# Patient Record
Sex: Male | Born: 1940 | Race: White | Hispanic: No | Marital: Married | Smoking: Former smoker
Health system: Southern US, Community
[De-identification: ages and names within clinical notes are randomized; demographics above are authoritative.]

## PROBLEM LIST (undated history)

## (undated) DIAGNOSIS — M1990 Unspecified osteoarthritis, unspecified site: Secondary | ICD-10-CM

## (undated) DIAGNOSIS — E7800 Pure hypercholesterolemia, unspecified: Secondary | ICD-10-CM

## (undated) DIAGNOSIS — I4891 Unspecified atrial fibrillation: Secondary | ICD-10-CM

## (undated) HISTORY — PX: ROTATOR CUFF REPAIR: SHX139

## (undated) HISTORY — DX: Unspecified osteoarthritis, unspecified site: M19.90

---

## 2000-03-28 ENCOUNTER — Encounter: Payer: Self-pay | Admitting: Family Medicine

## 2000-03-28 ENCOUNTER — Ambulatory Visit (HOSPITAL_COMMUNITY): Admission: RE | Admit: 2000-03-28 | Discharge: 2000-03-28 | Payer: Self-pay | Admitting: Family Medicine

## 2003-04-11 ENCOUNTER — Ambulatory Visit (HOSPITAL_COMMUNITY): Admission: RE | Admit: 2003-04-11 | Discharge: 2003-04-11 | Payer: Self-pay | Admitting: Family Medicine

## 2003-05-06 ENCOUNTER — Emergency Department (HOSPITAL_COMMUNITY): Admission: EM | Admit: 2003-05-06 | Discharge: 2003-05-06 | Payer: Self-pay | Admitting: Emergency Medicine

## 2003-05-16 ENCOUNTER — Ambulatory Visit (HOSPITAL_COMMUNITY): Admission: RE | Admit: 2003-05-16 | Discharge: 2003-05-16 | Payer: Self-pay | Admitting: Cardiology

## 2003-05-23 ENCOUNTER — Ambulatory Visit (HOSPITAL_COMMUNITY): Admission: RE | Admit: 2003-05-23 | Discharge: 2003-05-23 | Payer: Self-pay | Admitting: Cardiology

## 2011-10-23 ENCOUNTER — Encounter (HOSPITAL_COMMUNITY): Payer: Self-pay | Admitting: *Deleted

## 2011-10-23 ENCOUNTER — Emergency Department (HOSPITAL_COMMUNITY): Payer: Medicare Other

## 2011-10-23 ENCOUNTER — Inpatient Hospital Stay (HOSPITAL_COMMUNITY)
Admission: EM | Admit: 2011-10-23 | Discharge: 2011-10-30 | DRG: 329 | Disposition: A | Discharge status: Home Health | Payer: Medicare Other | Attending: Family Medicine | Admitting: Family Medicine

## 2011-10-23 DIAGNOSIS — K658 Other peritonitis: Secondary | ICD-10-CM | POA: Diagnosis present

## 2011-10-23 DIAGNOSIS — R7309 Other abnormal glucose: Secondary | ICD-10-CM | POA: Diagnosis present

## 2011-10-23 DIAGNOSIS — E876 Hypokalemia: Secondary | ICD-10-CM | POA: Diagnosis not present

## 2011-10-23 DIAGNOSIS — Y836 Removal of other organ (partial) (total) as the cause of abnormal reaction of the patient, or of later complication, without mention of misadventure at the time of the procedure: Secondary | ICD-10-CM | POA: Diagnosis not present

## 2011-10-23 DIAGNOSIS — K929 Disease of digestive system, unspecified: Secondary | ICD-10-CM | POA: Diagnosis not present

## 2011-10-23 DIAGNOSIS — K560 Paralytic ileus: Secondary | ICD-10-CM | POA: Diagnosis not present

## 2011-10-23 DIAGNOSIS — N281 Cyst of kidney, acquired: Secondary | ICD-10-CM | POA: Diagnosis present

## 2011-10-23 DIAGNOSIS — R198 Other specified symptoms and signs involving the digestive system and abdomen: Secondary | ICD-10-CM

## 2011-10-23 DIAGNOSIS — M129 Arthropathy, unspecified: Secondary | ICD-10-CM | POA: Diagnosis present

## 2011-10-23 DIAGNOSIS — N200 Calculus of kidney: Secondary | ICD-10-CM | POA: Diagnosis present

## 2011-10-23 DIAGNOSIS — Z7982 Long term (current) use of aspirin: Secondary | ICD-10-CM

## 2011-10-23 DIAGNOSIS — K838 Other specified diseases of biliary tract: Secondary | ICD-10-CM | POA: Diagnosis present

## 2011-10-23 DIAGNOSIS — K5710 Diverticulosis of small intestine without perforation or abscess without bleeding: Principal | ICD-10-CM | POA: Diagnosis present

## 2011-10-23 DIAGNOSIS — E785 Hyperlipidemia, unspecified: Secondary | ICD-10-CM | POA: Diagnosis present

## 2011-10-23 DIAGNOSIS — Z79899 Other long term (current) drug therapy: Secondary | ICD-10-CM

## 2011-10-23 DIAGNOSIS — Z885 Allergy status to narcotic agent status: Secondary | ICD-10-CM

## 2011-10-23 DIAGNOSIS — R69 Illness, unspecified: Secondary | ICD-10-CM | POA: Diagnosis present

## 2011-10-23 DIAGNOSIS — I4891 Unspecified atrial fibrillation: Secondary | ICD-10-CM | POA: Diagnosis present

## 2011-10-23 DIAGNOSIS — A498 Other bacterial infections of unspecified site: Secondary | ICD-10-CM | POA: Diagnosis present

## 2011-10-23 DIAGNOSIS — K529 Noninfective gastroenteritis and colitis, unspecified: Secondary | ICD-10-CM

## 2011-10-23 DIAGNOSIS — R739 Hyperglycemia, unspecified: Secondary | ICD-10-CM | POA: Diagnosis present

## 2011-10-23 HISTORY — DX: Other specified symptoms and signs involving the digestive system and abdomen: R19.8

## 2011-10-23 HISTORY — DX: Pure hypercholesterolemia, unspecified: E78.00

## 2011-10-23 HISTORY — DX: Unspecified atrial fibrillation: I48.91

## 2011-10-23 LAB — BASIC METABOLIC PANEL
BUN: 22 mg/dL (ref 6–23)
CO2: 24 mEq/L (ref 19–32)
Chloride: 101 mEq/L (ref 96–112)
Creatinine, Ser: 0.95 mg/dL (ref 0.50–1.35)
Glucose, Bld: 136 mg/dL — ABNORMAL HIGH (ref 70–99)
Potassium: 3.8 mEq/L (ref 3.5–5.1)

## 2011-10-23 LAB — HEPATIC FUNCTION PANEL
Albumin: 3.5 g/dL (ref 3.5–5.2)
Alkaline Phosphatase: 58 U/L (ref 39–117)
Indirect Bilirubin: 0.9 mg/dL (ref 0.3–0.9)
Total Protein: 6.6 g/dL (ref 6.0–8.3)

## 2011-10-23 LAB — APTT: aPTT: 26 seconds (ref 24–37)

## 2011-10-23 LAB — URINALYSIS, ROUTINE W REFLEX MICROSCOPIC
Bilirubin Urine: NEGATIVE
Glucose, UA: NEGATIVE mg/dL
Hgb urine dipstick: NEGATIVE
Ketones, ur: 40 mg/dL — AB
Nitrite: NEGATIVE
Specific Gravity, Urine: 1.028 (ref 1.005–1.030)
pH: 5 (ref 5.0–8.0)

## 2011-10-23 LAB — LACTIC ACID, PLASMA: Lactic Acid, Venous: 1.4 mmol/L (ref 0.5–2.2)

## 2011-10-23 LAB — CBC WITH DIFFERENTIAL/PLATELET
Basophils Relative: 0 % (ref 0–1)
HCT: 40.7 % (ref 39.0–52.0)
Hemoglobin: 13.8 g/dL (ref 13.0–17.0)
Lymphocytes Relative: 8 % — ABNORMAL LOW (ref 12–46)
Lymphs Abs: 0.7 10*3/uL (ref 0.7–4.0)
MCHC: 33.9 g/dL (ref 30.0–36.0)
Monocytes Absolute: 0.6 10*3/uL (ref 0.1–1.0)
Monocytes Relative: 7 % (ref 3–12)
Neutro Abs: 7.4 10*3/uL (ref 1.7–7.7)
Neutrophils Relative %: 85 % — ABNORMAL HIGH (ref 43–77)
RBC: 4.65 MIL/uL (ref 4.22–5.81)

## 2011-10-23 LAB — PROTIME-INR
INR: 1.05 (ref 0.00–1.49)
Prothrombin Time: 13.9 seconds (ref 11.6–15.2)

## 2011-10-23 MED ORDER — ACETAMINOPHEN 325 MG PO TABS: 650.0000 mg | ORAL_TABLET | Freq: Four times a day (QID) | ORAL | Status: DC | PRN

## 2011-10-23 MED ORDER — ACETAMINOPHEN 650 MG RE SUPP: 650.0000 mg | Freq: Four times a day (QID) | RECTAL | Status: DC | PRN

## 2011-10-23 MED ORDER — METRONIDAZOLE IN NACL 5-0.79 MG/ML-% IV SOLN
500.0000 mg | Freq: Once | INTRAVENOUS | Status: AC
  Administered 2011-10-23: 500 mg via INTRAVENOUS
  Filled 2011-10-23: qty 100

## 2011-10-23 MED ORDER — SODIUM CHLORIDE 0.9 % IJ SOLN
3.0000 mL | Freq: Two times a day (BID) | INTRAMUSCULAR | Status: DC
  Administered 2011-10-27 – 2011-10-29 (×5): 3 mL via INTRAVENOUS

## 2011-10-23 MED ORDER — HYDROMORPHONE HCL PF 1 MG/ML IJ SOLN
1.0000 mg | Freq: Once | INTRAMUSCULAR | Status: AC
  Administered 2011-10-23: 1 mg via INTRAVENOUS
  Filled 2011-10-23: qty 1

## 2011-10-23 MED ORDER — METOPROLOL TARTRATE 1 MG/ML IV SOLN: 5.0000 mg | INTRAVENOUS | Status: DC | PRN

## 2011-10-23 MED ORDER — ONDANSETRON HCL 4 MG/2ML IJ SOLN: 4.0000 mg | Freq: Three times a day (TID) | INTRAMUSCULAR | Status: DC | PRN

## 2011-10-23 MED ORDER — PIPERACILLIN-TAZOBACTAM 3.375 G IVPB
3.3750 g | Freq: Three times a day (TID) | INTRAVENOUS | Status: DC
  Administered 2011-10-24 – 2011-10-27 (×11): 3.375 g via INTRAVENOUS
  Filled 2011-10-23 (×13): qty 50

## 2011-10-23 MED ORDER — HYDROMORPHONE HCL PF 1 MG/ML IJ SOLN: 1.0000 mg | INTRAMUSCULAR | Status: DC | PRN

## 2011-10-23 MED ORDER — PANTOPRAZOLE SODIUM 40 MG IV SOLR
40.0000 mg | INTRAVENOUS | Status: DC
  Administered 2011-10-24 – 2011-10-30 (×6): 40 mg via INTRAVENOUS
  Filled 2011-10-23 (×8): qty 40

## 2011-10-23 MED ORDER — SODIUM CHLORIDE 0.9 % IV SOLN: Freq: Once | INTRAVENOUS | Status: DC

## 2011-10-23 MED ORDER — SODIUM CHLORIDE 0.9 % IV SOLN
INTRAVENOUS | Status: DC
  Administered 2011-10-24: 01:00:00 via INTRAVENOUS

## 2011-10-23 MED ORDER — CIPROFLOXACIN IN D5W 400 MG/200ML IV SOLN
400.0000 mg | Freq: Once | INTRAVENOUS | Status: DC
  Filled 2011-10-23: qty 200

## 2011-10-23 MED ORDER — ONDANSETRON HCL 4 MG/2ML IJ SOLN: 4.0000 mg | Freq: Four times a day (QID) | INTRAMUSCULAR | Status: DC | PRN

## 2011-10-23 MED ORDER — ONDANSETRON HCL 4 MG PO TABS: 4.0000 mg | ORAL_TABLET | Freq: Four times a day (QID) | ORAL | Status: DC | PRN

## 2011-10-23 MED ORDER — LABETALOL HCL 5 MG/ML IV SOLN
10.0000 mg | INTRAVENOUS | Status: DC | PRN
  Filled 2011-10-23: qty 4

## 2011-10-23 MED ORDER — SODIUM CHLORIDE 0.9 % IV SOLN
Freq: Once | INTRAVENOUS | Status: AC
  Administered 2011-10-23: 150 mL/h via INTRAVENOUS

## 2011-10-23 MED ORDER — SODIUM CHLORIDE 0.9 % IV SOLN: INTRAVENOUS | Status: DC

## 2011-10-23 NOTE — ED Provider Notes (Addendum)
History     CSN: [id number]  Arrival date & time 10/23/11  1438   First MD Initiated Contact with Patient 10/23/11 1703      Chief Complaint  Patient presents with  . Abdominal Pain    generalized, radiating to groin, along with back pain    (Consider location/radiation/quality/duration/timing/severity/associated sxs/prior treatment) HPI Comments: 71 y/o with hx of afib, not on anticoagulants comes in with cc of abd pain. He has no hx of abd surgery. Pt reports having abd pain, sudden onset this morning that has worsened over time. The pain is diffuse, but primarily in the lower quadrants and radiating to the groin. There is associated nausea, no emesis and no diarrhea with the last BM was this am. Pt has no bloody stools. No chest pain, sob.  Patient is a 71 y.o. male presenting with abdominal pain. The history is provided by the patient.  Abdominal Pain The primary symptoms of the illness include abdominal pain, nausea and vomiting. The primary symptoms of the illness do not include fever, shortness of breath, diarrhea or dysuria.  Symptoms associated with the illness do not include chills or constipation.    Past Medical History  Diagnosis Date  . Afib   . Hypercholesteremia     No past surgical history on file.  No family history on file.  History  Substance Use Topics  . Smoking status: Not on file  . Smokeless tobacco: Not on file  . Alcohol Use:       Review of Systems  Constitutional: Negative for fever, chills, activity change and appetite change.  HENT: Negative for neck pain.   Eyes: Negative for visual disturbance.  Respiratory: Negative for cough, chest tightness and shortness of breath.   Cardiovascular: Negative for chest pain.  Gastrointestinal: Positive for nausea, vomiting and abdominal pain. Negative for diarrhea, constipation, blood in stool, abdominal distention and rectal pain.  Genitourinary: Negative for dysuria, enuresis and difficulty  urinating.  Musculoskeletal: Negative for arthralgias.  Neurological: Negative for dizziness, light-headedness and headaches.  Psychiatric/Behavioral: Negative for confusion.    Allergies  Codeine  Home Medications   Current Outpatient Rx  Name Route Sig Dispense Refill  . ASPIRIN EC 81 MG PO TBEC Oral Take 81 mg by mouth daily.    . CYCLOBENZAPRINE HCL 10 MG PO TABS Oral Take 10 mg by mouth as needed.    Marland Kitchen METOPROLOL TARTRATE 50 MG PO TABS Oral Take 25 mg by mouth 2 (two) times daily. Takes 1/2 tablet    . NAPROXEN SODIUM 550 MG PO TABS Oral Take 550-1,100 mg by mouth daily.    Marland Kitchen SIMVASTATIN 40 MG PO TABS Oral Take 40 mg by mouth every evening.    Marland Kitchen TAMSULOSIN HCL 0.4 MG PO CAPS Oral Take 0.4 mg by mouth every evening.    Marland Kitchen TRAMADOL HCL 50 MG PO TABS Oral Take 50 mg by mouth every 6 (six) hours as needed. pain      BP 117/70  Pulse 77  Temp 97.4 F (36.3 C) (Oral)  Resp 25  SpO2 95%  Physical Exam  Nursing note and vitals reviewed. Constitutional: He is oriented to person, place, and time. He appears well-developed.  HENT:  Head: Normocephalic and atraumatic.  Eyes: Conjunctivae normal and EOM are normal. Pupils are equal, round, and reactive to light.  Neck: Normal range of motion. Neck supple.  Cardiovascular: Normal rate, regular rhythm and normal heart sounds.   Pulmonary/Chest: Effort normal and breath sounds normal. No  respiratory distress. He has no wheezes.  Abdominal: Bowel sounds are normal. He exhibits distension. He exhibits no mass. There is tenderness. There is guarding. There is no rebound.       Diffuse tenderness, with + mcburney's and positive guarding, no rebound. Firm abdomen  Genitourinary: Penis normal.       No scrotal swelling, rash, no significant tenderness to palpation of the testicle and no testicular masses, no hernia.  Neurological: He is alert and oriented to person, place, and time.  Skin: Skin is warm.    ED Course  Procedures  (including critical care time)  Labs Reviewed  CBC WITH DIFFERENTIAL - Abnormal; Notable for the following:    Neutrophils Relative 85 (*)     Lymphocytes Relative 8 (*)     All other components within normal limits  BASIC METABOLIC PANEL - Abnormal; Notable for the following:    Glucose, Bld 136 (*)     GFR calc non Af Amer 82 (*)     All other components within normal limits  TROPONIN I  PROTIME-INR  APTT  LIPASE, BLOOD  HEPATIC FUNCTION PANEL  URINALYSIS, ROUTINE W REFLEX MICROSCOPIC   Dg Abd Acute W/chest  10/23/2011  *RADIOLOGY REPORT*  Clinical Data: Fever.  Abdominal pain radiating towards the groin.  ACUTE ABDOMEN SERIES (ABDOMEN 2 VIEW & CHEST 1 VIEW)  Comparison: 04/11/2003  Findings: On the chest radiograph, right retro cardiac density favors hiatal hernia.  Tortuous thoracic aorta noted.  There is suspected subsegmental atelectasis at the left lung base.  Faint linear edge noted along the right hemidiaphragm.  Although the appearance is not classic for free intraperitoneal gas, intraperitoneal gas becomes difficult to completely exclude given this appearance.  Accordingly, I recommend either a left side down lateral decubitus view the abdomen or abdomen CT for further characterization.  Gas and stool noted in the colon several air-fluid levels are present in nondilated central abdominal small bowel, nonspecific.  IMPRESSION:  1.  Several nonspecific air-fluid levels and central abdominal loops of nondilated small bowel, query ileus. 2. Faint linear structure extending along the right hemidiaphragm is probably due to clothing and seems too thin to represent the right hemidiaphragm in the setting of free intraperitoneal gas. However, I do recommend a left side down lateral decubitus view of the abdomen for further confirmation that there is no free intraperitoneal gas.  Alternatively, abdomen CT could be utilized. 3.  Retrocardiac density favors hiatal hernia. 4.  Linear subsegmental  atelectasis in the left lower lobe.   Original Report Authenticated By: Dellia Cloud, M.D.      No diagnosis found.    MDM  DDx includes: Pancreatitis Gastritis/PUD SBO ACS syndrome Aortic Dissection Colitis AAA Tumors Colitis Intra abdominal abscess Thrombosis Mesenteric ischemia Diverticulitis Peritonitis Appendicitis Hernia Nephrolithiasis Pyelonephritis UTI/Cystitis  Pt comes in with cc of abd pain. Pt has significant abd tenderness, mostly diffuse, but worst over the RLQ and around the groin area. There is no CVA tenderness.  Initial impression is that patient has perforated viscus, appendicitis, renal stones.  9:32 PM AAS was equivocal, I reassessed the patient, and he was feeling slightly better, and with the groin pain i had a CT with and w/o contrast done as he has hx of renal stones few yrs ago and the groin pain reminded of the renal stones. The Radiologist called [place] stating that there is some free air, and possibly ischhemic bowel. Mesenteric ischemia, especially since he has afib is very high on  the ddx now. i have added lactate to the labs, antibiotics and surgery has been consulted.    Derwood Kaplan, MD 10/23/11 2135  9:59 PM Surgeon (Dr. Daphine Deutscher) on call just called back. Advocating IV AB and hydration with med surg admission, and Surgery on consult. Patient's family notified.      CRITICAL CARE Performed by: Derwood Kaplan   Total critical care time: 62 min  Critical care time was exclusive of separately billable procedures and treating other patients.  Critical care was necessary to treat or prevent imminent or life-threatening deterioration.  Critical care was time spent personally by me on the following activities: development of treatment plan with patient and/or surrogate as well as nursing, discussions with consultants, evaluation of patient's response to treatment, examination of patient, obtaining history from patient  or surrogate, ordering and performing treatments and interventions, ordering and review of laboratory studies, ordering and review of radiographic studies, pulse oximetry and re-evaluation of patient's condition.  Derwood Kaplan, MD 10/23/11 1610  Derwood Kaplan, MD 10/23/11 2222

## 2011-10-23 NOTE — Progress Notes (Signed)
ANTIBIOTIC CONSULT NOTE - INITIAL  Pharmacy Consult for Zosyn Indication: Perforated viscus  Allergies  Allergen Reactions  . Codeine Nausea Only    Patient Measurements: Height: 5\' 9"  (175.3 cm) Weight: 195 lb 8.8 oz (88.7 kg) IBW/kg (Calculated) : 70.7    Vital Signs: Temp: 99.5 F (37.5 C) (09/15 2333) Temp src: Oral (09/15 2333) BP: 132/76 mmHg (09/15 2333) Pulse Rate: 96  (09/15 2333) Intake/Output from previous day:   Intake/Output from this shift:    Labs:  [facility] 10/23/11 1755  WBC 8.7  HGB 13.8  PLT 166  LABCREA --  CREATININE 0.95   Estimated Creatinine Clearance: 79.7 ml/min (by C-G formula based on Cr of 0.95). No results found for this basename: VANCOTROUGH:2,VANCOPEAK:2,VANCORANDOM:2,GENTTROUGH:2,GENTPEAK:2,GENTRANDOM:2,TOBRATROUGH:2,TOBRAPEAK:2,TOBRARND:2,AMIKACINPEAK:2,AMIKACINTROU:2,AMIKACIN:2, in the last 72 hours   Microbiology: No results found for this or any previous visit (from the past 720 hour(s)).  Medical History: Past Medical History  Diagnosis Date  . Afib   . Hypercholesteremia     Medications:  Scheduled:    . sodium chloride   Intravenous Once  . sodium chloride   Intravenous STAT  . ciprofloxacin  400 mg Intravenous Once  .  HYDROmorphone (DILAUDID) injection  1 mg Intravenous Once  .  HYDROmorphone (DILAUDID) injection  1 mg Intravenous Once  . metronidazole  500 mg Intravenous Once  . pantoprazole (PROTONIX) IV  40 mg Intravenous Q24H  . sodium chloride  3 mL Intravenous Q12H  . DISCONTD: sodium chloride   Intravenous Once   Infusions:    . sodium chloride     Assessment:  71 year old male with complaint of abdominal pain  Abdomen pelvis CT shows suspected perforated viscus  Patient has received Flagyl 500mg  IV x 1 in ED @ 22:16  Zosyn ordered empirically for perforated viscus  Surgery has been consulted  Goal of Therapy:  Eradication of suspected infection  Plan:  Zosyn 3.375gm IV q8h (each dose  infused over 4 hrs) Follow renal function, clinical course,  Makenah Karas, Joselyn Glassman, PharmD 10/23/2011,11:51 PM

## 2011-10-23 NOTE — ED Notes (Signed)
WUJ:[id number]<JY> Expected date:10/23/11<BR> Expected time: 2:20 PM<BR> Means of arrival:Ambulance<BR> Comments:<BR> EMS

## 2011-10-23 NOTE — H&P (Signed)
Michael Mejia is an 71 y.o. male. Patient was seen and examined on October 23, 2011 at 11:20 PM.  PCP - Dr. Elias Else.  Cardiologist  - Dr. Viann Fish.  Chief Complaint: Abdominal pain.  HPI: 71 year-old male with history of atrial fibrillation, hyperlipidemia has been experiencing abdominal pain since today morning 10:00. Patient's pain started as a scrotal pain which became more generalized with nausea but no vomiting or diarrhea. 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 Over the last 6 months patient has been having increasing bowel movements at least twice a day after food. Denies any chest pain or shortness of breath. In the ER patient had a CT abdomen pelvis which shows possibility of a perforated viscus and patient has been admitted for further management.  Past Medical History  Diagnosis Date  . Afib   . Hypercholesteremia     Past Surgical History  Procedure Date  . Rotator cuff repair     Family History  Problem Relation Age of Onset  . Pancreatic cancer Mother   . Pancreatic cancer Brother    Social History:  reports that he has never smoked. He does not have any smokeless tobacco history on file. He reports that he does not drink alcohol or use illicit drugs.  Allergies:  Allergies  Allergen Reactions  . Codeine Nausea Only    Medications Prior to Admission  Medication Sig Dispense Refill  . aspirin EC 81 MG tablet Take 81 mg by mouth daily.      . cyclobenzaprine (FLEXERIL) 10 MG tablet Take 10 mg by mouth as needed.      . metoprolol (LOPRESSOR) 50 MG tablet Take 25 mg by mouth 2 (two) times daily. Takes 1/2 tablet      . naproxen sodium (ANAPROX) 550 MG tablet Take 550-1,100 mg by mouth daily.      . simvastatin (ZOCOR) 40 MG tablet Take 40 mg by mouth every evening.      . Tamsulosin HCl (FLOMAX) 0.4 MG CAPS Take 0.4 mg by mouth every evening.      . traMADol (ULTRAM) 50 MG tablet Take 50 mg by mouth every 6  (six) hours as needed. pain        Results for orders placed during the hospital encounter of 10/23/11 (from the past 48 hour(s))  CBC WITH DIFFERENTIAL     Status: Abnormal   Collection Time   10/23/11  5:55 PM      Component Value Range Comment   WBC 8.7  4.0 - 10.5 K/uL    RBC 4.65  4.22 - 5.81 MIL/uL    Hemoglobin 13.8  13.0 - 17.0 g/dL    HCT 40.9  81.1 - 91.4 %    MCV 87.5  78.0 - 100.0 fL    MCH 29.7  26.0 - 34.0 pg    MCHC 33.9  30.0 - 36.0 g/dL    RDW 78.2  95.6 - 21.3 %    Platelets 166  150 - 400 K/uL    Neutrophils Relative 85 (*) 43 - 77 %    Neutro Abs 7.4  1.7 - 7.7 K/uL    Lymphocytes Relative 8 (*) 12 - 46 %    Lymphs Abs 0.7  0.7 - 4.0 K/uL    Monocytes Relative 7  3 - 12 %    Monocytes Absolute 0.6  0.1 - 1.0 K/uL    Eosinophils Relative 0  0 - 5 %  Eosinophils Absolute 0.0  0.0 - 0.7 K/uL    Basophils Relative 0  0 - 1 %    Basophils Absolute 0.0  0.0 - 0.1 K/uL   BASIC METABOLIC PANEL     Status: Abnormal   Collection Time   10/23/11  5:55 PM      Component Value Range Comment   Sodium 136  135 - 145 mEq/L    Potassium 3.8  3.5 - 5.1 mEq/L    Chloride 101  96 - 112 mEq/L    CO2 24  19 - 32 mEq/L    Glucose, Bld 136 (*) 70 - 99 mg/dL    BUN 22  6 - 23 mg/dL    Creatinine, Ser 1.61  0.50 - 1.35 mg/dL    Calcium 8.9  8.4 - 09.6 mg/dL    GFR calc non Af Amer 82 (*) >90 mL/min    GFR calc Af Amer >90  >90 mL/min   TROPONIN I     Status: Normal   Collection Time   10/23/11  5:55 PM      Component Value Range Comment   Troponin I <0.30  <0.30 ng/mL   PROTIME-INR     Status: Normal   Collection Time   10/23/11  5:55 PM      Component Value Range Comment   Prothrombin Time 13.9  11.6 - 15.2 seconds    INR 1.05  0.00 - 1.49   APTT     Status: Normal   Collection Time   10/23/11  5:55 PM      Component Value Range Comment   aPTT 26  24 - 37 seconds   LIPASE, BLOOD     Status: Normal   Collection Time   10/23/11  5:55 PM      Component Value Range  Comment   Lipase 41  11 - 59 U/L   HEPATIC FUNCTION PANEL     Status: Normal   Collection Time   10/23/11  5:55 PM      Component Value Range Comment   Total Protein 6.6  6.0 - 8.3 g/dL    Albumin 3.5  3.5 - 5.2 g/dL    AST 18  0 - 37 U/L    ALT 10  0 - 53 U/L    Alkaline Phosphatase 58  39 - 117 U/L    Total Bilirubin 1.1  0.3 - 1.2 mg/dL    Bilirubin, Direct 0.2  0.0 - 0.3 mg/dL    Indirect Bilirubin 0.9  0.3 - 0.9 mg/dL   URINALYSIS, ROUTINE W REFLEX MICROSCOPIC     Status: Abnormal   Collection Time   10/23/11  7:40 PM      Component Value Range Comment   Color, Urine YELLOW  YELLOW    APPearance CLOUDY (*) CLEAR    Specific Gravity, Urine 1.028  1.005 - 1.030    pH 5.0  5.0 - 8.0    Glucose, UA NEGATIVE  NEGATIVE mg/dL    Hgb urine dipstick NEGATIVE  NEGATIVE    Bilirubin Urine NEGATIVE  NEGATIVE    Ketones, ur 40 (*) NEGATIVE mg/dL    Protein, ur NEGATIVE  NEGATIVE mg/dL    Urobilinogen, UA 0.2  0.0 - 1.0 mg/dL    Nitrite NEGATIVE  NEGATIVE    Leukocytes, UA NEGATIVE  NEGATIVE MICROSCOPIC NOT DONE ON URINES WITH NEGATIVE PROTEIN, BLOOD, LEUKOCYTES, NITRITE, OR GLUCOSE <1000 mg/dL.  LACTIC ACID, PLASMA     Status: Normal  Collection Time   10/23/11 10:00 PM      Component Value Range Comment   Lactic Acid, Venous 1.4  0.5 - 2.2 mmol/L    Ct Abdomen Pelvis Wo Contrast  10/23/2011  *RADIOLOGY REPORT*  Clinical Data: Right lower quadrant pain.  CT ABDOMEN AND PELVIS WITHOUT CONTRAST  Technique:  Multidetector CT imaging of the abdomen and pelvis was performed following the standard protocol without intravenous contrast.  Comparison: Chest and two views abdomen 10/23/2011 at 1804 hours.  Findings: Lung bases demonstrate some dependent atelectatic change. No pleural or pericardial effusion.  There is cardiomegaly.  Oral contrast material in the distal esophagus could be due to poor motility and/or reflux.  There is wall thickening and inflammatory change about a loop of jejunum in  the midline immediately anterior to the right common iliac artery.  There are a tiny locules of free air adjacent to this abnormal loop of small bowel.  There is some free fluid present.  The no pneumatosis or portal venous gas is identified. No focal fluid collection is identified.  There appear be some tiny gravel type stones layering dependently within the gallbladder.  The gallbladder is otherwise unremarkable. The liver, spleen, adrenal glands and pancreas appear normal.  A punctate nonobstructing stone is identified in the lower pole of the right kidney.  There is an exophytic lesion off the midpole of the left kidney measuring 1.9 cm with Hounsfield unit measurements of 22.2 which cannot be definitively characterized.  An additional smaller intraparenchymal low attenuating lesion is seen in the left kidney which also cannot be characterized.  The colon and appendix appear normal.  IMPRESSION:  1.  Free air within the abdomen with fluid present compatible with bowel perforation.  Free air is centered about a loop of jejunum which has thickened walls.  Differential considerations include inflammatory change and ischemia. 2.  Indeterminate lesion lower pole left kidney.  Non emergent renal ultrasound recommended for further characterization. 3.  Punctate nonobstructing stone lower pole right kidney. 4.  Likely gravel type gallstones without evidence of cholecystitis.  Critical Value/emergent results were called by telephone at the time of interpretation on 10/23/2011 at 9:20 p.m. to Dr. Rhunette Croft, who verbally acknowledged these results.   Original Report Authenticated By: Bernadene Bell. D'ALESSIO, M.D.    Dg Abd Acute W/chest  10/23/2011  *RADIOLOGY REPORT*  Clinical Data: Fever.  Abdominal pain radiating towards the groin.  ACUTE ABDOMEN SERIES (ABDOMEN 2 VIEW & CHEST 1 VIEW)  Comparison: 04/11/2003  Findings: On the chest radiograph, right retro cardiac density favors hiatal hernia.  Tortuous thoracic aorta  noted.  There is suspected subsegmental atelectasis at the left lung base.  Faint linear edge noted along the right hemidiaphragm.  Although the appearance is not classic for free intraperitoneal gas, intraperitoneal gas becomes difficult to completely exclude given this appearance.  Accordingly, I recommend either a left side down lateral decubitus view the abdomen or abdomen CT for further characterization.  Gas and stool noted in the colon several air-fluid levels are present in nondilated central abdominal small bowel, nonspecific.  IMPRESSION:  1.  Several nonspecific air-fluid levels and central abdominal loops of nondilated small bowel, query ileus. 2. Faint linear structure extending along the right hemidiaphragm is probably due to clothing and seems too thin to represent the right hemidiaphragm in the setting of free intraperitoneal gas. However, I do recommend a left side down lateral decubitus view of the abdomen for further confirmation that there is no free  intraperitoneal gas.  Alternatively, abdomen CT could be utilized. 3.  Retrocardiac density favors hiatal hernia. 4.  Linear subsegmental atelectasis in the left lower lobe.   Original Report Authenticated By: Dellia Cloud, M.D.     Review of Systems  Constitutional: Negative.   HENT: Negative.   Eyes: Negative.   Respiratory: Negative.   Cardiovascular: Negative.   Gastrointestinal: Positive for nausea and abdominal pain.  Genitourinary: Negative.   Musculoskeletal: Negative.   Skin: Negative.   Neurological: Negative.   Endo/Heme/Allergies: Negative.   Psychiatric/Behavioral: Negative.     Blood pressure 132/76, pulse 96, temperature 99.5 F (37.5 C), temperature source Oral, resp. rate 22, height 5\' 9"  (1.753 m), weight 88.7 kg (195 lb 8.8 oz), SpO2 93.00%. Physical Exam  Constitutional: He is oriented to person, place, and time. He appears well-developed and well-nourished. No distress.  HENT:  Head: Normocephalic  and atraumatic.  Right Ear: External ear normal.  Left Ear: External ear normal.  Mouth/Throat: No oropharyngeal exudate.  Eyes: Conjunctivae normal are normal. Pupils are equal, round, and reactive to light. Right eye exhibits no discharge. Left eye exhibits no discharge. No scleral icterus.  Neck: Normal range of motion. Neck supple.  Cardiovascular: Normal rate and regular rhythm.   Respiratory: Effort normal and breath sounds normal. No respiratory distress. He has no wheezes. He has no rales.  GI: He exhibits no distension. There is tenderness. There is guarding.       Rigid.  Musculoskeletal: Normal range of motion. He exhibits no edema and no tenderness.  Neurological: He is alert and oriented to person, place, and time.       Moves all extremities.  Skin: He is not diaphoretic.     Assessment/Plan #1. Abdominal pain CAT scan showing possibly a perforated viscus - patient will be kept n.p.o. Surgery on call Dr. Daphine Deutscher has been consulted. Patient will be kept on empiric antibiotics. IV fluids and pain in his medications. Further recommendations per surgery. #2. History of atrial fibrillation presently rate controlled - patient will be placed on when necessary metoprolol for heart rate more than 110. #3. History of hyperlipidemia - hold statins for now. #4. Renal stones and indeterminant left kidney lesion with gallbladder sludge - check abdominal sonogram. UA is unremarkable.  CODE STATUS - full code.  Eduard Clos. 10/23/2011, 11:44 PM

## 2011-10-23 NOTE — ED Notes (Signed)
Per [facility], generalized abdominal pain 8/10, radiating to groin, along with back pain. Per report, went to Urgent Care and staff on site called EMS. Pain worsening with palpation and with movement. Pt was nauseous and diaphoretic on ambulance ride.

## 2011-10-23 NOTE — ED Notes (Signed)
Attempt to call report x 1 to [facility] [address]

## 2011-10-24 ENCOUNTER — Inpatient Hospital Stay (HOSPITAL_COMMUNITY): Payer: Medicare Other

## 2011-10-24 ENCOUNTER — Inpatient Hospital Stay (HOSPITAL_COMMUNITY): Payer: Medicare Other | Admitting: Anesthesiology

## 2011-10-24 ENCOUNTER — Encounter (HOSPITAL_COMMUNITY)
Admission: EM | Disposition: A | Discharge status: Home Health | Payer: Self-pay | Source: Home / Self Care | Attending: Family Medicine

## 2011-10-24 ENCOUNTER — Encounter (HOSPITAL_COMMUNITY): Payer: Self-pay | Admitting: Anesthesiology

## 2011-10-24 DIAGNOSIS — K5289 Other specified noninfective gastroenteritis and colitis: Secondary | ICD-10-CM

## 2011-10-24 DIAGNOSIS — I4891 Unspecified atrial fibrillation: Secondary | ICD-10-CM

## 2011-10-24 DIAGNOSIS — K631 Perforation of intestine (nontraumatic): Secondary | ICD-10-CM

## 2011-10-24 DIAGNOSIS — R109 Unspecified abdominal pain: Secondary | ICD-10-CM

## 2011-10-24 DIAGNOSIS — N200 Calculus of kidney: Secondary | ICD-10-CM

## 2011-10-24 HISTORY — PX: LAPAROTOMY: SHX154

## 2011-10-24 HISTORY — PX: BOWEL RESECTION: SHX1257

## 2011-10-24 LAB — COMPREHENSIVE METABOLIC PANEL
ALT: 8 U/L (ref 0–53)
Alkaline Phosphatase: 44 U/L (ref 39–117)
BUN: 20 mg/dL (ref 6–23)
CO2: 24 mEq/L (ref 19–32)
Chloride: 102 mEq/L (ref 96–112)
GFR calc Af Amer: 85 mL/min — ABNORMAL LOW (ref >90)
GFR calc non Af Amer: 73 mL/min — ABNORMAL LOW (ref >90)
Glucose, Bld: 146 mg/dL — ABNORMAL HIGH (ref 70–99)
Potassium: 3.9 mEq/L (ref 3.5–5.1)
Sodium: 136 mEq/L (ref 135–145)
Total Bilirubin: 1.6 mg/dL — ABNORMAL HIGH (ref 0.3–1.2)

## 2011-10-24 LAB — CBC WITH DIFFERENTIAL/PLATELET
Basophils Absolute: 0 10*3/uL (ref 0.0–0.1)
Eosinophils Absolute: 0 10*3/uL (ref 0.0–0.7)
HCT: 38.5 % — ABNORMAL LOW (ref 39.0–52.0)
Lymphocytes Relative: 10 % — ABNORMAL LOW (ref 12–46)
MCHC: 33.5 g/dL (ref 30.0–36.0)
Monocytes Relative: 7 % (ref 3–12)
Neutro Abs: 5.4 10*3/uL (ref 1.7–7.7)
Platelets: 141 10*3/uL — ABNORMAL LOW (ref 150–400)
RDW: 14 % (ref 11.5–15.5)
WBC: 6.6 10*3/uL (ref 4.0–10.5)

## 2011-10-24 LAB — GLUCOSE, CAPILLARY
Glucose-Capillary: 181 mg/dL — ABNORMAL HIGH (ref 70–99)
Glucose-Capillary: 150 mg/dL — ABNORMAL HIGH (ref 70–99)

## 2011-10-24 SURGERY — LAPAROTOMY, EXPLORATORY
Anesthesia: General | Site: Abdomen | Wound class: Dirty or Infected

## 2011-10-24 MED ORDER — FENTANYL CITRATE 0.05 MG/ML IJ SOLN
INTRAMUSCULAR | Status: DC | PRN
  Administered 2011-10-24: 100 ug via INTRAVENOUS
  Administered 2011-10-24: 25 ug via INTRAVENOUS
  Administered 2011-10-24 (×2): 50 ug via INTRAVENOUS
  Administered 2011-10-24: 25 ug via INTRAVENOUS

## 2011-10-24 MED ORDER — HEPARIN SODIUM (PORCINE) 5000 UNIT/ML IJ SOLN
5000.0000 [IU] | Freq: Three times a day (TID) | INTRAMUSCULAR | Status: DC
  Administered 2011-10-24 – 2011-10-30 (×18): 5000 [IU] via SUBCUTANEOUS
  Filled 2011-10-24 (×21): qty 1

## 2011-10-24 MED ORDER — OXYCODONE HCL 5 MG/5ML PO SOLN: 5.0000 mg | Freq: Once | ORAL | Status: DC | PRN

## 2011-10-24 MED ORDER — 0.9 % SODIUM CHLORIDE (POUR BTL) OPTIME
TOPICAL | Status: DC | PRN
  Administered 2011-10-24: 4000 mL

## 2011-10-24 MED ORDER — GLYCOPYRROLATE 0.2 MG/ML IJ SOLN
INTRAMUSCULAR | Status: DC | PRN
  Administered 2011-10-24: .5 mg via INTRAVENOUS

## 2011-10-24 MED ORDER — DIPHENHYDRAMINE HCL 50 MG/ML IJ SOLN: 12.5000 mg | Freq: Four times a day (QID) | INTRAMUSCULAR | Status: DC | PRN

## 2011-10-24 MED ORDER — ROCURONIUM BROMIDE 100 MG/10ML IV SOLN
INTRAVENOUS | Status: DC | PRN
  Administered 2011-10-24: 20 mg via INTRAVENOUS

## 2011-10-24 MED ORDER — NALOXONE HCL 0.4 MG/ML IJ SOLN: 0.4000 mg | INTRAMUSCULAR | Status: DC | PRN

## 2011-10-24 MED ORDER — OXYCODONE HCL 5 MG PO TABS: 5.0000 mg | ORAL_TABLET | Freq: Once | ORAL | Status: AC | PRN

## 2011-10-24 MED ORDER — SUCCINYLCHOLINE CHLORIDE 20 MG/ML IJ SOLN
INTRAMUSCULAR | Status: DC | PRN
  Administered 2011-10-24: 100 mg via INTRAVENOUS

## 2011-10-24 MED ORDER — SODIUM CHLORIDE 0.9 % IJ SOLN: 9.0000 mL | INTRAMUSCULAR | Status: DC | PRN

## 2011-10-24 MED ORDER — MORPHINE SULFATE (PF) 1 MG/ML IV SOLN
INTRAVENOUS | Status: DC
  Administered 2011-10-24 (×2): via INTRAVENOUS
  Filled 2011-10-24: qty 25

## 2011-10-24 MED ORDER — KCL IN DEXTROSE-NACL 20-5-0.45 MEQ/L-%-% IV SOLN
INTRAVENOUS | Status: AC
  Administered 2011-10-24 – 2011-10-25 (×2): via INTRAVENOUS
  Filled 2011-10-24 (×3): qty 1000

## 2011-10-24 MED ORDER — METOPROLOL TARTRATE 1 MG/ML IV SOLN: 5.0000 mg | INTRAVENOUS | Status: DC | PRN

## 2011-10-24 MED ORDER — ONDANSETRON HCL 4 MG/2ML IJ SOLN
INTRAMUSCULAR | Status: DC | PRN
  Administered 2011-10-24: 4 mg via INTRAVENOUS

## 2011-10-24 MED ORDER — NEOSTIGMINE METHYLSULFATE 1 MG/ML IJ SOLN
INTRAMUSCULAR | Status: DC | PRN
  Administered 2011-10-24: 4 mg via INTRAVENOUS

## 2011-10-24 MED ORDER — HYDROMORPHONE HCL PF 1 MG/ML IJ SOLN: 0.2500 mg | INTRAMUSCULAR | Status: DC | PRN

## 2011-10-24 MED ORDER — MEPERIDINE HCL 25 MG/ML IJ SOLN: 6.2500 mg | INTRAMUSCULAR | Status: DC | PRN

## 2011-10-24 MED ORDER — PROMETHAZINE HCL 25 MG/ML IJ SOLN: 6.2500 mg | INTRAMUSCULAR | Status: DC | PRN

## 2011-10-24 MED ORDER — KCL IN DEXTROSE-NACL 20-5-0.45 MEQ/L-%-% IV SOLN
INTRAVENOUS | Status: DC
  Administered 2011-10-24: 05:00:00 via INTRAVENOUS
  Filled 2011-10-24 (×2): qty 1000

## 2011-10-24 MED ORDER — PHENYLEPHRINE HCL 10 MG/ML IJ SOLN
INTRAMUSCULAR | Status: DC | PRN
  Administered 2011-10-24: 100 ug via INTRAVENOUS

## 2011-10-24 MED ORDER — LACTATED RINGERS IV SOLN
INTRAVENOUS | Status: DC | PRN
  Administered 2011-10-24: 02:00:00 via INTRAVENOUS

## 2011-10-24 MED ORDER — MORPHINE SULFATE (PF) 1 MG/ML IV SOLN
INTRAVENOUS | Status: AC
  Administered 2011-10-24: 20 mg via INTRAVENOUS
  Filled 2011-10-24: qty 25

## 2011-10-24 MED ORDER — MORPHINE SULFATE (PF) 1 MG/ML IV SOLN
INTRAVENOUS | Status: DC
  Administered 2011-10-24: 22.81 mg via INTRAVENOUS
  Administered 2011-10-24: 13:00:00 via INTRAVENOUS
  Administered 2011-10-25: 5 mg via INTRAVENOUS
  Filled 2011-10-24: qty 25

## 2011-10-24 MED ORDER — DIPHENHYDRAMINE HCL 12.5 MG/5ML PO ELIX: 12.5000 mg | ORAL_SOLUTION | Freq: Four times a day (QID) | ORAL | Status: DC | PRN

## 2011-10-24 MED ORDER — PROPOFOL 10 MG/ML IV BOLUS
INTRAVENOUS | Status: DC | PRN
  Administered 2011-10-24: 100 mg via INTRAVENOUS

## 2011-10-24 MED ORDER — ACETAMINOPHEN 10 MG/ML IV SOLN
1000.0000 mg | Freq: Once | INTRAVENOUS | Status: AC | PRN
  Filled 2011-10-24: qty 100

## 2011-10-24 MED ORDER — ONDANSETRON HCL 4 MG/2ML IJ SOLN: 4.0000 mg | Freq: Four times a day (QID) | INTRAMUSCULAR | Status: DC | PRN

## 2011-10-24 SURGICAL SUPPLY — 44 items
APPLICATOR COTTON TIP 6IN STRL (MISCELLANEOUS) IMPLANT
BLADE EXTENDED COATED 6.5IN (ELECTRODE) IMPLANT
BLADE HEX COATED 2.75 (ELECTRODE) ×3 IMPLANT
CANISTER SUCTION 2500CC (MISCELLANEOUS) ×3 IMPLANT
CLOTH BEACON ORANGE TIMEOUT ST (SAFETY) ×3 IMPLANT
COVER MAYO STAND STRL (DRAPES) ×3 IMPLANT
DRAPE LAPAROSCOPIC ABDOMINAL (DRAPES) ×3 IMPLANT
DRAPE WARM FLUID 44X44 (DRAPE) ×3 IMPLANT
ELECT REM PT RETURN 9FT ADLT (ELECTROSURGICAL) ×3
ELECTRODE REM PT RTRN 9FT ADLT (ELECTROSURGICAL) ×2 IMPLANT
GLOVE BIOGEL M 8.0 STRL (GLOVE) ×3 IMPLANT
GLOVE BIOGEL PI IND STRL 7.0 (GLOVE) ×2 IMPLANT
GLOVE BIOGEL PI INDICATOR 7.0 (GLOVE) ×1
GOWN STRL NON-REIN LRG LVL3 (GOWN DISPOSABLE) ×3 IMPLANT
GOWN STRL REIN XL XLG (GOWN DISPOSABLE) ×6 IMPLANT
KIT BASIN OR (CUSTOM PROCEDURE TRAY) ×3 IMPLANT
LIGASURE IMPACT 36 18CM CVD LR (INSTRUMENTS) ×3 IMPLANT
NS IRRIG 1000ML POUR BTL (IV SOLUTION) ×6 IMPLANT
PACK GENERAL/GYN (CUSTOM PROCEDURE TRAY) ×3 IMPLANT
RELOAD PROXIMATE 75MM BLUE (ENDOMECHANICALS) ×6 IMPLANT
SPONGE GAUZE 4X4 12PLY (GAUZE/BANDAGES/DRESSINGS) IMPLANT
SPONGE LAP 18X18 X RAY DECT (DISPOSABLE) IMPLANT
STAPLER PROXIMATE 75MM BLUE (STAPLE) ×3 IMPLANT
STAPLER VISISTAT 35W (STAPLE) ×3 IMPLANT
SUCTION POOLE TIP (SUCTIONS) ×3 IMPLANT
SUT PDS AB 0 CTX 60 (SUTURE) ×6 IMPLANT
SUT PDS AB 1 CTX 36 (SUTURE) IMPLANT
SUT PDS AB 1 TP1 96 (SUTURE) ×6 IMPLANT
SUT PDS AB 4-0 SH 27 (SUTURE) ×9 IMPLANT
SUT SILK 2 0 (SUTURE) ×1
SUT SILK 2 0 SH CR/8 (SUTURE) ×3 IMPLANT
SUT SILK 2-0 18XBRD TIE 12 (SUTURE) ×2 IMPLANT
SUT SILK 3 0 (SUTURE)
SUT SILK 3 0 SH CR/8 (SUTURE) ×6 IMPLANT
SUT SILK 3-0 18XBRD TIE 12 (SUTURE) IMPLANT
SUT VIC AB 4-0 SH 18 (SUTURE) ×3 IMPLANT
SUT VICRYL 2 0 18  UND BR (SUTURE)
SUT VICRYL 2 0 18 UND BR (SUTURE) IMPLANT
SWAB COLLECTION DEVICE MRSA (MISCELLANEOUS) ×3 IMPLANT
TAPE CLOTH SURG 4X10 WHT LF (GAUZE/BANDAGES/DRESSINGS) ×3 IMPLANT
TOWEL OR 17X26 10 PK STRL BLUE (TOWEL DISPOSABLE) ×3 IMPLANT
TRAY FOLEY CATH 14FRSI W/METER (CATHETERS) ×3 IMPLANT
TUBE ANAEROBIC SPECIMEN COL (MISCELLANEOUS) ×3 IMPLANT
YANKAUER SUCT BULB TIP NO VENT (SUCTIONS) ×3 IMPLANT

## 2011-10-24 NOTE — Anesthesia Preprocedure Evaluation (Addendum)
Anesthesia Evaluation  Patient identified by MRN, date of birth, ID band Patient awake    Reviewed: Allergy & Precautions, H&P , NPO status , Patient's Chart, lab work & pertinent test results, reviewed documented beta blocker date and time   Airway Mallampati: III TM Distance: >3 FB Neck ROM: Full    Dental  (+) Teeth Intact and Dental Advisory Given   Pulmonary neg pulmonary ROS,  breath sounds clear to auscultation  Pulmonary exam normal       Cardiovascular hypertension, Pt. on home beta blockers and Pt. on medications + dysrhythmias Atrial Fibrillation Rhythm:Irregular Rate:Tachycardia     Neuro/Psych negative neurological ROS     GI/Hepatic negative GI ROS, Neg liver ROS,   Endo/Other  negative endocrine ROS  Renal/GU negative Renal ROS     Musculoskeletal negative musculoskeletal ROS (+)   Abdominal   Peds  Hematology negative hematology ROS (+)   Anesthesia Other Findings   Reproductive/Obstetrics                         Anesthesia Physical Anesthesia Plan  ASA: III and Emergent  Anesthesia Plan: General   Post-op Pain Management:    Induction: Intravenous  Airway Management Planned: Oral ETT  Additional Equipment:   Intra-op Plan:   Post-operative Plan: Extubation in OR  Informed Consent: I have reviewed the patients History and Physical, chart, labs and discussed the procedure including the risks, benefits and alternatives for the proposed anesthesia with the patient or authorized representative who has indicated his/her understanding and acceptance.   Dental advisory given  Plan Discussed with: CRNA and Surgeon  Anesthesia Plan Comments:         Anesthesia Quick Evaluation

## 2011-10-24 NOTE — Plan of Care (Signed)
Problem: Phase I Progression Outcomes Goal: Voiding-avoid urinary catheter unless indicated Outcome: Not Met (add Reason) Pt has f/c to be dcd pod2

## 2011-10-24 NOTE — Progress Notes (Signed)
[facility]                                                                                Patient Demographics  Taurus Majkut, is a 71 y.o. male  VHQ:[id number]  [id number]  DOB - 03/14/1940  Admit date - 10/23/2011  Admitting Physician Eduard Clos, MD  Outpatient Primary MD for the patient is No primary provider on file.  LOS - 1   Chief Complaint  Patient presents with  . Abdominal Pain    generalized, radiating to groin, along with back pain        Assessment & Plan    1. 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 Daphine Deutscher general surgery - plan is to continue empiric IV Zosyn, continued n.p.o., NG tube for decompression, general surgery following closely.   2. Incidental finding of non-obstructing renal stone and questionable lesion on the kidney on CT scan- get a renal ultrasound better define the renal lesion noted, thereafter outpatient urology follow up.    3. History of atrial fibrillation - patient on Lopressor and aspirin at home, currently unable to take due to n.p.o. status for #1 above, when necessary IV Lopressor ordered, if it becomes an issue we'll try IV digoxin or IV drip beta blocker if needed.    4. History of dyslipidemia - resume home dose Zocor once taking by mouth.    Code Status: Full  Family Communication: With the patient bedside on 10/24/2011  Disposition Plan: TBD    Procedures   1. On 10/23/2011 by Dr. Daphine Deutscher general surgery patient had Exploratory laparotomy with resection of perforated small bowel diverticula with primary anastomosis.  2. CT abdomen and pelvis  3. Renal ultrasound   Prognosis remains guarded.   Consults  Gen. surgery   Time Spent in minutes   35   Antibiotics    Anti-infectives     Start     Dose/Rate Route  Frequency Ordered Stop   10/24/11 0000  piperacillin-tazobactam (ZOSYN) IVPB 3.375 g       3.375 g 12.5 mL/hr over 240 Minutes Intravenous Every 8 hours 10/23/11 2356     10/23/11 2130   ciprofloxacin (CIPRO) IVPB 400 mg  Status:  Discontinued        400 mg 200 mL/hr over 60 Minutes Intravenous  Once 10/23/11 2121 10/24/11 0254   10/23/11 2130   metroNIDAZOLE (FLAGYL) IVPB 500 mg        500 mg 100 mL/hr over 60 Minutes Intravenous  Once 10/23/11 2121 10/23/11 2316          Scheduled Meds:   . sodium chloride   Intravenous Once  . heparin  5,000 Units Subcutaneous Q8H  .  HYDROmorphone (DILAUDID) injection  1 mg Intravenous Once  .  HYDROmorphone (DILAUDID) injection  1 mg Intravenous Once  . metronidazole  500 mg Intravenous Once  . pantoprazole (PROTONIX) IV  40 mg Intravenous Q24H  . piperacillin-tazobactam (ZOSYN)  IV  3.375 g Intravenous Q8H  . sodium chloride  3 mL Intravenous Q12H  . DISCONTD: sodium  chloride   Intravenous Once  . DISCONTD: sodium chloride   Intravenous STAT  . DISCONTD: ciprofloxacin  400 mg Intravenous Once  . DISCONTD: morphine   Intravenous Q4H   Continuous Infusions:   . dextrose 5 % and 0.45 % NaCl with KCl 20 mEq/L    . DISCONTD: sodium chloride Stopped (10/24/11 0147)  . DISCONTD: dextrose 5 % and 0.45 % NaCl with KCl 20 mEq/L 125 mL/hr at 10/24/11 0510   PRN Meds:.acetaminophen, acetaminophen, HYDROmorphone (DILAUDID) injection, metoprolol, oxyCODONE, DISCONTD: 0.9 % irrigation (POUR BTL), DISCONTD: acetaminophen, DISCONTD: diphenhydrAMINE, DISCONTD: diphenhydrAMINE, DISCONTD:  HYDROmorphone (DILAUDID) injection, DISCONTD:  HYDROmorphone (DILAUDID) injection, DISCONTD: labetalol, DISCONTD: meperidine (DEMEROL) injection, DISCONTD: metoprolol, DISCONTD: naloxone, DISCONTD: ondansetron (ZOFRAN) IV DISCONTD: ondansetron (ZOFRAN) IV, DISCONTD: ondansetron (ZOFRAN) IV, DISCONTD: ondansetron, DISCONTD: oxyCODONE, DISCONTD: promethazine, DISCONTD:  sodium chloride   DVT Prophylaxis   Heparin     Susa Raring K M.D on 10/24/2011 at 10:29 AM  Between 7am to 7pm - Pager - [phone]  After 7pm go to [email or web address] - password [email or web address]  And look for the night coverage person covering for me after hours  [facility] Office  [phone]    Subjective:   Zyden Suman today has, No headache, No chest pain, mild generalized abdominal pain - No Nausea, No new weakness tingling or numbness, No Cough - SOB.   Objective:   Filed Vitals:   10/24/11 0510 10/24/11 0610 10/24/11 0700 10/24/11 0805  BP: 110/62 108/73 108/66   Pulse: 94 101 96   Temp: 99 F (37.2 C) 98.6 F (37 C) 98.6 F (37 C)   TempSrc:   Oral   Resp: 13 17 16 17   Height:      Weight:      SpO2: 95% 94% 93% 96%    Wt Readings from Last 3 Encounters:  10/23/11 88.7 kg (195 lb 8.8 oz)  10/23/11 88.7 kg (195 lb 8.8 oz)     Intake/Output Summary (Last 24 hours) at 10/24/11 1029 Last data filed at 10/24/11 0600  Gross per 24 hour  Intake 2034.17 ml  Output    700 ml  Net 1334.17 ml    Exam Awake Alert, Oriented X 3, No new F.N deficits, Normal affect [place].AT,PERRAL Supple Neck,No JVD, No cervical lymphadenopathy appriciated.  Symmetrical Chest wall movement, Good air movement bilaterally, CTAB RRR,No Gallops,Rubs or new Murmurs, No Parasternal Heave No  B.Sounds, Abd Soft, some generalized tenderness, No organomegaly appriciated, No rebound or rigidity. No Cyanosis, Clubbing or edema, No new Rash or bruise     Data Review   Micro Results No results found for this or any previous visit (from the past 240 hour(s)).  Radiology Reports Ct Abdomen Pelvis Wo Contrast  10/23/2011  *RADIOLOGY REPORT*  Clinical Data: Right lower quadrant pain.  CT ABDOMEN AND PELVIS WITHOUT CONTRAST  Technique:  Multidetector CT imaging of the abdomen and pelvis was performed following the standard protocol without intravenous contrast.  Comparison: Chest and two  views abdomen 10/23/2011 at 1804 hours.  Findings: Lung bases demonstrate some dependent atelectatic change. No pleural or pericardial effusion.  There is cardiomegaly.  Oral contrast material in the distal esophagus could be due to poor motility and/or reflux.  There is wall thickening and inflammatory change about a loop of jejunum in the midline immediately anterior to the right common iliac artery.  There are a tiny locules of free air adjacent to this abnormal loop of small bowel.  There is some free fluid present.  The no pneumatosis or portal venous gas is identified. No focal fluid collection is identified.  There appear be some tiny gravel type stones layering dependently within the gallbladder.  The gallbladder is otherwise unremarkable. The liver, spleen, adrenal glands and pancreas appear normal.  A punctate nonobstructing stone is identified in the lower pole of the right kidney.  There is an exophytic lesion off the midpole of the left kidney measuring 1.9 cm with Hounsfield unit measurements of 22.2 which cannot be definitively characterized.  An additional smaller intraparenchymal low attenuating lesion is seen in the left kidney which also cannot be characterized.  The colon and appendix appear normal.  IMPRESSION:  1.  Free air within the abdomen with fluid present compatible with bowel perforation.  Free air is centered about a loop of jejunum which has thickened walls.  Differential considerations include inflammatory change and ischemia. 2.  Indeterminate lesion lower pole left kidney.  Non emergent renal ultrasound recommended for further characterization. 3.  Punctate nonobstructing stone lower pole right kidney. 4.  Likely gravel type gallstones without evidence of cholecystitis.  Critical Value/emergent results were called by telephone at the time of interpretation on 10/23/2011 at 9:20 p.m. to Dr. Rhunette Croft, who verbally acknowledged these results.   Original Report Authenticated By: Bernadene Bell. D'ALESSIO, M.D.    Dg Abd Acute W/chest  10/23/2011  *RADIOLOGY REPORT*  Clinical Data: Fever.  Abdominal pain radiating towards the groin.  ACUTE ABDOMEN SERIES (ABDOMEN 2 VIEW & CHEST 1 VIEW)  Comparison: 04/11/2003  Findings: On the chest radiograph, right retro cardiac density favors hiatal hernia.  Tortuous thoracic aorta noted.  There is suspected subsegmental atelectasis at the left lung base.  Faint linear edge noted along the right hemidiaphragm.  Although the appearance is not classic for free intraperitoneal gas, intraperitoneal gas becomes difficult to completely exclude given this appearance.  Accordingly, I recommend either a left side down lateral decubitus view the abdomen or abdomen CT for further characterization.  Gas and stool noted in the colon several air-fluid levels are present in nondilated central abdominal small bowel, nonspecific.  IMPRESSION:  1.  Several nonspecific air-fluid levels and central abdominal loops of nondilated small bowel, query ileus. 2. Faint linear structure extending along the right hemidiaphragm is probably due to clothing and seems too thin to represent the right hemidiaphragm in the setting of free intraperitoneal gas. However, I do recommend a left side down lateral decubitus view of the abdomen for further confirmation that there is no free intraperitoneal gas.  Alternatively, abdomen CT could be utilized. 3.  Retrocardiac density favors hiatal hernia. 4.  Linear subsegmental atelectasis in the left lower lobe.   Original Report Authenticated By: Dellia Cloud, M.D.     CBC  Lab 10/24/11 0451 10/23/11 1755  WBC 6.6 8.7  HGB 12.9* 13.8  HCT 38.5* 40.7  PLT 141* 166  MCV 87.3 87.5  MCH 29.3 29.7  MCHC 33.5 33.9  RDW 14.0 13.6  LYMPHSABS 0.7 0.7  MONOABS 0.5 0.6  EOSABS 0.0 0.0  BASOSABS 0.0 0.0  BANDABS -- --    Chemistries   Lab 10/24/11 0451 10/23/11 1755  NA 136 136  K 3.9 3.8  CL 102 101  CO2 24 24  GLUCOSE 146* 136*    BUN 20 22  CREATININE 1.01 0.95  CALCIUM 7.8* 8.9  MG -- --  AST 14 18  ALT 8 10  ALKPHOS 44 58  BILITOT 1.6* 1.1   ------------------------------------------------------------------------------------------------------------------ estimated creatinine  clearance is 75 ml/min (by C-G formula based on Cr of 1.01). ------------------------------------------------------------------------------------------------------------------ No results found for this basename: HGBA1C:2 in the last 72 hours ------------------------------------------------------------------------------------------------------------------ No results found for this basename: CHOL:2,HDL:2,LDLCALC:2,TRIG:2,CHOLHDL:2,LDLDIRECT:2 in the last 72 hours ------------------------------------------------------------------------------------------------------------------ No results found for this basename: TSH,T4TOTAL,FREET3,T3FREE,THYROIDAB in the last 72 hours ------------------------------------------------------------------------------------------------------------------ No results found for this basename: VITAMINB12:2,FOLATE:2,FERRITIN:2,TIBC:2,IRON:2,RETICCTPCT:2 in the last 72 hours  Coagulation profile  Lab 10/23/11 1755  INR 1.05  PROTIME --    No results found for this basename: DDIMER:2 in the last 72 hours  Cardiac Enzymes  Lab 10/23/11 1755  CKMB --  TROPONINI <0.30  MYOGLOBIN --   ------------------------------------------------------------------------------------------------------------------ No components found with this basename: POCBNP:3

## 2011-10-24 NOTE — Preoperative (Addendum)
Beta Blockers   Reason not to administer Beta Blockers:Hold beta blocker due to other, bowel obstruction, variable VSS

## 2011-10-24 NOTE — Progress Notes (Signed)
I have seen and examined the patient and agree with the assessment and plans.  Ransome Helwig A. Yesica Kemler  MD, FACS  

## 2011-10-24 NOTE — Transfer of Care (Signed)
Immediate Anesthesia Transfer of Care Note  Patient: Michael Mejia  Procedure(s) Performed: Procedure(s) (LRB) with comments: EXPLORATORY LAPAROTOMY (N/A) SMALL BOWEL RESECTION () - for small bowel perforation   Patient Location: PACU  Anesthesia Type: General  Level of Consciousness: awake, alert  and patient cooperative  Airway & Oxygen Therapy: Patient Spontanous Breathing and Patient connected to face mask oxygen  Post-op Assessment: Report given to PACU RN and Post -op Vital signs reviewed and stable  Post vital signs: Reviewed and stable  Complications: No apparent anesthesia complications

## 2011-10-24 NOTE — Progress Notes (Signed)
Pt taken to PACU/ PACU staff notified tubed cipro. [name]

## 2011-10-24 NOTE — Op Note (Signed)
Surgeon: Wenda Low, MD, FACS  Asst:  none  Anes:  Gen.  Procedure: Exploratory laparotomy with resection of perforated small bowel diverticula with primary anastomosis  Diagnosis: Small bowel perforation with peritonitis  Complications: none  EBL:   minimal cc  Description of Procedure:  The patient was taken to [facility] or 1 on Monday morning at 1:30 AM.  He was given general anesthesia and prepped with PCMX and draped sterilely. A made a midline incision going slowly to the left of the umbilicus and more below and above. Upon entering the abdomen there was non-foul-smelling purulence and this was cultured for aerobes and anaerobes. I mobilize the small bowel and found a segment of bowel approximately 6 inches in length to contain the perforation. 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 There were 2 adjacent diverticula that I included with the necks resected specimen. I resected this using the GIA and then put these 2 segments together with a single application of the GIA closing the common defects in 2 layers with 4-0 PDS and 3-0 silk. The mesenteric defect which had been created using the LigaSure was approximated with figure-of-eight sutures of 3-0 silk. The abdomen was copiously irrigated with saline. I run as the bowel in that region and saw some other scattered small bowel 6 but these were not perforated and I elected to not disturb these. A my incision a limited purposefully. No other abnormalities could be palpated but I did palpate in all 4 quadrants broke up adhesions irrigated copiously and withdrew the irrigant was clear.  Midline fascial incisions were were then closed with #1 double-stranded PDS tied in the middle. The wound was packed with saline. Patient was taken recovery room in satisfactory condition.  Final diagnoses perforated small bowel diverticulum with  exudative peritonitis.  Matt B. Daphine Deutscher, MD, [facility],  [place] [phone]

## 2011-10-24 NOTE — Consult Note (Signed)
Chief Complaint:  Pain radiating up from the right groin into the mid abdomen  History of Present Illness:  Michael Mejia is an 71 y.o. male who had onset of scrotal pain this morning and gradually radiated up into the upper abdomen.  Initially seen in [facility], he was taken to [facility] for evaluation.  CT scan revealed some bubbles of extraluminal air associated with jejunum and colon.  Seen by me on the floor and is more tender than I would like to see.  He denies diarrhea or this could be a case of Crohn's.  He ate fish on Friday but didn't recall it having any bones.  He is in AF and could have thrown a small embolus to his bowel but his lactic acid is not elevated.  He is receiving Flagyl IV and has not received his Cipro.  I discussed management options and recommended exploratory laparotomy.  He understands that this could involve a temporary colostomy.    Past Medical History  Diagnosis Date  . Afib   . Hypercholesteremia     Past Surgical History  Procedure Date  . Rotator cuff repair     Current Facility-Administered Medications  Medication Dose Route Frequency Provider Last Rate Last Dose  . 0.9 %  sodium chloride infusion   Intravenous Once Derwood Kaplan, MD 150 mL/hr at 10/23/11 2025 150 mL/hr at 10/23/11 2025  . 0.9 %  sodium chloride infusion   Intravenous STAT Ankit Nanavati, MD      . 0.9 %  sodium chloride infusion   Intravenous Continuous Eduard Clos, MD      . acetaminophen (TYLENOL) tablet 650 mg  650 mg Oral Q6H PRN Eduard Clos, MD       Or  . acetaminophen (TYLENOL) suppository 650 mg  650 mg Rectal Q6H PRN Eduard Clos, MD      . ciprofloxacin (CIPRO) IVPB 400 mg  400 mg Intravenous Once Derwood Kaplan, MD      . HYDROmorphone (DILAUDID) injection 1 mg  1 mg Intravenous Once Derwood Kaplan, MD   1 mg at 10/23/11 1752  . HYDROmorphone (DILAUDID) injection 1 mg  1 mg Intravenous Once Derwood Kaplan, MD   1 mg at 10/23/11 2310  .  HYDROmorphone (DILAUDID) injection 1 mg  1 mg Intravenous Q3H PRN Eduard Clos, MD      . metoprolol (LOPRESSOR) injection 5 mg  5 mg Intravenous Q4H PRN Eduard Clos, MD      . metroNIDAZOLE (FLAGYL) IVPB 500 mg  500 mg Intravenous Once Derwood Kaplan, MD   500 mg at 10/23/11 2216  . ondansetron (ZOFRAN) tablet 4 mg  4 mg Oral Q6H PRN Eduard Clos, MD       Or  . ondansetron [facility]) injection 4 mg  4 mg Intravenous Q6H PRN Eduard Clos, MD      . pantoprazole (PROTONIX) injection 40 mg  40 mg Intravenous Q24H Eduard Clos, MD      . piperacillin-tazobactam (ZOSYN) IVPB 3.375 g  3.375 g Intravenous Q8H Leann Trefz Poindexter, PHARMD      . sodium chloride 0.9 % injection 3 mL  3 mL Intravenous Q12H Eduard Clos, MD      . DISCONTD: 0.9 %  sodium chloride infusion   Intravenous Once Derwood Kaplan, MD      . DISCONTD: HYDROmorphone (DILAUDID) injection 1 mg  1 mg Intravenous Q4H PRN Derwood Kaplan, MD      .  DISCONTD: labetalol (NORMODYNE,TRANDATE) injection 10 mg  10 mg Intravenous Q4H PRN Eduard Clos, MD      . DISCONTD: ondansetron (ZOFRAN) injection 4 mg  4 mg Intravenous Q8H PRN Derwood Kaplan, MD       Codeine Family History  Problem Relation Age of Onset  . Pancreatic cancer Mother   . Pancreatic cancer Brother    Social History:   reports that he has never smoked. He does not have any smokeless tobacco history on file. He reports that he does not drink alcohol or use illicit drugs.   REVIEW OF SYSTEMS - PERTINENT POSITIVES ONLY: Allergy to codeine  Physical Exam:   Blood pressure 132/76, pulse 96, temperature 99.5 F (37.5 C), temperature source Oral, resp. rate 22, height 5\' 9"  (1.753 m), weight 195 lb 8.8 oz (88.7 kg), SpO2 93.00%. Body mass index is 28.88 kg/(m^2).  Gen:  WDWN WM NAD  Neurological: Alert and oriented to person, place, and time. Motor and sensory function is grossly intact  Head: Normocephalic and  atraumatic.  Eyes: Conjunctivae are normal. Pupils are equal, round, and reactive to light. No scleral icterus.  Neck: Normal range of motion. Neck supple. No tracheal deviation or thyromegaly present.  Cardiovascular:  SR without murmurs or gallops.  No carotid bruits Respiratory: Effort normal.  No respiratory distress. No chest wall tenderness. Breath sounds normal.  No wheezes, rales or rhonchi.  Abdomen:  Tender in midabdomen  With rebound and guarding.  GU: Musculoskeletal: Normal range of motion. Extremities are nontender. No cyanosis, edema or clubbing noted Lymphadenopathy: No cervical, preauricular, postauricular or axillary adenopathy is present Skin: Skin is warm and dry. No rash noted. No diaphoresis. No erythema. No pallor. Pscyh: Normal mood and affect. Behavior is normal. Judgment and thought content normal.   LABORATORY RESULTS: Results for orders placed during the hospital encounter of 10/23/11 (from the past 48 hour(s))  CBC WITH DIFFERENTIAL     Status: Abnormal   Collection Time   10/23/11  5:55 PM      Component Value Range Comment   WBC 8.7  4.0 - 10.5 K/uL    RBC 4.65  4.22 - 5.81 MIL/uL    Hemoglobin 13.8  13.0 - 17.0 g/dL    HCT 16.1  09.6 - 04.5 %    MCV 87.5  78.0 - 100.0 fL    MCH 29.7  26.0 - 34.0 pg    MCHC 33.9  30.0 - 36.0 g/dL    RDW 40.9  81.1 - 91.4 %    Platelets 166  150 - 400 K/uL    Neutrophils Relative 85 (*) 43 - 77 %    Neutro Abs 7.4  1.7 - 7.7 K/uL    Lymphocytes Relative 8 (*) 12 - 46 %    Lymphs Abs 0.7  0.7 - 4.0 K/uL    Monocytes Relative 7  3 - 12 %    Monocytes Absolute 0.6  0.1 - 1.0 K/uL    Eosinophils Relative 0  0 - 5 %    Eosinophils Absolute 0.0  0.0 - 0.7 K/uL    Basophils Relative 0  0 - 1 %    Basophils Absolute 0.0  0.0 - 0.1 K/uL   BASIC METABOLIC PANEL     Status: Abnormal   Collection Time   10/23/11  5:55 PM      Component Value Range Comment   Sodium 136  135 - 145 mEq/L    Potassium 3.8  3.5 -  5.1 mEq/L     Chloride 101  96 - 112 mEq/L    CO2 24  19 - 32 mEq/L    Glucose, Bld 136 (*) 70 - 99 mg/dL    BUN 22  6 - 23 mg/dL    Creatinine, Ser 1.61  0.50 - 1.35 mg/dL    Calcium 8.9  8.4 - 09.6 mg/dL    GFR calc non Af Amer 82 (*) >90 mL/min    GFR calc Af Amer >90  >90 mL/min   TROPONIN I     Status: Normal   Collection Time   10/23/11  5:55 PM      Component Value Range Comment   Troponin I <0.30  <0.30 ng/mL   PROTIME-INR     Status: Normal   Collection Time   10/23/11  5:55 PM      Component Value Range Comment   Prothrombin Time 13.9  11.6 - 15.2 seconds    INR 1.05  0.00 - 1.49   APTT     Status: Normal   Collection Time   10/23/11  5:55 PM      Component Value Range Comment   aPTT 26  24 - 37 seconds   LIPASE, BLOOD     Status: Normal   Collection Time   10/23/11  5:55 PM      Component Value Range Comment   Lipase 41  11 - 59 U/L   HEPATIC FUNCTION PANEL     Status: Normal   Collection Time   10/23/11  5:55 PM      Component Value Range Comment   Total Protein 6.6  6.0 - 8.3 g/dL    Albumin 3.5  3.5 - 5.2 g/dL    AST 18  0 - 37 U/L    ALT 10  0 - 53 U/L    Alkaline Phosphatase 58  39 - 117 U/L    Total Bilirubin 1.1  0.3 - 1.2 mg/dL    Bilirubin, Direct 0.2  0.0 - 0.3 mg/dL    Indirect Bilirubin 0.9  0.3 - 0.9 mg/dL   URINALYSIS, ROUTINE W REFLEX MICROSCOPIC     Status: Abnormal   Collection Time   10/23/11  7:40 PM      Component Value Range Comment   Color, Urine YELLOW  YELLOW    APPearance CLOUDY (*) CLEAR    Specific Gravity, Urine 1.028  1.005 - 1.030    pH 5.0  5.0 - 8.0    Glucose, UA NEGATIVE  NEGATIVE mg/dL    Hgb urine dipstick NEGATIVE  NEGATIVE    Bilirubin Urine NEGATIVE  NEGATIVE    Ketones, ur 40 (*) NEGATIVE mg/dL    Protein, ur NEGATIVE  NEGATIVE mg/dL    Urobilinogen, UA 0.2  0.0 - 1.0 mg/dL    Nitrite NEGATIVE  NEGATIVE    Leukocytes, UA NEGATIVE  NEGATIVE MICROSCOPIC NOT DONE ON URINES WITH NEGATIVE PROTEIN, BLOOD, LEUKOCYTES, NITRITE, OR GLUCOSE  <1000 mg/dL.  LACTIC ACID, PLASMA     Status: Normal   Collection Time   10/23/11 10:00 PM      Component Value Range Comment   Lactic Acid, Venous 1.4  0.5 - 2.2 mmol/L     RADIOLOGY RESULTS: Ct Abdomen Pelvis Wo Contrast  10/23/2011  *RADIOLOGY REPORT*  Clinical Data: Right lower quadrant pain.  CT ABDOMEN AND PELVIS WITHOUT CONTRAST  Technique:  Multidetector CT imaging of the abdomen and pelvis was performed following the standard protocol without  intravenous contrast.  Comparison: Chest and two views abdomen 10/23/2011 at 1804 hours.  Findings: Lung bases demonstrate some dependent atelectatic change. No pleural or pericardial effusion.  There is cardiomegaly.  Oral contrast material in the distal esophagus could be due to poor motility and/or reflux.  There is wall thickening and inflammatory change about a loop of jejunum in the midline immediately anterior to the right common iliac artery.  There are a tiny locules of free air adjacent to this abnormal loop of small bowel.  There is some free fluid present.  The no pneumatosis or portal venous gas is identified. No focal fluid collection is identified.  There appear be some tiny gravel type stones layering dependently within the gallbladder.  The gallbladder is otherwise unremarkable. The liver, spleen, adrenal glands and pancreas appear normal.  A punctate nonobstructing stone is identified in the lower pole of the right kidney.  There is an exophytic lesion off the midpole of the left kidney measuring 1.9 cm with Hounsfield unit measurements of 22.2 which cannot be definitively characterized.  An additional smaller intraparenchymal low attenuating lesion is seen in the left kidney which also cannot be characterized.  The colon and appendix appear normal.  IMPRESSION:  1.  Free air within the abdomen with fluid present compatible with bowel perforation.  Free air is centered about a loop of jejunum which has thickened walls.  Differential  considerations include inflammatory change and ischemia. 2.  Indeterminate lesion lower pole left kidney.  Non emergent renal ultrasound recommended for further characterization. 3.  Punctate nonobstructing stone lower pole right kidney. 4.  Likely gravel type gallstones without evidence of cholecystitis.  Critical Value/emergent results were called by telephone at the time of interpretation on 10/23/2011 at 9:20 p.m. to Dr. Rhunette Croft, who verbally acknowledged these results.   Original Report Authenticated By: Bernadene Bell. D'ALESSIO, M.D.    Dg Abd Acute W/chest  10/23/2011  *RADIOLOGY REPORT*  Clinical Data: Fever.  Abdominal pain radiating towards the groin.  ACUTE ABDOMEN SERIES (ABDOMEN 2 VIEW & CHEST 1 VIEW)  Comparison: 04/11/2003  Findings: On the chest radiograph, right retro cardiac density favors hiatal hernia.  Tortuous thoracic aorta noted.  There is suspected subsegmental atelectasis at the left lung base.  Faint linear edge noted along the right hemidiaphragm.  Although the appearance is not classic for free intraperitoneal gas, intraperitoneal gas becomes difficult to completely exclude given this appearance.  Accordingly, I recommend either a left side down lateral decubitus view the abdomen or abdomen CT for further characterization.  Gas and stool noted in the colon several air-fluid levels are present in nondilated central abdominal small bowel, nonspecific.  IMPRESSION:  1.  Several nonspecific air-fluid levels and central abdominal loops of nondilated small bowel, query ileus. 2. Faint linear structure extending along the right hemidiaphragm is probably due to clothing and seems too thin to represent the right hemidiaphragm in the setting of free intraperitoneal gas. However, I do recommend a left side down lateral decubitus view of the abdomen for further confirmation that there is no free intraperitoneal gas.  Alternatively, abdomen CT could be utilized. 3.  Retrocardiac density favors hiatal  hernia. 4.  Linear subsegmental atelectasis in the left lower lobe.   Original Report Authenticated By: Dellia Cloud, M.D.     Problem List: Patient Active Problem List  Diagnosis  . Perforated viscus  . Renal stones  . Atrial fibrillation    Assessment & Plan: Acute abdomen with possible perforation of  the small bowel or colon.  Will plan exploratory laparotomy with possible colostomy    Matt B. Daphine Deutscher, MD, [facility] [phone] beeper [phone]  10/24/2011 12:12 AM

## 2011-10-24 NOTE — Progress Notes (Signed)
ANTIBIOTIC CONSULT NOTE - Follow up  Pharmacy Consult for Zosyn Indication: Perforated viscus  Allergies  Allergen Reactions  . Codeine Nausea Only    Patient Measurements: Height: 5\' 9"  (175.3 cm) Weight: 195 lb 8.8 oz (88.7 kg) IBW/kg (Calculated) : 70.7    Vital Signs: Temp: 98.6 F (37 C) (09/16 0700) Temp src: Oral (09/16 0700) BP: 108/66 mmHg (09/16 0700) Pulse Rate: 96  (09/16 0700) Intake/Output from previous day: 09/15 0701 - 09/16 0700 In: 2034.2 [I.V.:2004.2; NG/GT:30] Out: 700 [Urine:250; Blood:50] Intake/Output from this shift: Total I/O In: 30 [NG/GT:30] Out: -   Labs:  Basename 10/24/11 0451 10/23/11 1755  WBC 6.6 8.7  HGB 12.9* 13.8  PLT 141* 166  LABCREA -- --  CREATININE 1.01 0.95   Estimated Creatinine Clearance: 75 ml/min (by C-G formula based on Cr of 1.01). No results found for this basename: VANCOTROUGH:2,VANCOPEAK:2,VANCORANDOM:2,GENTTROUGH:2,GENTPEAK:2,GENTRANDOM:2,TOBRATROUGH:2,TOBRAPEAK:2,TOBRARND:2,AMIKACINPEAK:2,AMIKACINTROU:2,AMIKACIN:2, in the last 72 hours   Microbiology: No results found for this or any previous visit (from the past 720 hour(s)).  Medical History: Past Medical History  Diagnosis Date  . Afib   . Hypercholesteremia     Medications:  Scheduled:     . sodium chloride   Intravenous Once  . heparin  5,000 Units Subcutaneous Q8H  .  HYDROmorphone (DILAUDID) injection  1 mg Intravenous Once  .  HYDROmorphone (DILAUDID) injection  1 mg Intravenous Once  . metronidazole  500 mg Intravenous Once  . morphine   Intravenous Q4H  . pantoprazole (PROTONIX) IV  40 mg Intravenous Q24H  . piperacillin-tazobactam (ZOSYN)  IV  3.375 g Intravenous Q8H  . sodium chloride  3 mL Intravenous Q12H  . DISCONTD: sodium chloride   Intravenous Once  . DISCONTD: sodium chloride   Intravenous STAT  . DISCONTD: ciprofloxacin  400 mg Intravenous Once  . DISCONTD: morphine   Intravenous Q4H   Infusions:     . dextrose 5 % and  0.45 % NaCl with KCl 20 mEq/L 100 mL/hr at 10/24/11 1154  . DISCONTD: sodium chloride Stopped (10/24/11 0147)  . DISCONTD: dextrose 5 % and 0.45 % NaCl with KCl 20 mEq/L 125 mL/hr at 10/24/11 0510   Assessment:  71 year old male with complaint of abdominal pain  Abdomen pelvis CT shows suspected perforated viscus  Patient has received Flagyl 500mg  IV x 1 in ED @ 22:16  Zosyn ordered empirically for perforated viscus  Surgery has been consulted  Goal of Therapy:  Eradication of suspected infection  Plan:   Cont Zosyn 3.375gm IV q8h (each dose infused over 4 hrs).  With CrCl >77ml/min, dose adjustments are not likely to be needed.   Pharmacy will sign-off.  Charolotte Eke, PharmD, pager [phone]. 10/24/2011,2:16 PM.

## 2011-10-24 NOTE — Progress Notes (Signed)
Patient ID: Michael Mejia, male   DOB: August 18, 1940, 71 y.o.   MRN: [id number] Day of Surgery  Subjective: Pt is still groggy.  Pain is controlled.  Objective: Vital signs in last 24 hours: Temp:  [97.4 F (36.3 C)-99.5 F (37.5 C)] 98.6 F (37 C) (09/16 0700) Pulse Rate:  [72-108] 96  (09/16 0700) Resp:  [12-25] 17  (09/16 0805) BP: (108-140)/(52-79) 108/66 mmHg (09/16 0700) SpO2:  [93 %-99 %] 96 % (09/16 0805) Weight:  [195 lb 8.8 oz (88.7 kg)] 195 lb 8.8 oz (88.7 kg) (09/15 2333) Last BM Date: 10/23/11  Intake/Output from previous day: 09/15 0701 - 09/16 0700 In: 2034.2 [I.V.:2004.2; NG/GT:30] Out: 700 [Urine:250; Blood:50] Intake/Output this shift:    PE: Abd: soft, still distended some, -BS, NG in place with minimal bilious output.    Lab Results:   Basename 10/24/11 0451 10/23/11 1755  WBC 6.6 8.7  HGB 12.9* 13.8  HCT 38.5* 40.7  PLT 141* 166   BMET  Basename 10/24/11 0451 10/23/11 1755  NA 136 136  K 3.9 3.8  CL 102 101  CO2 24 24  GLUCOSE 146* 136*  BUN 20 22  CREATININE 1.01 0.95  CALCIUM 7.8* 8.9   PT/INR  Basename 10/23/11 1755  LABPROT 13.9  INR 1.05   CMP     Component Value Date/Time   NA 136 10/24/2011 0451   K 3.9 10/24/2011 0451   CL 102 10/24/2011 0451   CO2 24 10/24/2011 0451   GLUCOSE 146* 10/24/2011 0451   BUN 20 10/24/2011 0451   CREATININE 1.01 10/24/2011 0451   CALCIUM 7.8* 10/24/2011 0451   PROT 5.5* 10/24/2011 0451   ALBUMIN 2.8* 10/24/2011 0451   AST 14 10/24/2011 0451   ALT 8 10/24/2011 0451   ALKPHOS 44 10/24/2011 0451   BILITOT 1.6* 10/24/2011 0451   GFRNONAA 73* 10/24/2011 0451   GFRAA 85* 10/24/2011 0451   Lipase     Component Value Date/Time   LIPASE 41 10/23/2011 1755       Studies/Results: Ct Abdomen Pelvis Wo Contrast  10/23/2011  *RADIOLOGY REPORT*  Clinical Data: Right lower quadrant pain.  CT ABDOMEN AND PELVIS WITHOUT CONTRAST  Technique:  Multidetector CT imaging of the abdomen and pelvis was performed following  the standard protocol without intravenous contrast.  Comparison: Chest and two views abdomen 10/23/2011 at 1804 hours.  Findings: Lung bases demonstrate some dependent atelectatic change. No pleural or pericardial effusion.  There is cardiomegaly.  Oral contrast material in the distal esophagus could be due to poor motility and/or reflux.  There is wall thickening and inflammatory change about a loop of jejunum in the midline immediately anterior to the right common iliac artery.  There are a tiny locules of free air adjacent to this abnormal loop of small bowel.  There is some free fluid present.  The no pneumatosis or portal venous gas is identified. No focal fluid collection is identified.  There appear be some tiny gravel type stones layering dependently within the gallbladder.  The gallbladder is otherwise unremarkable. The liver, spleen, adrenal glands and pancreas appear normal.  A punctate nonobstructing stone is identified in the lower pole of the right kidney.  There is an exophytic lesion off the midpole of the left kidney measuring 1.9 cm with Hounsfield unit measurements of 22.2 which cannot be definitively characterized.  An additional smaller intraparenchymal low attenuating lesion is seen in the left kidney which also cannot be characterized.  The colon and appendix  appear normal.  IMPRESSION:  1.  Free air within the abdomen with fluid present compatible with bowel perforation.  Free air is centered about a loop of jejunum which has thickened walls.  Differential considerations include inflammatory change and ischemia. 2.  Indeterminate lesion lower pole left kidney.  Non emergent renal ultrasound recommended for further characterization. 3.  Punctate nonobstructing stone lower pole right kidney. 4.  Likely gravel type gallstones without evidence of cholecystitis.  Critical Value/emergent results were called by telephone at the time of interpretation on 10/23/2011 at 9:20 p.m. to Dr. Rhunette Croft, who  verbally acknowledged these results.   Original Report Authenticated By: Bernadene Bell. D'ALESSIO, M.D.    Dg Abd Acute W/chest  10/23/2011  *RADIOLOGY REPORT*  Clinical Data: Fever.  Abdominal pain radiating towards the groin.  ACUTE ABDOMEN SERIES (ABDOMEN 2 VIEW & CHEST 1 VIEW)  Comparison: 04/11/2003  Findings: On the chest radiograph, right retro cardiac density favors hiatal hernia.  Tortuous thoracic aorta noted.  There is suspected subsegmental atelectasis at the left lung base.  Faint linear edge noted along the right hemidiaphragm.  Although the appearance is not classic for free intraperitoneal gas, intraperitoneal gas becomes difficult to completely exclude given this appearance.  Accordingly, I recommend either a left side down lateral decubitus view the abdomen or abdomen CT for further characterization.  Gas and stool noted in the colon several air-fluid levels are present in nondilated central abdominal small bowel, nonspecific.  IMPRESSION:  1.  Several nonspecific air-fluid levels and central abdominal loops of nondilated small bowel, query ileus. 2. Faint linear structure extending along the right hemidiaphragm is probably due to clothing and seems too thin to represent the right hemidiaphragm in the setting of free intraperitoneal gas. However, I do recommend a left side down lateral decubitus view of the abdomen for further confirmation that there is no free intraperitoneal gas.  Alternatively, abdomen CT could be utilized. 3.  Retrocardiac density favors hiatal hernia. 4.  Linear subsegmental atelectasis in the left lower lobe.   Original Report Authenticated By: Dellia Cloud, M.D.     Anti-infectives: Anti-infectives     Start     Dose/Rate Route Frequency Ordered Stop   10/24/11 0000  piperacillin-tazobactam (ZOSYN) IVPB 3.375 g       3.375 g 12.5 mL/hr over 240 Minutes Intravenous Every 8 hours 10/23/11 2356     10/23/11 2130   ciprofloxacin (CIPRO) IVPB 400 mg  Status:   Discontinued        400 mg 200 mL/hr over 60 Minutes Intravenous  Once 10/23/11 2121 10/24/11 0254   10/23/11 2130   metroNIDAZOLE (FLAGYL) IVPB 500 mg        500 mg 100 mL/hr over 60 Minutes Intravenous  Once 10/23/11 2121 10/23/11 2316           Assessment/Plan  1. 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 Post op ileus  Plan: 1. Cont current care for today.  Will look at mobilization and dc foley tomorrow 2. Cont NGT and bowel rest.   LOS: 1 day    Davonna Ertl E 10/24/2011, 9:02 AM Pager: [phone]

## 2011-10-24 NOTE — Anesthesia Postprocedure Evaluation (Signed)
Anesthesia Post Note  Patient: Michael Mejia  Procedure(s) Performed: Procedure(s) (LRB): EXPLORATORY LAPAROTOMY (N/A) SMALL BOWEL RESECTION ()  Anesthesia type: General  Patient location: PACU  Post pain: Pain level controlled  Post assessment: Post-op Vital signs reviewed  Last Vitals: BP 119/64  Pulse 91  Temp 37.2 C (Oral)  Resp 14  Ht 5\' 9"  (1.753 m)  Wt 195 lb 8.8 oz (88.7 kg)  BMI 28.88 kg/m2  SpO2 96%  Post vital signs: Reviewed  Level of consciousness: sedated  Complications: No apparent anesthesia complications

## 2011-10-24 NOTE — Progress Notes (Signed)
   CARE MANAGEMENT NOTE 10/24/2011  Patient:  Michael Mejia, Michael Mejia   Account Number:  [id number]  Date Initiated:  10/24/2011  Documentation initiated by:  Jiles Crocker  Subjective/Objective Assessment:   ADMITTED WITH ABDOMINAL PAIN / Small bowel perforation with peritonitis     Action/Plan:   PCP - Dr. Elias Else.  Cardiologist  - Dr. Viann Fish.  LIVES AT HOME WITH SPOUSE; HHC VS SHORT TERM SNF AT DISCHARGE; AWAITING ON PT/OT EVALS   Anticipated DC Date:  10/31/2011   Anticipated DC Plan:  SKILLED NURSING FACILITY      DC Planning Services  CM consult           Status of service:  In process, will continue to follow Medicare Important Message given?  NA - LOS <3 / Initial given by admissions (If response is "NO", the following Medicare IM given date fields will be blank)  Per UR Regulation:  Reviewed for med. necessity/level of care/duration of stay  Comments:  10/24/2011- B Xzander Gilham RN, BSN, MHA

## 2011-10-25 ENCOUNTER — Encounter (HOSPITAL_COMMUNITY): Payer: Self-pay | Admitting: Surgery

## 2011-10-25 LAB — GLUCOSE, CAPILLARY
Glucose-Capillary: 110 mg/dL — ABNORMAL HIGH (ref 70–99)
Glucose-Capillary: 118 mg/dL — ABNORMAL HIGH (ref 70–99)
Glucose-Capillary: 122 mg/dL — ABNORMAL HIGH (ref 70–99)
Glucose-Capillary: 136 mg/dL — ABNORMAL HIGH (ref 70–99)

## 2011-10-25 LAB — CBC
Hemoglobin: 12.8 g/dL — ABNORMAL LOW (ref 13.0–17.0)
MCHC: 33.2 g/dL (ref 30.0–36.0)
RBC: 4.3 MIL/uL (ref 4.22–5.81)
WBC: 7.8 10*3/uL (ref 4.0–10.5)

## 2011-10-25 LAB — COMPREHENSIVE METABOLIC PANEL
ALT: 10 U/L (ref 0–53)
Albumin: 2.7 g/dL — ABNORMAL LOW (ref 3.5–5.2)
Alkaline Phosphatase: 63 U/L (ref 39–117)
GFR calc Af Amer: 70 mL/min — ABNORMAL LOW (ref >90)
Glucose, Bld: 114 mg/dL — ABNORMAL HIGH (ref 70–99)
Potassium: 4 mEq/L (ref 3.5–5.1)
Sodium: 134 mEq/L — ABNORMAL LOW (ref 135–145)
Total Protein: 5.8 g/dL — ABNORMAL LOW (ref 6.0–8.3)

## 2011-10-25 MED ORDER — MORPHINE SULFATE 2 MG/ML IJ SOLN
2.0000 mg | INTRAMUSCULAR | Status: DC | PRN
  Administered 2011-10-25 – 2011-10-30 (×17): 2 mg via INTRAVENOUS
  Filled 2011-10-25 (×19): qty 1

## 2011-10-25 MED ORDER — KCL IN DEXTROSE-NACL 10-5-0.45 MEQ/L-%-% IV SOLN
INTRAVENOUS | Status: AC
  Administered 2011-10-25 – 2011-10-26 (×3): via INTRAVENOUS
  Filled 2011-10-25 (×5): qty 1000

## 2011-10-25 NOTE — Evaluation (Signed)
Physical Therapy Evaluation Patient Details Name: Michael Mejia MRN: [id number] DOB: 11/13/1940 Today's Date: 10/25/2011 Time: 7846-9629 PT Time Calculation (min): 24 min  PT Assessment / Plan / Recommendation Clinical Impression  Pt. was admitted w/ c/o abdominal pain. S/P expl. lap for small bowel Perforation/resection. Pt. lives alone. Pt. will benefit fromPT to return to independent level    PT Assessment  Patient needs continued PT services    Follow Up Recommendations  No PT follow up    Barriers to Discharge Decreased caregiver support pt. uncertain of caregiver after DC.    Equipment Recommendations  Rolling walker with 5" wheels    Recommendations for Other Services     Frequency Min 3X/week    Precautions / Restrictions Precautions Precaution Comments: NG , abdominal dressing(to get  VAC   Pertinent Vitals/Pain Stated pain was 5/10--did not use PCA       Mobility  Bed Mobility Bed Mobility: Rolling Right;Right Sidelying to Sit;Sit to Sidelying Right Rolling Right: 5: Set up;With rail Right Sidelying to Sit: 4: Min assist;HOB elevated (raised to 50) Sit to Sidelying Right: 5: Set up;With rail;HOB elevated Details for Bed Mobility Assistance: Pt. required assistance to get trunk to upright from sit/sidelying  to fully upright to prorect abdomen Transfers Transfers: Sit to Stand;Stand to Sit Sit to Stand: 4: Min guard;From elevated surface;From bed Stand to Sit: 4: Min guard;To bed Details for Transfer Assistance: Pt. required no assistance to stand. Ambulation/Gait Ambulation/Gait Assistance: 1: +2 Total assist (+1 for IV pole) Ambulation/Gait: Patient Percentage: 70% Ambulation Distance (Feet): 75 Feet Assistive device: Rolling walker Ambulation/Gait Assistance Details: Pt. was able to stand fairly erect,  Gait Pattern: Within Functional Limits Gait velocity: decreased,    Exercises     PT Diagnosis: Acute pain;Difficulty walking  PT Problem List:  Decreased activity tolerance;Decreased mobility;Decreased knowledge of use of DME;Pain PT Treatment Interventions: DME instruction;Gait training;Functional mobility training;Therapeutic activities;Patient/family education   PT Goals Acute Rehab PT Goals PT Goal Formulation: With patient/family Time For Goal Achievement: 10/25/11 Potential to Achieve Goals: Good Pt will go Supine/Side to Sit: Independently;with HOB 0 degrees PT Goal: Supine/Side to Sit - Progress: Goal set today Pt will go Sit to Supine/Side: Independently;with HOB 0 degrees PT Goal: Sit to Supine/Side - Progress: Goal set today Pt will go Sit to Stand: Independently PT Goal: Sit to Stand - Progress: Goal set today Pt will go Stand to Sit: Independently PT Goal: Stand to Sit - Progress: Goal set today Pt will Ambulate: >150 feet;Independently PT Goal: Ambulate - Progress: Goal set today  Visit Information  Last PT Received On: 10/25/11 Assistance Needed: +2 (equipment)    Subjective Data  Subjective: I have been busy Patient Stated Goal: I want to walk   Prior Functioning  Home Living Lives With: Alone Available Help at Discharge: Other (Comment) (Pt is unsure of caregiver after DC) Type of Home: House Home Access: Stairs to enter [employer] of Steps: 2 Entrance Stairs-Rails: Right Home Layout: One level Bathroom Shower/Tub: Engineer, manufacturing systems: Standard Home Adaptive Equipment: None Prior Function Level of Independence: Independent Able to Take Stairs?: Yes Driving: Yes Communication Communication: No difficulties    Cognition  Overall Cognitive Status: Appears within functional limits for tasks assessed/performed Arousal/Alertness: Awake/alert Orientation Level: Appears intact for tasks assessed Behavior During Session: [facility] for tasks performed    Extremity/Trunk Assessment Right Upper Extremity Assessment RUE ROM/Strength/Tone: [facility] for tasks assessed Left Upper Extremity  Assessment LUE ROM/Strength/Tone: [facility] for tasks assessed  Right Lower Extremity Assessment RLE ROM/Strength/Tone: [facility] for tasks assessed Left Lower Extremity Assessment LLE ROM/Strength/Tone: [facility] for tasks assessed   Balance    End of Session PT - End of Session Activity Tolerance: Patient tolerated treatment well;Patient limited by fatigue Patient left: in bed;with call bell/phone within reach;with family/visitor present Nurse Communication: Mobility status  GP     Rada Hay 10/25/2011, 12:34 PM  [phone]

## 2011-10-25 NOTE — Progress Notes (Signed)
[facility]                                                                                Patient Demographics  Michael Mejia, is a 71 y.o. male  WUJ:[id number]  NFA:[id number]  DOB - Feb 01, 1941  Admit date - 10/23/2011  Admitting Physician Eduard Clos, MD  Outpatient Primary MD for the patient is No primary provider on file.  LOS - 2   Chief Complaint  Patient presents with  . 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 His other medical issues are atrial fibrillation and dyslipidemia he should appear stable.     1. 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 Daphine Deutscher general surgery - plan is to continue empiric IV Zosyn, continued n.p.o., NG tube for decompression, general surgery following closely. Patient still not passing gas and still has NG tube, continue IV fluids, if not taking by mouth for another couple of days or so we will start TPN.     2. Incidental finding of non-obstructing renal stone and questionable lesion on the kidney on CT scan- ultrasound suggestive of complex cyst, will need outpatient urology follow up in 3-4 weeks.     3. History of atrial fibrillation - patient on Lopressor and aspirin at home, currently unable to take due to n.p.o. status for #1 above, when necessary IV Lopressor ordered, if it becomes an issue we'll try IV digoxin or IV drip beta blocker if  needed.     4. History of dyslipidemia - resume home dose Zocor once taking by mouth.     Code Status: Full  Family Communication: With the patient bedside on 10/24/2011  Disposition Plan: TBD    Procedures   1. On 10/23/2011 by Dr. Daphine Deutscher general surgery patient had Exploratory laparotomy with resection of perforated small bowel diverticula with primary anastomosis.  2. CT abdomen and pelvis  3. Renal ultrasound   Prognosis remains guarded.   Consults  Gen. surgery   Time Spent in minutes   35   Antibiotics    Anti-infectives     Start     Dose/Rate Route Frequency Ordered Stop   10/24/11 0000  piperacillin-tazobactam (ZOSYN) IVPB 3.375 g       3.375 g 12.5 mL/hr over 240 Minutes Intravenous Every 8 hours 10/23/11 2356     10/23/11 2130   ciprofloxacin (CIPRO) IVPB 400 mg  Status:  Discontinued        400 mg 200 mL/hr over 60 Minutes Intravenous  Once 10/23/11 2121 10/24/11 0254   10/23/11 2130   metroNIDAZOLE (FLAGYL) IVPB 500 mg        500  mg 100 mL/hr over 60 Minutes Intravenous  Once 10/23/11 2121 10/23/11 2316          Scheduled Meds:    . heparin  5,000 Units Subcutaneous Q8H  . morphine   Intravenous Q4H  . pantoprazole (PROTONIX) IV  40 mg Intravenous Q24H  . piperacillin-tazobactam (ZOSYN)  IV  3.375 g Intravenous Q8H  . sodium chloride  3 mL Intravenous Q12H   Continuous Infusions:    . dextrose 5 % and 0.45 % NaCl with KCl 10 mEq/L    . dextrose 5 % and 0.45 % NaCl with KCl 20 mEq/L 100 mL/hr at 10/25/11 1033   PRN Meds:.acetaminophen, acetaminophen, metoprolol, oxyCODONE   DVT Prophylaxis   Heparin     Leroy Sea M.D on 10/25/2011 at 11:09 AM  Between 7am to 7pm - Pager - [phone]  After 7pm go to [email or web address] - password [email or web address]  And look for the night coverage person covering for me after hours  [facility] Office  [phone]    Subjective:   Michael Mejia today has, No headache, No chest  pain, mild generalized abdominal pain - No Nausea, No new weakness tingling or numbness, No Cough - SOB.   Objective:   Filed Vitals:   10/25/11 0000 10/25/11 0400 10/25/11 0452 10/25/11 0852  BP:   114/75   Pulse:   94   Temp:   99.1 F (37.3 C)   TempSrc:   Oral   Resp: 14 14 18 19   Height:      Weight:   90.7 kg (199 lb 15.3 oz)   SpO2: 96% 97% 95% 95%    Wt Readings from Last 3 Encounters:  10/25/11 90.7 kg (199 lb 15.3 oz)  10/25/11 90.7 kg (199 lb 15.3 oz)     Intake/Output Summary (Last 24 hours) at 10/25/11 1109 Last data filed at 10/25/11 1034  Gross per 24 hour  Intake   1550 ml  Output   1475 ml  Net     75 ml    Exam Awake Alert, Oriented X 3, No new F.N deficits, Normal affect George.AT,PERRAL Supple Neck,No JVD, No cervical lymphadenopathy appriciated.  Symmetrical Chest wall movement, Good air movement bilaterally, CTAB RRR,No Gallops,Rubs or new Murmurs, No Parasternal Heave No  B.Sounds, Abd Soft, some generalized tenderness, No organomegaly appriciated, No rebound or rigidity. No Cyanosis, Clubbing or edema, No new Rash or bruise     Data Review   Micro Results Recent Results (from the past 240 hour(s))  CULTURE, BLOOD (ROUTINE X 2)     Status: Normal (Preliminary result)   Collection Time   10/23/11 10:00 PM      Component Value Range Status Comment   Specimen Description BLOOD RIGHT ANTECUBITAL   Final    Special Requests BOTTLES DRAWN AEROBIC AND ANAEROBIC 5 CC EA   Final    Culture  Setup Time 10/24/2011 08:23   Final    Culture     Final    Value:        BLOOD CULTURE RECEIVED NO GROWTH TO DATE CULTURE WILL BE HELD FOR 5 DAYS BEFORE ISSUING A FINAL NEGATIVE REPORT   Report Status PENDING   Incomplete   CULTURE, BLOOD (ROUTINE X 2)     Status: Normal (Preliminary result)   Collection Time   10/23/11 10:10 PM      Component Value Range Status Comment   Specimen Description BLOOD RIGHT ARM   Final    Special Requests BOTTLES  DRAWN AEROBIC ONLY  3 CC   Final    Culture  Setup Time 10/24/2011 08:23   Final    Culture     Final    Value:        BLOOD CULTURE RECEIVED NO GROWTH TO DATE CULTURE WILL BE HELD FOR 5 DAYS BEFORE ISSUING A FINAL NEGATIVE REPORT   Report Status PENDING   Incomplete   WOUND CULTURE     Status: Normal (Preliminary result)   Collection Time   10/24/11  3:39 AM      Component Value Range Status Comment   Specimen Description ABDOMEN   Final    Special Requests NONE   Final    Gram Stain PENDING   Incomplete    Culture Culture reincubated for better growth   Final    Report Status PENDING   Incomplete     Radiology Reports Ct Abdomen Pelvis Wo Contrast  10/23/2011  *RADIOLOGY REPORT*  Clinical Data: Right lower quadrant pain.  CT ABDOMEN AND PELVIS WITHOUT CONTRAST  Technique:  Multidetector CT imaging of the abdomen and pelvis was performed following the standard protocol without intravenous contrast.  Comparison: Chest and two views abdomen 10/23/2011 at 1804 hours.  Findings: Lung bases demonstrate some dependent atelectatic change. No pleural or pericardial effusion.  There is cardiomegaly.  Oral contrast material in the distal esophagus could be due to poor motility and/or reflux.  There is wall thickening and inflammatory change about a loop of jejunum in the midline immediately anterior to the right common iliac artery.  There are a tiny locules of free air adjacent to this abnormal loop of small bowel.  There is some free fluid present.  The no pneumatosis or portal venous gas is identified. No focal fluid collection is identified.  There appear be some tiny gravel type stones layering dependently within the gallbladder.  The gallbladder is otherwise unremarkable. The liver, spleen, adrenal glands and pancreas appear normal.  A punctate nonobstructing stone is identified in the lower pole of the right kidney.  There is an exophytic lesion off the midpole of the left kidney measuring 1.9 cm with Hounsfield unit  measurements of 22.2 which cannot be definitively characterized.  An additional smaller intraparenchymal low attenuating lesion is seen in the left kidney which also cannot be characterized.  The colon and appendix appear normal.  IMPRESSION:  1.  Free air within the abdomen with fluid present compatible with bowel perforation.  Free air is centered about a loop of jejunum which has thickened walls.  Differential considerations include inflammatory change and ischemia. 2.  Indeterminate lesion lower pole left kidney.  Non emergent renal ultrasound recommended for further characterization. 3.  Punctate nonobstructing stone lower pole right kidney. 4.  Likely gravel type gallstones without evidence of cholecystitis.  Critical Value/emergent results were called by telephone at the time of interpretation on 10/23/2011 at 9:20 p.m. to Dr. Rhunette Croft, who verbally acknowledged these results.   Original Report Authenticated By: Bernadene Bell. D'ALESSIO, M.D.    Dg Abd Acute W/chest  10/23/2011  *RADIOLOGY REPORT*  Clinical Data: Fever.  Abdominal pain radiating towards the groin.  ACUTE ABDOMEN SERIES (ABDOMEN 2 VIEW & CHEST 1 VIEW)  Comparison: 04/11/2003  Findings: On the chest radiograph, right retro cardiac density favors hiatal hernia.  Tortuous thoracic aorta noted.  There is suspected subsegmental atelectasis at the left lung base.  Faint linear edge noted along the right hemidiaphragm.  Although the appearance is not classic for free  intraperitoneal gas, intraperitoneal gas becomes difficult to completely exclude given this appearance.  Accordingly, I recommend either a left side down lateral decubitus view the abdomen or abdomen CT for further characterization.  Gas and stool noted in the colon several air-fluid levels are present in nondilated central abdominal small bowel, nonspecific.  IMPRESSION:  1.  Several nonspecific air-fluid levels and central abdominal loops of nondilated small bowel, query ileus. 2. Faint  linear structure extending along the right hemidiaphragm is probably due to clothing and seems too thin to represent the right hemidiaphragm in the setting of free intraperitoneal gas. However, I do recommend a left side down lateral decubitus view of the abdomen for further confirmation that there is no free intraperitoneal gas.  Alternatively, abdomen CT could be utilized. 3.  Retrocardiac density favors hiatal hernia. 4.  Linear subsegmental atelectasis in the left lower lobe.   Original Report Authenticated By: Dellia Cloud, M.D.     CBC  Lab 10/25/11 0425 10/24/11 0451 10/23/11 1755  WBC 7.8 6.6 8.7  HGB 12.8* 12.9* 13.8  HCT 38.5* 38.5* 40.7  PLT 155 141* 166  MCV 89.5 87.3 87.5  MCH 29.8 29.3 29.7  MCHC 33.2 33.5 33.9  RDW 14.1 14.0 13.6  LYMPHSABS -- 0.7 0.7  MONOABS -- 0.5 0.6  EOSABS -- 0.0 0.0  BASOSABS -- 0.0 0.0  BANDABS -- -- --    Chemistries   Lab 10/25/11 0425 10/24/11 0451 10/23/11 1755  NA 134* 136 136  K 4.0 3.9 3.8  CL 100 102 101  CO2 26 24 24   GLUCOSE 114* 146* 136*  BUN 23 20 22   CREATININE 1.19 1.01 0.95  CALCIUM 8.4 7.8* 8.9  MG 1.8 -- --  AST 17 14 18   ALT 10 8 10   ALKPHOS 63 44 58  BILITOT 1.4* 1.6* 1.1   ------------------------------------------------------------------------------------------------------------------ estimated creatinine clearance is 64.3 ml/min (by C-G formula based on Cr of 1.19). ------------------------------------------------------------------------------------------------------------------ No results found for this basename: HGBA1C:2 in the last 72 hours ------------------------------------------------------------------------------------------------------------------ No results found for this basename: CHOL:2,HDL:2,LDLCALC:2,TRIG:2,CHOLHDL:2,LDLDIRECT:2 in the last 72 hours ------------------------------------------------------------------------------------------------------------------ No results found for this  basename: TSH,T4TOTAL,FREET3,T3FREE,THYROIDAB in the last 72 hours ------------------------------------------------------------------------------------------------------------------ No results found for this basename: VITAMINB12:2,FOLATE:2,FERRITIN:2,TIBC:2,IRON:2,RETICCTPCT:2 in the last 72 hours  Coagulation profile  Lab 10/23/11 1755  INR 1.05  PROTIME --    No results found for this basename: DDIMER:2 in the last 72 hours  Cardiac Enzymes  Lab 10/23/11 1755  CKMB --  TROPONINI <0.30  MYOGLOBIN --   ------------------------------------------------------------------------------------------------------------------ No components found with this basename: POCBNP:3

## 2011-10-25 NOTE — Progress Notes (Signed)
1 Day Post-Op  Subjective: Doing well this am sitting up at bedside, pain is well controlled with PCA.   Objective: Vital signs in last 24 hours: Temp:  [98 F (36.7 C)-99.1 F (37.3 C)] 99.1 F (37.3 C) (09/17 0452) Pulse Rate:  [85-94] 94  (09/17 0452) Resp:  [12-18] 18  (09/17 0452) BP: (106-115)/(67-75) 114/75 mmHg (09/17 0452) SpO2:  [94 %-97 %] 95 % (09/17 0452) Weight:  [199 lb 15.3 oz (90.7 kg)] 199 lb 15.3 oz (90.7 kg) (09/17 0452) Last BM Date: 10/23/11  Intake/Output from previous day: 09/16 0701 - 09/17 0700 In: 1550 [I.V.:1200; NG/GT:150; IV Piggyback:200] Out: 1175 [Urine:400; Emesis/NG output:775] Intake/Output this shift: Total I/O In: 0  Out: 100 [Urine:100]  General appearance: alert, cooperative, appears stated age and no distress Chest: CTA bilaterally Cardiac: RRR Abdomen: No BS or flatus, NG output (732ml/24 hr dark bilious) dressing C/D/I. VSS, afebrile, WBC wnl, H&H stable, BUN and creatinine up slightly.  Lab Results:   Basename 10/25/11 0425 10/24/11 0451  WBC 7.8 6.6  HGB 12.8* 12.9*  HCT 38.5* 38.5*  PLT 155 141*   BMET  Basename 10/25/11 0425 10/24/11 0451  NA 134* 136  K 4.0 3.9  CL 100 102  CO2 26 24  GLUCOSE 114* 146*  BUN 23 20  CREATININE 1.19 1.01  CALCIUM 8.4 7.8*   PT/INR  Basename 10/23/11 1755  LABPROT 13.9  INR 1.05   ABG No results found for this basename: PHART:2,PCO2:2,PO2:2,HCO3:2 in the last 72 hours  Studies/Results: Ct Abdomen Pelvis Wo Contrast  10/23/2011  *RADIOLOGY REPORT*  Clinical Data: Right lower quadrant pain.  CT ABDOMEN AND PELVIS WITHOUT CONTRAST  Technique:  Multidetector CT imaging of the abdomen and pelvis was performed following the standard protocol without intravenous contrast.  Comparison: Chest and two views abdomen 10/23/2011 at 1804 hours.  Findings: Lung bases demonstrate some dependent atelectatic change. No pleural or pericardial effusion.  There is cardiomegaly.  Oral contrast  material in the distal esophagus could be due to poor motility and/or reflux.  There is wall thickening and inflammatory change about a loop of jejunum in the midline immediately anterior to the right common iliac artery.  There are a tiny locules of free air adjacent to this abnormal loop of small bowel.  There is some free fluid present.  The no pneumatosis or portal venous gas is identified. No focal fluid collection is identified.  There appear be some tiny gravel type stones layering dependently within the gallbladder.  The gallbladder is otherwise unremarkable. The liver, spleen, adrenal glands and pancreas appear normal.  A punctate nonobstructing stone is identified in the lower pole of the right kidney.  There is an exophytic lesion off the midpole of the left kidney measuring 1.9 cm with Hounsfield unit measurements of 22.2 which cannot be definitively characterized.  An additional smaller intraparenchymal low attenuating lesion is seen in the left kidney which also cannot be characterized.  The colon and appendix appear normal.  IMPRESSION:  1.  Free air within the abdomen with fluid present compatible with bowel perforation.  Free air is centered about a loop of jejunum which has thickened walls.  Differential considerations include inflammatory change and ischemia. 2.  Indeterminate lesion lower pole left kidney.  Non emergent renal ultrasound recommended for further characterization. 3.  Punctate nonobstructing stone lower pole right kidney. 4.  Likely gravel type gallstones without evidence of cholecystitis.  Critical Value/emergent results were called by telephone at the time of  interpretation on 10/23/2011 at 9:20 p.m. to Dr. Rhunette Croft, who verbally acknowledged these results.   Original Report Authenticated By: Bernadene Bell. Maricela Curet, M.D.    US Renal Port  10/24/2011  *RADIOLOGY REPORT*  Clinical Data: Follow-up renal lesions seen on CT.  RENAL/URINARY TRACT ULTRASOUND COMPLETE  Comparison:   10/23/2011 CT  Findings:  Right Kidney:  Measures 10.4 cm.  No hydronephrosis or focal abnormality.  Left Kidney:  Measures 11.9 cm.  Exophytic from the upper/interpolar region, there is a nearly anechoic lesion with increased through transmission that measures 1.7 cm. The margin near its origin from the renal parenchyma is ill defined for example on image 20. This is favored to be technical.  Subsequent images of the lesion more lateral to the renal parenchyma demonstrate well circumscribed margins. Within the interpolar kidney slightly inferior to the dominant lesion, there is a 1.2 cm nearly anechoic focus of increased through transmission, most in keeping with a cyst.  Bladder:  Decompressed by Foley catheter.  IMPRESSION: No hydronephrosis.  1.7 and 1.2 cm left renal lesions are most in keeping with mildly complex cysts. A 6-12 month follow-up to confirm stability is reasonable.   Original Report Authenticated By: Waneta Martins, M.D.    Dg Abd Acute W/chest  10/23/2011  *RADIOLOGY REPORT*  Clinical Data: Fever.  Abdominal pain radiating towards the groin.  ACUTE ABDOMEN SERIES (ABDOMEN 2 VIEW & CHEST 1 VIEW)  Comparison: 04/11/2003  Findings: On the chest radiograph, right retro cardiac density favors hiatal hernia.  Tortuous thoracic aorta noted.  There is suspected subsegmental atelectasis at the left lung base.  Faint linear edge noted along the right hemidiaphragm.  Although the appearance is not classic for free intraperitoneal gas, intraperitoneal gas becomes difficult to completely exclude given this appearance.  Accordingly, I recommend either a left side down lateral decubitus view the abdomen or abdomen CT for further characterization.  Gas and stool noted in the colon several air-fluid levels are present in nondilated central abdominal small bowel, nonspecific.  IMPRESSION:  1.  Several nonspecific air-fluid levels and central abdominal loops of nondilated small bowel, query ileus. 2. Faint  linear structure extending along the right hemidiaphragm is probably due to clothing and seems too thin to represent the right hemidiaphragm in the setting of free intraperitoneal gas. However, I do recommend a left side down lateral decubitus view of the abdomen for further confirmation that there is no free intraperitoneal gas.  Alternatively, abdomen CT could be utilized. 3.  Retrocardiac density favors hiatal hernia. 4.  Linear subsegmental atelectasis in the left lower lobe.   Original Report Authenticated By: Dellia Cloud, M.D.     Anti-infectives: Anti-infectives     Start     Dose/Rate Route Frequency Ordered Stop   10/24/11 0000  piperacillin-tazobactam (ZOSYN) IVPB 3.375 g       3.375 g 12.5 mL/hr over 240 Minutes Intravenous Every 8 hours 10/23/11 2356     10/23/11 2130   ciprofloxacin (CIPRO) IVPB 400 mg  Status:  Discontinued        400 mg 200 mL/hr over 60 Minutes Intravenous  Once 10/23/11 2121 10/24/11 0254   10/23/11 2130   metroNIDAZOLE (FLAGYL) IVPB 500 mg        500 mg 100 mL/hr over 60 Minutes Intravenous  Once 10/23/11 2121 10/23/11 2316          Assessment/Plan:   Patient Active Problem List  Diagnosis  . Perforated viscus  . Renal stones  .  Atrial fibrillation   s/p Procedure(s) (LRB) with comments: EXPLORATORY LAPAROTOMY (N/A) SMALL BOWEL RESECTION () - for small bowel perforation 1. S/p ex lap with SBR for perf diverticula  2. Post op ileus  Plan:  1. Continue NPO status 2. Cont NGT and bowel rest. IV abx. 3. Mobilize today 4. Foley has been dc'd  5. Follow labs   LOS: 2 days    Athony Coppa 10/25/2011

## 2011-10-25 NOTE — Progress Notes (Signed)
I have seen and examined the patient and agree with the assessment and plans. Will keep NG.  Will place wound VAC  Asia Dusenbury A. Magnus Ivan  MD, FACS

## 2011-10-26 DIAGNOSIS — R739 Hyperglycemia, unspecified: Secondary | ICD-10-CM | POA: Diagnosis present

## 2011-10-26 DIAGNOSIS — R69 Illness, unspecified: Secondary | ICD-10-CM

## 2011-10-26 LAB — GLUCOSE, CAPILLARY
Glucose-Capillary: 111 mg/dL — ABNORMAL HIGH (ref 70–99)
Glucose-Capillary: 112 mg/dL — ABNORMAL HIGH (ref 70–99)
Glucose-Capillary: 118 mg/dL — ABNORMAL HIGH (ref 70–99)
Glucose-Capillary: 126 mg/dL — ABNORMAL HIGH (ref 70–99)
Glucose-Capillary: 137 mg/dL — ABNORMAL HIGH (ref 70–99)
Glucose-Capillary: 145 mg/dL — ABNORMAL HIGH (ref 70–99)

## 2011-10-26 LAB — COMPREHENSIVE METABOLIC PANEL
ALT: 8 U/L (ref 0–53)
AST: 13 U/L (ref 0–37)
Albumin: 2.4 g/dL — ABNORMAL LOW (ref 3.5–5.2)
Alkaline Phosphatase: 69 U/L (ref 39–117)
BUN: 17 mg/dL (ref 6–23)
CO2: 29 mEq/L (ref 19–32)
Calcium: 8.6 mg/dL (ref 8.4–10.5)
Chloride: 99 mEq/L (ref 96–112)
Creatinine, Ser: 1.12 mg/dL (ref 0.50–1.35)
GFR calc Af Amer: 75 mL/min — ABNORMAL LOW (ref >90)
GFR calc non Af Amer: 65 mL/min — ABNORMAL LOW (ref >90)
Glucose, Bld: 134 mg/dL — ABNORMAL HIGH (ref 70–99)
Potassium: 3.6 mEq/L (ref 3.5–5.1)
Sodium: 135 mEq/L (ref 135–145)
Total Bilirubin: 1.3 mg/dL — ABNORMAL HIGH (ref 0.3–1.2)
Total Protein: 5.9 g/dL — ABNORMAL LOW (ref 6.0–8.3)

## 2011-10-26 LAB — CBC
HCT: 38.8 % — ABNORMAL LOW (ref 39.0–52.0)
Hemoglobin: 12.6 g/dL — ABNORMAL LOW (ref 13.0–17.0)
MCH: 29 pg (ref 26.0–34.0)
MCHC: 32.5 g/dL (ref 30.0–36.0)
MCV: 89.4 fL (ref 78.0–100.0)
Platelets: 162 10*3/uL (ref 150–400)
RBC: 4.34 MIL/uL (ref 4.22–5.81)
RDW: 13.7 % (ref 11.5–15.5)
WBC: 9 10*3/uL (ref 4.0–10.5)

## 2011-10-26 LAB — MAGNESIUM: Magnesium: 1.9 mg/dL (ref 1.5–2.5)

## 2011-10-26 NOTE — Progress Notes (Signed)
[facility]                                                                                Patient Demographics  Michael Mejia, is a 71 y.o. male  ZOX:[id number]  [id number]  DOB - 1940/04/20  Admit date - 10/23/2011  Admitting Physician Eduard Clos, MD  Outpatient Primary MD for the patient is No primary provider on file.  LOS - 3   Chief Complaint  Patient presents with  . 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 His other medical issues are atrial fibrillation and dyslipidemia he should appear stable.     1. 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 Daphine Deutscher general surgery - plan is to continue empiric IV Zosyn, continued n.p.o., NG tube for decompression, general surgery following closely. Patient still not passing gas and still has NG tube, continue IV fluids-we will consider TPN 10/27/2011 if no further passage of gas SURGEON-please narrow antibiotics as you deem appropriate-cultures are still pending. Per OP note he had non-foul-smelling peritonitis -does patient need about 14 days of Cipro Flagyl and can Zosyn be discontinued?     2. Incidental finding of non-obstructing renal stone and questionable lesion on the kidney on CT scan- ultrasound suggestive of complex cyst, will need outpatient urology follow up in 3-4 weeks.     3. History of atrial fibrillation - patient on Lopressor and  aspirin at home, currently unable to take due to n.p.o. status for #1 above, when necessary IV Lopressor ordered, if it becomes an issue we'll try IV digoxin or IV drip beta blocker if needed-currently stable and rate controlled A. fib on monitors     4. History of dyslipidemia - resume home dose Zocor once taking by mouth.     Code Status: Full  Family Communication: With the patient bedside on 10/24/2011  Disposition Plan: TBD    Procedures   1. On 10/23/2011 by Dr. Daphine Deutscher general surgery patient had Exploratory laparotomy with resection of perforated small bowel diverticula with primary anastomosis.  2. CT abdomen and pelvis  3. Renal ultrasound   Prognosis remains guarded.   Consults  Gen. surgery   Time Spent in minutes   25   Antibiotics    Anti-infectives     Start     Dose/Rate Route Frequency Ordered Stop   10/24/11 0000   piperacillin-tazobactam (ZOSYN) IVPB 3.375 g        3.375 g 12.5 mL/hr over 240 Minutes Intravenous Every 8 hours 10/23/11 2356     10/23/11 2130   ciprofloxacin (CIPRO) IVPB 400 mg  Status:  Discontinued  400 mg 200 mL/hr over 60 Minutes Intravenous  Once 10/23/11 2121 10/24/11 0254   10/23/11 2130   metroNIDAZOLE (FLAGYL) IVPB 500 mg        500 mg 100 mL/hr over 60 Minutes Intravenous  Once 10/23/11 2121 10/23/11 2316          Scheduled Meds:    . heparin  5,000 Units Subcutaneous Q8H  . pantoprazole (PROTONIX) IV  40 mg Intravenous Q24H  . piperacillin-tazobactam (ZOSYN)  IV  3.375 g Intravenous Q8H  . sodium chloride  3 mL Intravenous Q12H   Continuous Infusions:    . dextrose 5 % and 0.45 % NaCl with KCl 10 mEq/L 75 mL/hr at 10/26/11 0505   PRN Meds:.acetaminophen, metoprolol, morphine injection   DVT Prophylaxis   Heparin     Rhetta Mura M.D on 10/26/2011 at 6:28 PM  Between 7am to 7pm - Pager - [phone]  After 7pm go to [email or web address] - password [email or web address]  And look for the night coverage  person covering for me after hours  [facility] Office  [phone]    Subjective:   Michael Mejia today has, No headache, No chest pain, mild generalized abdominal pain - No Nausea, No new weakness tingling or numbness, No Cough - SOB.   Objective:   Filed Vitals:   10/25/11 1358 10/25/11 2123 10/26/11 0509 10/26/11 1521  BP: 118/65 131/68 119/77 127/78  Pulse: 98 84 94 90  Temp: 98.8 F (37.1 C) 99.5 F (37.5 C) 98.7 F (37.1 C) 98 F (36.7 C)  TempSrc: Oral Oral Oral Oral  Resp: 18 18 18 18   Height:      Weight:   87 kg (191 lb 12.8 oz)   SpO2: 94% 90% 91% 90%    Wt Readings from Last 3 Encounters:  10/26/11 87 kg (191 lb 12.8 oz)  10/26/11 87 kg (191 lb 12.8 oz)     Intake/Output Summary (Last 24 hours) at 10/26/11 1828 Last data filed at 10/26/11 1500  Gross per 24 hour  Intake   1710 ml  Output   1725 ml  Net    -15 ml    Exam Awake Alert, Oriented X 3, No new F.N deficits, Normal affect [place].AT,PERRAL Supple Neck,No JVD, No cervical lymphadenopathy appriciated.  Symmetrical Chest wall movement, Good air movement bilaterally, CTAB RRR,No Gallops,Rubs or new Murmurs, No Parasternal Heave No  B.Sounds, Abd Soft, some generalized tenderness, No organomegaly appriciated, No rebound or rigidity. No Cyanosis, Clubbing or edema, No new Rash or bruise     Data Review   Micro Results Recent Results (from the past 240 hour(s))  CULTURE, BLOOD (ROUTINE X 2)     Status: Normal (Preliminary result)   Collection Time   10/23/11 10:00 PM      Component Value Range Status Comment   Specimen Description BLOOD RIGHT ANTECUBITAL   Final    Special Requests BOTTLES DRAWN AEROBIC AND ANAEROBIC 5 CC EA   Final    Culture  Setup Time 10/24/2011 08:23   Final    Culture     Final    Value:        BLOOD CULTURE RECEIVED NO GROWTH TO DATE CULTURE WILL BE HELD FOR 5 DAYS BEFORE ISSUING A FINAL NEGATIVE REPORT   Report Status PENDING   Incomplete   CULTURE, BLOOD  (ROUTINE X 2)     Status: Normal (Preliminary result)   Collection Time   10/23/11 10:10 PM      Component  Value Range Status Comment   Specimen Description BLOOD RIGHT ARM   Final    Special Requests BOTTLES DRAWN AEROBIC ONLY 3 CC   Final    Culture  Setup Time 10/24/2011 08:23   Final    Culture     Final    Value:        BLOOD CULTURE RECEIVED NO GROWTH TO DATE CULTURE WILL BE HELD FOR 5 DAYS BEFORE ISSUING A FINAL NEGATIVE REPORT   Report Status PENDING   Incomplete   WOUND CULTURE     Status: Normal (Preliminary result)   Collection Time   10/24/11  3:39 AM      Component Value Range Status Comment   Specimen Description ABDOMEN   Final    Special Requests NONE   Final    Gram Stain     Final    Value: MODERATE WBC PRESENT, PREDOMINANTLY PMN     NO SQUAMOUS EPITHELIAL CELLS SEEN     NO ORGANISMS SEEN   Culture FEW GRAM NEGATIVE RODS   Final    Report Status PENDING   Incomplete   ANAEROBIC CULTURE     Status: Normal (Preliminary result)   Collection Time   10/24/11  3:44 AM      Component Value Range Status Comment   Specimen Description ABDOMEN   Final    Special Requests NONE   Final    Gram Stain     Final    Value: MODERATE WBC PRESENT, PREDOMINANTLY PMN     NO SQUAMOUS EPITHELIAL CELLS SEEN     NO ORGANISMS SEEN   Culture     Final    Value: NO ANAEROBES ISOLATED; CULTURE IN PROGRESS FOR 5 DAYS   Report Status PENDING   Incomplete     Radiology Reports Ct Abdomen Pelvis Wo Contrast  10/23/2011  *RADIOLOGY REPORT*  Clinical Data: Right lower quadrant pain.  CT ABDOMEN AND PELVIS WITHOUT CONTRAST  Technique:  Multidetector CT imaging of the abdomen and pelvis was performed following the standard protocol without intravenous contrast.  Comparison: Chest and two views abdomen 10/23/2011 at 1804 hours.  Findings: Lung bases demonstrate some dependent atelectatic change. No pleural or pericardial effusion.  There is cardiomegaly.  Oral contrast material in the distal  esophagus could be due to poor motility and/or reflux.  There is wall thickening and inflammatory change about a loop of jejunum in the midline immediately anterior to the right common iliac artery.  There are a tiny locules of free air adjacent to this abnormal loop of small bowel.  There is some free fluid present.  The no pneumatosis or portal venous gas is identified. No focal fluid collection is identified.  There appear be some tiny gravel type stones layering dependently within the gallbladder.  The gallbladder is otherwise unremarkable. The liver, spleen, adrenal glands and pancreas appear normal.  A punctate nonobstructing stone is identified in the lower pole of the right kidney.  There is an exophytic lesion off the midpole of the left kidney measuring 1.9 cm with Hounsfield unit measurements of 22.2 which cannot be definitively characterized.  An additional smaller intraparenchymal low attenuating lesion is seen in the left kidney which also cannot be characterized.  The colon and appendix appear normal.  IMPRESSION:  1.  Free air within the abdomen with fluid present compatible with bowel perforation.  Free air is centered about a loop of jejunum which has thickened walls.  Differential considerations include inflammatory change and ischemia.  2.  Indeterminate lesion lower pole left kidney.  Non emergent renal ultrasound recommended for further characterization. 3.  Punctate nonobstructing stone lower pole right kidney. 4.  Likely gravel type gallstones without evidence of cholecystitis.  Critical Value/emergent results were called by telephone at the time of interpretation on 10/23/2011 at 9:20 p.m. to Dr. Rhunette Croft, who verbally acknowledged these results.   Original Report Authenticated By: Bernadene Bell. D'ALESSIO, M.D.    Dg Abd Acute W/chest  10/23/2011  *RADIOLOGY REPORT*  Clinical Data: Fever.  Abdominal pain radiating towards the groin.  ACUTE ABDOMEN SERIES (ABDOMEN 2 VIEW & CHEST 1 VIEW)   Comparison: 04/11/2003  Findings: On the chest radiograph, right retro cardiac density favors hiatal hernia.  Tortuous thoracic aorta noted.  There is suspected subsegmental atelectasis at the left lung base.  Faint linear edge noted along the right hemidiaphragm.  Although the appearance is not classic for free intraperitoneal gas, intraperitoneal gas becomes difficult to completely exclude given this appearance.  Accordingly, I recommend either a left side down lateral decubitus view the abdomen or abdomen CT for further characterization.  Gas and stool noted in the colon several air-fluid levels are present in nondilated central abdominal small bowel, nonspecific.  IMPRESSION:  1.  Several nonspecific air-fluid levels and central abdominal loops of nondilated small bowel, query ileus. 2. Faint linear structure extending along the right hemidiaphragm is probably due to clothing and seems too thin to represent the right hemidiaphragm in the setting of free intraperitoneal gas. However, I do recommend a left side down lateral decubitus view of the abdomen for further confirmation that there is no free intraperitoneal gas.  Alternatively, abdomen CT could be utilized. 3.  Retrocardiac density favors hiatal hernia. 4.  Linear subsegmental atelectasis in the left lower lobe.   Original Report Authenticated By: Dellia Cloud, M.D.     CBC  Lab 10/26/11 0429 10/25/11 0425 10/24/11 0451 10/23/11 1755  WBC 9.0 7.8 6.6 8.7  HGB 12.6* 12.8* 12.9* 13.8  HCT 38.8* 38.5* 38.5* 40.7  PLT 162 155 141* 166  MCV 89.4 89.5 87.3 87.5  MCH 29.0 29.8 29.3 29.7  MCHC 32.5 33.2 33.5 33.9  RDW 13.7 14.1 14.0 13.6  LYMPHSABS -- -- 0.7 0.7  MONOABS -- -- 0.5 0.6  EOSABS -- -- 0.0 0.0  BASOSABS -- -- 0.0 0.0  BANDABS -- -- -- --    Chemistries   Lab 10/26/11 0429 10/25/11 0425 10/24/11 0451 10/23/11 1755  NA 135 134* 136 136  K 3.6 4.0 3.9 3.8  CL 99 100 102 101  CO2 29 26 24 24   GLUCOSE 134* 114* 146* 136*   BUN 17 23 20 22   CREATININE 1.12 1.19 1.01 0.95  CALCIUM 8.6 8.4 7.8* 8.9  MG 1.9 1.8 -- --  AST 13 17 14 18   ALT 8 10 8 10   ALKPHOS 69 63 44 58  BILITOT 1.3* 1.4* 1.6* 1.1   ------------------------------------------------------------------------------------------------------------------ estimated creatinine clearance is 67 ml/min (by C-G formula based on Cr of 1.12). ------------------------------------------------------------------------------------------------------------------  [facility] 10/26/11 0429  HGBA1C 6.2*   ------------------------------------------------------------------------------------------------------------------ No results found for this basename: CHOL:2,HDL:2,LDLCALC:2,TRIG:2,CHOLHDL:2,LDLDIRECT:2 in the last 72 hours ------------------------------------------------------------------------------------------------------------------ No results found for this basename: TSH,T4TOTAL,FREET3,T3FREE,THYROIDAB in the last 72 hours ------------------------------------------------------------------------------------------------------------------ No results found for this basename: VITAMINB12:2,FOLATE:2,FERRITIN:2,TIBC:2,IRON:2,RETICCTPCT:2 in the last 72 hours  Coagulation profile  Lab 10/23/11 1755  INR 1.05  PROTIME --    No results found for this basename: DDIMER:2 in the last 72 hours  Cardiac Enzymes  Lab 10/23/11 1755  CKMB --  TROPONINI <0.30  MYOGLOBIN --   ------------------------------------------------------------------------------------------------------------------ No components found with this basename: POCBNP:3

## 2011-10-26 NOTE — Progress Notes (Signed)
PT Cancellation Note  Treatment cancelled today due to pt stated he just walked in the hallway. He wants to rest now and agreed to trying to walk again later this afternoon. Will follow.Tamala Ser 10/26/2011, 11:11 AM [phone]

## 2011-10-26 NOTE — Progress Notes (Signed)
Patient ID: Michael Mejia, male   DOB: [birth year], 71 y.o.   MRN: [id number] 2 Days Post-Op  Subjective: Pt having some pain, just had VAC placed.  Otherwise no flatus yet.  Objective: Vital signs in last 24 hours: Temp:  [98.7 F (37.1 C)-99.5 F (37.5 C)] 98.7 F (37.1 C) (09/18 0509) Pulse Rate:  [84-98] 94  (09/18 0509) Resp:  [17-19] 18  (09/18 0509) BP: (118-131)/(65-77) 119/77 mmHg (09/18 0509) SpO2:  [90 %-95 %] 91 % (09/18 0509) Weight:  [191 lb 12.8 oz (87 kg)] 191 lb 12.8 oz (87 kg) (09/18 0509) Last BM Date: 10/23/11  Intake/Output from previous day: 09/17 0701 - 09/18 0700 In: 1140 [I.V.:900; NG/GT:90; IV Piggyback:150] Out: 1575 [Urine:1400; Emesis/NG output:175] Intake/Output this shift:    PE: Abd: soft, distended some, absent BS, tender appropriately, VAC in place, NGT with bilious output.  Lab Results:   Basename 10/26/11 0429 10/25/11 0425  WBC 9.0 7.8  HGB 12.6* 12.8*  HCT 38.8* 38.5*  PLT 162 155   BMET  Basename 10/26/11 0429 10/25/11 0425  NA 135 134*  K 3.6 4.0  CL 99 100  CO2 29 26  GLUCOSE 134* 114*  BUN 17 23  CREATININE 1.12 1.19  CALCIUM 8.6 8.4   PT/INR  Basename 10/23/11 1755  LABPROT 13.9  INR 1.05   CMP     Component Value Date/Time   NA 135 10/26/2011 0429   K 3.6 10/26/2011 0429   CL 99 10/26/2011 0429   CO2 29 10/26/2011 0429   GLUCOSE 134* 10/26/2011 0429   BUN 17 10/26/2011 0429   CREATININE 1.12 10/26/2011 0429   CALCIUM 8.6 10/26/2011 0429   PROT 5.9* 10/26/2011 0429   ALBUMIN 2.4* 10/26/2011 0429   AST 13 10/26/2011 0429   ALT 8 10/26/2011 0429   ALKPHOS 69 10/26/2011 0429   BILITOT 1.3* 10/26/2011 0429   GFRNONAA 65* 10/26/2011 0429   GFRAA 75* 10/26/2011 0429   Lipase     Component Value Date/Time   LIPASE 41 10/23/2011 1755       Studies/Results: US Renal Port  10/24/2011  *RADIOLOGY REPORT*  Clinical Data: Follow-up renal lesions seen on CT.  RENAL/URINARY TRACT ULTRASOUND COMPLETE  Comparison:  10/23/2011 CT   Findings:  Right Kidney:  Measures 10.4 cm.  No hydronephrosis or focal abnormality.  Left Kidney:  Measures 11.9 cm.  Exophytic from the upper/interpolar region, there is a nearly anechoic lesion with increased through transmission that measures 1.7 cm. The margin near its origin from the renal parenchyma is ill defined for example on image 20. This is favored to be technical.  Subsequent images of the lesion more lateral to the renal parenchyma demonstrate well circumscribed margins. Within the interpolar kidney slightly inferior to the dominant lesion, there is a 1.2 cm nearly anechoic focus of increased through transmission, most in keeping with a cyst.  Bladder:  Decompressed by Foley catheter.  IMPRESSION: No hydronephrosis.  1.7 and 1.2 cm left renal lesions are most in keeping with mildly complex cysts. A 6-12 month follow-up to confirm stability is reasonable.   Original Report Authenticated By: Waneta Martins, M.D.     Anti-infectives: Anti-infectives     Start     Dose/Rate Route Frequency Ordered Stop   10/24/11 0000  piperacillin-tazobactam (ZOSYN) IVPB 3.375 g       3.375 g 12.5 mL/hr over 240 Minutes Intravenous Every 8 hours 10/23/11 2356     10/23/11 2130   ciprofloxacin (  CIPRO) IVPB 400 mg  Status:  Discontinued        400 mg 200 mL/hr over 60 Minutes Intravenous  Once 10/23/11 2121 10/24/11 0254   10/23/11 2130   metroNIDAZOLE (FLAGYL) IVPB 500 mg        500 mg 100 mL/hr over 60 Minutes Intravenous  Once 10/23/11 2121 10/23/11 2316           Assessment/Plan  1. S/p ex lap with SBR for perf SB 2. Post op ileus 3. A fib  Plan: 1. Cont NPO and NGT for now and await bowel function. 2. Cont wound VAC.  Changes will be M,W,F.   LOS: 3 days    Diallo Ponder E 10/26/2011, 8:39 AM Pager: [phone]

## 2011-10-26 NOTE — Progress Notes (Signed)
I have seen and examined the patient and agree with the assessment and plans.  Siddhanth Denk A. Bowen Goyal  MD, FACS  

## 2011-10-27 DIAGNOSIS — E876 Hypokalemia: Secondary | ICD-10-CM

## 2011-10-27 LAB — CALCIUM, IONIZED: Calcium, Ion: 1.14 mmol/L (ref 1.12–1.32)

## 2011-10-27 LAB — COMPREHENSIVE METABOLIC PANEL
AST: 11 U/L (ref 0–37)
Albumin: 2 g/dL — ABNORMAL LOW (ref 3.5–5.2)
CO2: 27 mEq/L (ref 19–32)
Calcium: 8.2 mg/dL — ABNORMAL LOW (ref 8.4–10.5)
Creatinine, Ser: 1.05 mg/dL (ref 0.50–1.35)
GFR calc non Af Amer: 70 mL/min — ABNORMAL LOW (ref >90)

## 2011-10-27 LAB — CBC
MCH: 29.4 pg (ref 26.0–34.0)
MCHC: 33.3 g/dL (ref 30.0–36.0)
MCV: 88.2 fL (ref 78.0–100.0)
Platelets: 161 10*3/uL (ref 150–400)
RDW: 13.4 % (ref 11.5–15.5)

## 2011-10-27 LAB — GLUCOSE, CAPILLARY
Glucose-Capillary: 131 mg/dL — ABNORMAL HIGH (ref 70–99)
Glucose-Capillary: 99 mg/dL (ref 70–99)
Glucose-Capillary: 115 mg/dL — ABNORMAL HIGH (ref 70–99)

## 2011-10-27 LAB — WOUND CULTURE

## 2011-10-27 MED ORDER — CIPROFLOXACIN IN D5W 400 MG/200ML IV SOLN
400.0000 mg | Freq: Two times a day (BID) | INTRAVENOUS | Status: DC
  Administered 2011-10-27 – 2011-10-29 (×5): 400 mg via INTRAVENOUS
  Filled 2011-10-27 (×6): qty 200

## 2011-10-27 MED ORDER — METRONIDAZOLE IN NACL 5-0.79 MG/ML-% IV SOLN
500.0000 mg | Freq: Three times a day (TID) | INTRAVENOUS | Status: DC
  Administered 2011-10-27 – 2011-10-29 (×7): 500 mg via INTRAVENOUS
  Filled 2011-10-27 (×9): qty 100

## 2011-10-27 MED ORDER — POTASSIUM CHLORIDE 10 MEQ/100ML IV SOLN
10.0000 meq | INTRAVENOUS | Status: AC
  Administered 2011-10-27 (×4): 10 meq via INTRAVENOUS
  Filled 2011-10-27 (×4): qty 100

## 2011-10-27 NOTE — Progress Notes (Signed)
Patient ID: Michael Mejia, male   DOB: 1940/09/13, 71 y.o.   MRN: [id number] 3 Days Post-Op  Subjective: Pt tired and sleeping.  Wife states he has been ambulating well.  No flatus yet  Objective: Vital signs in last 24 hours: Temp:  [98 F (36.7 C)-98.8 F (37.1 C)] 98.3 F (36.8 C) (09/19 0556) Pulse Rate:  [73-90] 73  (09/19 0556) Resp:  [18] 18  (09/19 0556) BP: (116-131)/(78-79) 116/78 mmHg (09/19 0556) SpO2:  [90 %-97 %] 93 % (09/19 0556) Weight:  [189 lb 3.2 oz (85.821 kg)] 189 lb 3.2 oz (85.821 kg) (09/19 0556) Last BM Date: 10/23/11  Intake/Output from previous day: 09/18 0701 - 09/19 0700 In: 2417.5 [I.V.:1787.5; NG/GT:470; IV Piggyback:160] Out: 2000 [Urine:1900; Emesis/NG output:100] Intake/Output this shift:    PE: Abd: some increased distention, absent BS, bilious output from NGT, wound VAC in place  Lab Results:   [facility] 10/27/11 0407 10/26/11 0429  WBC 6.3 9.0  HGB 11.2* 12.6*  HCT 33.6* 38.8*  PLT 161 162   BMET  Basename 10/27/11 0407 10/26/11 0429  NA 134* 135  K 3.3* 3.6  CL 100 99  CO2 27 29  GLUCOSE 125* 134*  BUN 15 17  CREATININE 1.05 1.12  CALCIUM 8.2* 8.6   PT/INR No results found for this basename: LABPROT:2,INR:2 in the last 72 hours CMP     Component Value Date/Time   NA 134* 10/27/2011 0407   K 3.3* 10/27/2011 0407   CL 100 10/27/2011 0407   CO2 27 10/27/2011 0407   GLUCOSE 125* 10/27/2011 0407   BUN 15 10/27/2011 0407   CREATININE 1.05 10/27/2011 0407   CALCIUM 8.2* 10/27/2011 0407   PROT 5.5* 10/27/2011 0407   ALBUMIN 2.0* 10/27/2011 0407   AST 11 10/27/2011 0407   ALT 7 10/27/2011 0407   ALKPHOS 50 10/27/2011 0407   BILITOT 0.9 10/27/2011 0407   GFRNONAA 70* 10/27/2011 0407   GFRAA 81* 10/27/2011 0407   Lipase     Component Value Date/Time   LIPASE 41 10/23/2011 1755       Studies/Results: No results found.  Anti-infectives: Anti-infectives     Start     Dose/Rate Route Frequency Ordered Stop   10/27/11 0845    ciprofloxacin (CIPRO) IVPB 400 mg        400 mg 200 mL/hr over 60 Minutes Intravenous Every 12 hours 10/27/11 0840     10/27/11 0845   metroNIDAZOLE (FLAGYL) IVPB 500 mg        500 mg 100 mL/hr over 60 Minutes Intravenous Every 8 hours 10/27/11 0840     10/24/11 0000   piperacillin-tazobactam (ZOSYN) IVPB 3.375 g  Status:  Discontinued        3.375 g 12.5 mL/hr over 240 Minutes Intravenous Every 8 hours 10/23/11 2356 10/27/11 0840   10/23/11 2130   ciprofloxacin (CIPRO) IVPB 400 mg  Status:  Discontinued        400 mg 200 mL/hr over 60 Minutes Intravenous  Once 10/23/11 2121 10/24/11 0254   10/23/11 2130   metroNIDAZOLE (FLAGYL) IVPB 500 mg        500 mg 100 mL/hr over 60 Minutes Intravenous  Once 10/23/11 2121 10/23/11 2316           Assessment/Plan  1. S/p ex lap with SBR for perf SB diverticulum 2. Post op ileus 3. Hypokalemia 4. A fib  Plan: 1. Will dc zosyn and change to cipro and flagyl.  Cultures reveal Mejia.  Coli which is sensitive. 2. Replace K as this can cause his ileus to worsen 3. Cont NGT, NPO, and bowel rest. Await bowel function 4. Cont to mobilize and pulm toilet.   LOS: 4 days    Michael Mejia 10/27/2011, 8:40 AM Pager: [phone]

## 2011-10-27 NOTE — Progress Notes (Signed)
I have seen and examined the patient and agree with the assessment and plans.  Hopefully ileus will resolve soon.  Johnson Arizola A. Magnus Ivan  MD, FACS

## 2011-10-27 NOTE — Progress Notes (Signed)
Patient had a 2.10 second pause on tele. With patient during this time. Patient asymptomatic, in bed, no reports of chest pain or shortness of breath besides his surgical, abdominal pain. Will continue to monitor patient. NP on call paged.

## 2011-10-27 NOTE — Progress Notes (Signed)
Patient's wife reported that patient patient flatus around 1025. Will continue to monitor

## 2011-10-27 NOTE — Progress Notes (Signed)
Physical Therapy Treatment Patient Details Name: ABDULAI Mejia MRN: [id number] DOB: 07/09/1940 Today's Date: 10/27/2011 Time: 1115-1140 PT Time Calculation (min): 25 min  PT Assessment / Plan / Recommendation Comments on Treatment Session  POD #3 Exp Lap, bowel resection with VAC.  Assisted pt OOB to amb in hallway then reyrned to bed.  Spouse present and very helpful.  Pt plans to return home with spouse.    Follow Up Recommendations  No PT follow up    Barriers to Discharge        Equipment Recommendations  Rolling walker with 5" wheels    Recommendations for Other Services    Frequency Min 3X/week   Plan Discharge plan remains appropriate    Precautions / Restrictions Precautions Precaution Comments: Vac, NPO    Pertinent Vitals/Pain C/o mild ABD discomfort which increased with act Pain meds requested    Mobility  Bed Mobility Bed Mobility: Supine to Sit;Sit to Supine Supine to Sit: 4: Min guard Sit to Supine: 4: Min guard Details for Bed Mobility Assistance: increased time  Transfers Transfers: Sit to Stand;Stand to Sit Sit to Stand: 4: Min guard;From bed Stand to Sit: 4: Min guard;To bed Details for Transfer Assistance: increased time due to ABD incision and equipment lines.  Ambulation/Gait Ambulation/Gait Assistance: 1: +2 Total assist Ambulation/Gait: Patient Percentage: 80% Ambulation Distance (Feet): 450 Feet Assistive device: Rolling walker Ambulation/Gait Assistance Details: Used RW for mild support due to ABD surgery. Pt feels he won't need one when he gets home. Gait Pattern: Step-through pattern;Decreased stride length;Trunk flexed Gait velocity: decreased     PT Goals              progressing    Visit Information  Last PT Received On: 10/27/11 Assistance Needed: +2 (equipment)                   End of Session PT - End of Session Equipment Utilized During Treatment: Gait belt Activity Tolerance: Patient tolerated treatment well Patient  left: in bed;with call bell/phone within reach;with family/visitor present   Michael Mejia  PTA [facility]  Acute  Rehab Pager     [phone]

## 2011-10-27 NOTE — Progress Notes (Signed)
[facility]                                                                                Patient Demographics  Michael Mejia, is a 71 y.o. male  ZOX:[id number]  WJX:[id number]  DOB - 1940-09-28  Admit date - 10/23/2011  Admitting Physician Eduard Clos, MD  Outpatient Primary MD for the patient is No primary provider on file.  LOS - 4   Chief Complaint  Patient presents with  . 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 His other medical issues are atrial fibrillation and dyslipidemia he should appear stable.     1. 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 Daphine Deutscher general surgery - plan is to continue empiric IV Zosyn, continued n.p.o., NG tube for decompression, general surgery following closely. Patient passed flatus NG tube clamped 9:19 PM  will defer to surgeon as to when to graduate diet-potentially tomorrow morning with clear his Patient currently on only 14 days of Cipro Flagyl-Zosyn discontinued 10/27/2011 a.m.   2. Incidental finding of non-obstructing renal stone and questionable lesion on the kidney on CT scan- ultrasound suggestive of complex cyst, will need outpatient urology follow up in 3-4 weeks.   3. History of atrial fibrillation - patient on Lopressor and aspirin at home, currently unable to take due to n.p.o. status for #1 above, when necessary IV Lopressor ordered, if it becomes  an issue we'll try IV digoxin or IV drip beta blocker if needed-currently stable and rate controlled A. fib on monitors   4. History of dyslipidemia - resume home dose Zocor once taking by mouth.   5. Hypokalemia-replacing by IV   Code Status: Full  Family Communication: With the patient bedside on 10/24/2011  Disposition Plan: TBD    Procedures   1. On 10/23/2011 by Dr. Daphine Deutscher general surgery patient had Exploratory laparotomy with resection of perforated small bowel diverticula with primary anastomosis.  2. CT abdomen and pelvis  3. Renal ultrasound   Prognosis good   Consults  Gen. surgery   Time Spent in minutes   15   Antibiotics    Anti-infectives     Start     Dose/Rate Route Frequency Ordered Stop   10/27/11 1200   metroNIDAZOLE (FLAGYL) IVPB 500 mg        500 mg 100 mL/hr over 60 Minutes Intravenous Every 8 hours 10/27/11 0840     10/27/11 1000   ciprofloxacin (CIPRO) IVPB 400 mg        400 mg 200 mL/hr over 60 Minutes Intravenous Every 12 hours 10/27/11 0840     10/24/11 0000   piperacillin-tazobactam (ZOSYN) IVPB  3.375 g  Status:  Discontinued        3.375 g 12.5 mL/hr over 240 Minutes Intravenous Every 8 hours 10/23/11 2356 10/27/11 0840   10/23/11 2130   ciprofloxacin (CIPRO) IVPB 400 mg  Status:  Discontinued        400 mg 200 mL/hr over 60 Minutes Intravenous  Once 10/23/11 2121 10/24/11 0254   10/23/11 2130   metroNIDAZOLE (FLAGYL) IVPB 500 mg        500 mg 100 mL/hr over 60 Minutes Intravenous  Once 10/23/11 2121 10/23/11 2316          Scheduled Meds:    . ciprofloxacin  400 mg Intravenous Q12H  . heparin  5,000 Units Subcutaneous Q8H  . metronidazole  500 mg Intravenous Q8H  . pantoprazole (PROTONIX) IV  40 mg Intravenous Q24H  . potassium chloride  10 mEq Intravenous Q1 Hr x 4  . sodium chloride  3 mL Intravenous Q12H  . DISCONTD: piperacillin-tazobactam (ZOSYN)  IV  3.375 g Intravenous Q8H   Continuous Infusions:    .  dextrose 5 % and 0.45 % NaCl with KCl 10 mEq/L 75 mL/hr at 10/26/11 1850   PRN Meds:.acetaminophen, metoprolol, morphine injection   DVT Prophylaxis   Heparin     Rhetta Mura M.D on 10/27/2011 at 6:12 PM  Between 7am to 7pm - Pager - [phone]  After 7pm go to [email or web address] - password [email or web address]  And look for the night coverage person covering for me after hours  [facility] Office  [phone]    Subjective:   Michael Mejia today is doing well with some mild abdominal pain probably 5 on 10 more with movement. Patient still has a wound VAC and his ambulance around the nursing station. Physical therapy signed off 10/27/2011 as patient is doing so well. Patient passed flatus this morning. Patient has no nausea no vomiting no stool  Objective:   Filed Vitals:   10/26/11 0509 10/26/11 1521 10/26/11 2049 10/27/11 0556  BP: 119/77 127/78 131/79 116/78  Pulse: 94 90 84 73  Temp: 98.7 F (37.1 C) 98 F (36.7 C) 98.8 F (37.1 C) 98.3 F (36.8 C)  TempSrc: Oral Oral Oral Oral  Resp: 18 18 18 18   Height:      Weight: 87 kg (191 lb 12.8 oz)   85.821 kg (189 lb 3.2 oz)  SpO2: 91% 90% 97% 93%    Wt Readings from Last 3 Encounters:  10/27/11 85.821 kg (189 lb 3.2 oz)  10/27/11 85.821 kg (189 lb 3.2 oz)     Intake/Output Summary (Last 24 hours) at 10/27/11 1812 Last data filed at 10/27/11 1522  Gross per 24 hour  Intake 2347.5 ml  Output   2150 ml  Net  197.5 ml    Exam Awake Alert, Oriented X 3, No new F.N deficits, Normal affect [place].AT,PERRAL Supple Neck,No JVD, No cervical lymphadenopathy appriciated.  Symmetrical Chest wall movement, Good air movement bilaterally, CTAB RRR,No Gallops,Rubs or new Murmurs, No Parasternal Heave No  B.Sounds, Abd Soft, some generalized tenderness, No organomegaly appriciated, No rebound or rigidity. No Cyanosis, Clubbing or edema, No new Rash or bruise     Data Review   Micro Results Recent Results (from the past  240 hour(s))  CULTURE, BLOOD (ROUTINE X 2)     Status: Normal (Preliminary result)   Collection Time   10/23/11 10:00 PM      Component Value Range Status Comment   Specimen Description BLOOD RIGHT ANTECUBITAL  Final    Special Requests BOTTLES DRAWN AEROBIC AND ANAEROBIC 5 CC EA   Final    Culture  Setup Time 10/24/2011 08:23   Final    Culture     Final    Value:        BLOOD CULTURE RECEIVED NO GROWTH TO DATE CULTURE WILL BE HELD FOR 5 DAYS BEFORE ISSUING A FINAL NEGATIVE REPORT   Report Status PENDING   Incomplete   CULTURE, BLOOD (ROUTINE X 2)     Status: Normal (Preliminary result)   Collection Time   10/23/11 10:10 PM      Component Value Range Status Comment   Specimen Description BLOOD RIGHT ARM   Final    Special Requests BOTTLES DRAWN AEROBIC ONLY 3 CC   Final    Culture  Setup Time 10/24/2011 08:23   Final    Culture     Final    Value:        BLOOD CULTURE RECEIVED NO GROWTH TO DATE CULTURE WILL BE HELD FOR 5 DAYS BEFORE ISSUING A FINAL NEGATIVE REPORT   Report Status PENDING   Incomplete   WOUND CULTURE     Status: Normal   Collection Time   10/24/11  3:39 AM      Component Value Range Status Comment   Specimen Description ABDOMEN   Final    Special Requests NONE   Final    Gram Stain     Final    Value: MODERATE WBC PRESENT, PREDOMINANTLY PMN     NO SQUAMOUS EPITHELIAL CELLS SEEN     NO ORGANISMS SEEN   Culture FEW ESCHERICHIA COLI   Final    Report Status 10/27/2011 FINAL   Final    Organism ID, Bacteria ESCHERICHIA COLI   Final   ANAEROBIC CULTURE     Status: Normal (Preliminary result)   Collection Time   10/24/11  3:44 AM      Component Value Range Status Comment   Specimen Description ABDOMEN   Final    Special Requests NONE   Final    Gram Stain     Final    Value: MODERATE WBC PRESENT, PREDOMINANTLY PMN     NO SQUAMOUS EPITHELIAL CELLS SEEN     NO ORGANISMS SEEN   Culture     Final    Value: NO ANAEROBES ISOLATED; CULTURE IN PROGRESS FOR 5 DAYS    Report Status PENDING   Incomplete     Radiology Reports Ct Abdomen Pelvis Wo Contrast  10/23/2011  *RADIOLOGY REPORT*  Clinical Data: Right lower quadrant pain.  CT ABDOMEN AND PELVIS WITHOUT CONTRAST  Technique:  Multidetector CT imaging of the abdomen and pelvis was performed following the standard protocol without intravenous contrast.  Comparison: Chest and two views abdomen 10/23/2011 at 1804 hours.  Findings: Lung bases demonstrate some dependent atelectatic change. No pleural or pericardial effusion.  There is cardiomegaly.  Oral contrast material in the distal esophagus could be due to poor motility and/or reflux.  There is wall thickening and inflammatory change about a loop of jejunum in the midline immediately anterior to the right common iliac artery.  There are a tiny locules of free air adjacent to this abnormal loop of small bowel.  There is some free fluid present.  The no pneumatosis or portal venous gas is identified. No focal fluid collection is identified.  There appear be some tiny gravel type stones layering dependently within the gallbladder.  The gallbladder is otherwise unremarkable.  The liver, spleen, adrenal glands and pancreas appear normal.  A punctate nonobstructing stone is identified in the lower pole of the right kidney.  There is an exophytic lesion off the midpole of the left kidney measuring 1.9 cm with Hounsfield unit measurements of 22.2 which cannot be definitively characterized.  An additional smaller intraparenchymal low attenuating lesion is seen in the left kidney which also cannot be characterized.  The colon and appendix appear normal.  IMPRESSION:  1.  Free air within the abdomen with fluid present compatible with bowel perforation.  Free air is centered about a loop of jejunum which has thickened walls.  Differential considerations include inflammatory change and ischemia. 2.  Indeterminate lesion lower pole left kidney.  Non emergent renal ultrasound recommended  for further characterization. 3.  Punctate nonobstructing stone lower pole right kidney. 4.  Likely gravel type gallstones without evidence of cholecystitis.  Critical Value/emergent results were called by telephone at the time of interpretation on 10/23/2011 at 9:20 p.m. to Dr. Rhunette Croft, who verbally acknowledged these results.   Original Report Authenticated By: Bernadene Bell. D'ALESSIO, M.D.    Dg Abd Acute W/chest  10/23/2011  *RADIOLOGY REPORT*  Clinical Data: Fever.  Abdominal pain radiating towards the groin.  ACUTE ABDOMEN SERIES (ABDOMEN 2 VIEW & CHEST 1 VIEW)  Comparison: 04/11/2003  Findings: On the chest radiograph, right retro cardiac density favors hiatal hernia.  Tortuous thoracic aorta noted.  There is suspected subsegmental atelectasis at the left lung base.  Faint linear edge noted along the right hemidiaphragm.  Although the appearance is not classic for free intraperitoneal gas, intraperitoneal gas becomes difficult to completely exclude given this appearance.  Accordingly, I recommend either a left side down lateral decubitus view the abdomen or abdomen CT for further characterization.  Gas and stool noted in the colon several air-fluid levels are present in nondilated central abdominal small bowel, nonspecific.  IMPRESSION:  1.  Several nonspecific air-fluid levels and central abdominal loops of nondilated small bowel, query ileus. 2. Faint linear structure extending along the right hemidiaphragm is probably due to clothing and seems too thin to represent the right hemidiaphragm in the setting of free intraperitoneal gas. However, I do recommend a left side down lateral decubitus view of the abdomen for further confirmation that there is no free intraperitoneal gas.  Alternatively, abdomen CT could be utilized. 3.  Retrocardiac density favors hiatal hernia. 4.  Linear subsegmental atelectasis in the left lower lobe.   Original Report Authenticated By: Dellia Cloud, M.D.     CBC  Lab  10/27/11 0407 10/26/11 0429 10/25/11 0425 10/24/11 0451 10/23/11 1755  WBC 6.3 9.0 7.8 6.6 8.7  HGB 11.2* 12.6* 12.8* 12.9* 13.8  HCT 33.6* 38.8* 38.5* 38.5* 40.7  PLT 161 162 155 141* 166  MCV 88.2 89.4 89.5 87.3 87.5  MCH 29.4 29.0 29.8 29.3 29.7  MCHC 33.3 32.5 33.2 33.5 33.9  RDW 13.4 13.7 14.1 14.0 13.6  LYMPHSABS -- -- -- 0.7 0.7  MONOABS -- -- -- 0.5 0.6  EOSABS -- -- -- 0.0 0.0  BASOSABS -- -- -- 0.0 0.0  BANDABS -- -- -- -- --    Chemistries   Lab 10/27/11 0407 10/26/11 0429 10/25/11 0425 10/24/11 0451 10/23/11 1755  NA 134* 135 134* 136 136  K 3.3* 3.6 4.0 3.9 3.8  CL 100 99 100 102 101  CO2 27 29 26 24 24   GLUCOSE 125* 134* 114* 146* 136*  BUN 15 17 23 20  22  CREATININE 1.05 1.12 1.19 1.01 0.95  CALCIUM 8.2* 8.6 8.4 7.8* 8.9  MG 1.9 1.9 1.8 -- --  AST 11 13 17 14 18   ALT 7 8 10 8 10   ALKPHOS 50 69 63 44 58  BILITOT 0.9 1.3* 1.4* 1.6* 1.1   ------------------------------------------------------------------------------------------------------------------ estimated creatinine clearance is 71 ml/min (by C-G formula based on Cr of 1.05). ------------------------------------------------------------------------------------------------------------------  [facility] 10/26/11 0429  HGBA1C 6.2*   ------------------------------------------------------------------------------------------------------------------ No results found for this basename: CHOL:2,HDL:2,LDLCALC:2,TRIG:2,CHOLHDL:2,LDLDIRECT:2 in the last 72 hours ------------------------------------------------------------------------------------------------------------------ No results found for this basename: TSH,T4TOTAL,FREET3,T3FREE,THYROIDAB in the last 72 hours ------------------------------------------------------------------------------------------------------------------ No results found for this basename: VITAMINB12:2,FOLATE:2,FERRITIN:2,TIBC:2,IRON:2,RETICCTPCT:2 in the last 72 hours  Coagulation  profile  Lab 10/23/11 1755  INR 1.05  PROTIME --    No results found for this basename: DDIMER:2 in the last 72 hours  Cardiac Enzymes  Lab 10/23/11 1755  CKMB --  TROPONINI <0.30  MYOGLOBIN --   ------------------------------------------------------------------------------------------------------------------ No components found with this basename: POCBNP:3

## 2011-10-28 LAB — CBC
Hemoglobin: 11.9 g/dL — ABNORMAL LOW (ref 13.0–17.0)
MCH: 29.7 pg (ref 26.0–34.0)
MCHC: 34 g/dL (ref 30.0–36.0)
Platelets: 195 10*3/uL (ref 150–400)
RDW: 13.4 % (ref 11.5–15.5)

## 2011-10-28 LAB — COMPREHENSIVE METABOLIC PANEL
ALT: 13 U/L (ref 0–53)
Albumin: 2.3 g/dL — ABNORMAL LOW (ref 3.5–5.2)
Calcium: 8.6 mg/dL (ref 8.4–10.5)
GFR calc Af Amer: 86 mL/min — ABNORMAL LOW (ref >90)
Glucose, Bld: 102 mg/dL — ABNORMAL HIGH (ref 70–99)
Potassium: 3.3 mEq/L — ABNORMAL LOW (ref 3.5–5.1)
Sodium: 136 mEq/L (ref 135–145)
Total Protein: 6.2 g/dL (ref 6.0–8.3)

## 2011-10-28 LAB — GLUCOSE, CAPILLARY
Glucose-Capillary: 108 mg/dL — ABNORMAL HIGH (ref 70–99)
Glucose-Capillary: 137 mg/dL — ABNORMAL HIGH (ref 70–99)
Glucose-Capillary: 99 mg/dL (ref 70–99)

## 2011-10-28 MED ORDER — INSULIN ASPART 100 UNIT/ML ~~LOC~~ SOLN
0.0000 [IU] | Freq: Three times a day (TID) | SUBCUTANEOUS | Status: DC
  Administered 2011-10-29 – 2011-10-30 (×3): 1 [IU] via SUBCUTANEOUS

## 2011-10-28 MED ORDER — TAMSULOSIN HCL 0.4 MG PO CAPS
0.4000 mg | ORAL_CAPSULE | Freq: Every evening | ORAL | Status: DC
  Administered 2011-10-28 – 2011-10-29 (×2): 0.4 mg via ORAL
  Filled 2011-10-28 (×3): qty 1

## 2011-10-28 MED ORDER — WARFARIN VIDEO: Freq: Once | Status: DC

## 2011-10-28 MED ORDER — ASPIRIN EC 81 MG PO TBEC
81.0000 mg | DELAYED_RELEASE_TABLET | Freq: Every day | ORAL | Status: DC
  Administered 2011-10-28 – 2011-10-30 (×3): 81 mg via ORAL
  Filled 2011-10-28 (×3): qty 1

## 2011-10-28 NOTE — Progress Notes (Signed)
I have seen and examined the patient and agree with the assessment and plans.  Darby Shadwick A. Audryna Wendt  MD, FACS  

## 2011-10-28 NOTE — Progress Notes (Signed)
Physical Therapy Treatment Patient Details Name: ISAIYAH BALASH MRN: [id number] DOB: [birth year] Today's Date: 10/28/2011 Time: 6962-9528 PT Time Calculation (min): 21 min  PT Assessment / Plan / Recommendation Comments on Treatment Session  NG tube D/C, pt happy.  Sitting in recliner eating his liquid breakfast.  Pt states, he feels better.  Amb full unit with RW however pt won't need one for home.  very supportive spouse.    Follow Up Recommendations  No PT follow up    Barriers to Discharge        Equipment Recommendations  None recommended by PT    Recommendations for Other Services    Frequency Min 3X/week   Plan Discharge plan remains appropriate    Precautions / Restrictions Precautions Precautions: None Restrictions Weight Bearing Restrictions: No    Pertinent Vitals/Pain No c/o    Mobility  Bed Mobility Bed Mobility: Not assessed Details for Bed Mobility Assistance: Pt OOB in recliner  Transfers Transfers: Sit to Stand;Stand to Sit Sit to Stand: 5: Supervision;From chair/3-in-1 Stand to Sit: 5: Supervision;To chair/3-in-1 Details for Transfer Assistance: increased time due to IV/VAC tubing  Ambulation/Gait Ambulation/Gait Assistance: 4: Min assist Ambulation Distance (Feet): 450 Feet Assistive device: Rolling walker Ambulation/Gait Assistance Details: Pt tolerated amb full unit using a RW for safety, pt won't need one once he is home. Gait Pattern: Step-through pattern Gait velocity: decreased     PT Goals                  progressing    Visit Information  Last PT Received On: 10/28/11 Assistance Needed: +1                   End of Session PT - End of Session Equipment Utilized During Treatment: Gait belt Activity Tolerance: Patient tolerated treatment well Patient left: in chair;with call bell/phone within reach   Felecia Shelling  PTA [facility]  Acute  Rehab Pager     [phone]

## 2011-10-28 NOTE — Progress Notes (Signed)
Talked to patient with spouse present about DCP; patient lives with spouse, independent prior to admission- needs HHC RN at discharge - wound care; HHC choices given, patient and spouse chose [facility]/ Norberta Keens RN with [facility] called for arrangements; Barnetta Chapel PA called to see if patient will be discharged home with a wound vac; Ms Earl Gala is to examine the wound today and will decide if the patient will dc home with wound vac; B Ave Filter RN,BSN,MHA

## 2011-10-28 NOTE — Progress Notes (Signed)
[facility]                                                                                Patient Demographics  Michael Mejia, is a 71 y.o. male  JWJ:[id number]  AOZ:[id number]  DOB - Oct 05, 1940  Admit date - 10/23/2011  Admitting Physician Eduard Clos, MD  Outpatient Primary MD for the patient is No primary provider on file.  LOS - 5   Chief Complaint  Patient presents with  . 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 His other medical issues are atrial fibrillation and dyslipidemia he should appear stable.     1. 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 Daphine Deutscher general surgery - plan is to continue empiric IV Zosyn, continued n.p.o., NG tube for decompression, general surgery following closely. Patient passed flatus NG tube clamped 9:19 PM  will defer to surgeon as to when to graduate diet-potentially tomorrow morning with clear his Patient currently on 14 days of Cipro Flagyl-Zosyn discontinued 10/27/2011 a.m.   2. Incidental finding of non-obstructing renal stone and questionable lesion on the kidney on CT scan- ultrasound suggestive of complex cyst, will need outpatient urology follow up in 3-4 weeks.   3. History of atrial fibrillation - patient on Lopressor and aspirin at home, currently unable to take due to n.p.o. status for #1 above, when necessary IV Lopressor ordered, if it becomes an  issue we'll try IV digoxin or IV drip beta blocker if needed-currently stable and rate controlled A. fib on monitors PAtient to be restarted on PO ASAtoday 9/20 per Gen surg--confirmed DOESN'T TAKE COUMADIN    4. History of dyslipidemia - resume home dose Zocor once taking by mouth.   5. Hypokalemia-replacing by IV, K=3.3.  Check Mag and Bmet am   Code Status: Full  Family Communication: With the patient bedside on 10/24/2011  Disposition Plan: ? Home when taking full PO    Procedures   1. On 10/23/2011 by Dr. Daphine Deutscher general surgery patient had Exploratory laparotomy with resection of perforated small bowel diverticula with primary anastomosis.  2. CT abdomen and pelvis  3. Renal ultrasound   Prognosis good   Consults  Gen. surgery   Time Spent in minutes   15   Antibiotics    Anti-infectives     Start     Dose/Rate Route Frequency Ordered Stop   10/27/11 1200   metroNIDAZOLE (FLAGYL) IVPB 500 mg        500 mg 100 mL/hr over 60 Minutes Intravenous Every 8 hours 10/27/11 0840     10/27/11 1000   ciprofloxacin (CIPRO) IVPB 400 mg  400 mg 200 mL/hr over 60 Minutes Intravenous Every 12 hours 10/27/11 0840     10/24/11 0000   piperacillin-tazobactam (ZOSYN) IVPB 3.375 g  Status:  Discontinued        3.375 g 12.5 mL/hr over 240 Minutes Intravenous Every 8 hours 10/23/11 2356 10/27/11 0840   10/23/11 2130   ciprofloxacin (CIPRO) IVPB 400 mg  Status:  Discontinued        400 mg 200 mL/hr over 60 Minutes Intravenous  Once 10/23/11 2121 10/24/11 0254   10/23/11 2130   metroNIDAZOLE (FLAGYL) IVPB 500 mg        500 mg 100 mL/hr over 60 Minutes Intravenous  Once 10/23/11 2121 10/23/11 2316          Scheduled Meds:    . ciprofloxacin  400 mg Intravenous Q12H  . heparin  5,000 Units Subcutaneous Q8H  . metronidazole  500 mg Intravenous Q8H  . pantoprazole (PROTONIX) IV  40 mg Intravenous Q24H  . potassium chloride  10 mEq Intravenous Q1 Hr x 4  . sodium  chloride  3 mL Intravenous Q12H   Continuous Infusions:   PRN Meds:.acetaminophen, metoprolol, morphine injection   DVT Prophylaxis   Heparin     Rhetta Mura M.D on 10/28/2011 at 4:27 PM  Between 7am to 7pm - Pager - [phone]  After 7pm go to [email or web address] - password [email or web address]  And look for the night coverage person covering for me after hours  [facility] Office  [phone]    Subjective:   [name] today is doing well with some mild abdominal pain probably 5 on 10 more with movement.NGT and Wound vac removed.  Large stool this am 9/20 Physical therapy signed off 10/27/2011 as patient is doing so well.  Objective:   Filed Vitals:   10/27/11 2123 10/28/11 0500 10/28/11 0558 10/28/11 1309  BP: 128/75  130/86 125/79  Pulse: 85  98 85  Temp: 98.1 F (36.7 C)  98.1 F (36.7 C) 97.8 F (36.6 C)  TempSrc: Oral  Oral Oral  Resp: 16  18 16   Height:      Weight:  84.142 kg (185 lb 8 oz)    SpO2: 96%  95% 94%    Wt Readings from Last 3 Encounters:  10/28/11 84.142 kg (185 lb 8 oz)  10/28/11 84.142 kg (185 lb 8 oz)     Intake/Output Summary (Last 24 hours) at 10/28/11 1627 Last data filed at 10/28/11 1413  Gross per 24 hour  Intake    360 ml  Output   2376 ml  Net  -2016 ml    Exam Awake Alert, Oriented X 3, No new F.N deficits, Normal affect .AT,PERRAL Supple Neck,No JVD, No cervical lymphadenopathy appriciated.  Symmetrical Chest wall movement, Good air movement bilaterally, CTAB RRR,No Gallops,Rubs or new Murmurs, No Parasternal Heave No  B.Sounds, Abd Soft, non tender-Wet-dry on abd, No organomegaly appriciated, No rebound or rigidity. No Cyanosis, Clubbing or edema, No new Rash or bruise     Data Review   Micro Results Recent Results (from the past 240 hour(s))  CULTURE, BLOOD (ROUTINE X 2)     Status: Normal (Preliminary result)   Collection Time   10/23/11 10:00 PM      Component Value Range Status Comment   Specimen  Description BLOOD RIGHT ANTECUBITAL   Final    Special Requests BOTTLES DRAWN AEROBIC AND ANAEROBIC 5 CC EA   Final    Culture  Setup Time 10/24/2011 08:23  Final    Culture     Final    Value:        BLOOD CULTURE RECEIVED NO GROWTH TO DATE CULTURE WILL BE HELD FOR 5 DAYS BEFORE ISSUING A FINAL NEGATIVE REPORT   Report Status PENDING   Incomplete   CULTURE, BLOOD (ROUTINE X 2)     Status: Normal (Preliminary result)   Collection Time   10/23/11 10:10 PM      Component Value Range Status Comment   Specimen Description BLOOD RIGHT ARM   Final    Special Requests BOTTLES DRAWN AEROBIC ONLY 3 CC   Final    Culture  Setup Time 10/24/2011 08:23   Final    Culture     Final    Value:        BLOOD CULTURE RECEIVED NO GROWTH TO DATE CULTURE WILL BE HELD FOR 5 DAYS BEFORE ISSUING A FINAL NEGATIVE REPORT   Report Status PENDING   Incomplete   WOUND CULTURE     Status: Normal   Collection Time   10/24/11  3:39 AM      Component Value Range Status Comment   Specimen Description ABDOMEN   Final    Special Requests NONE   Final    Gram Stain     Final    Value: MODERATE WBC PRESENT, PREDOMINANTLY PMN     NO SQUAMOUS EPITHELIAL CELLS SEEN     NO ORGANISMS SEEN   Culture FEW ESCHERICHIA COLI   Final    Report Status 10/27/2011 FINAL   Final    Organism ID, Bacteria ESCHERICHIA COLI   Final   ANAEROBIC CULTURE     Status: Normal (Preliminary result)   Collection Time   10/24/11  3:44 AM      Component Value Range Status Comment   Specimen Description ABDOMEN   Final    Special Requests NONE   Final    Gram Stain     Final    Value: MODERATE WBC PRESENT, PREDOMINANTLY PMN     NO SQUAMOUS EPITHELIAL CELLS SEEN     NO ORGANISMS SEEN   Culture     Final    Value: NO ANAEROBES ISOLATED; CULTURE IN PROGRESS FOR 5 DAYS   Report Status PENDING   Incomplete     Radiology Reports Ct Abdomen Pelvis Wo Contrast  10/23/2011  *RADIOLOGY REPORT*  Clinical Data: Right lower quadrant pain.  CT ABDOMEN AND  PELVIS WITHOUT CONTRAST  Technique:  Multidetector CT imaging of the abdomen and pelvis was performed following the standard protocol without intravenous contrast.  Comparison: Chest and two views abdomen 10/23/2011 at 1804 hours.  Findings: Lung bases demonstrate some dependent atelectatic change. No pleural or pericardial effusion.  There is cardiomegaly.  Oral contrast material in the distal esophagus could be due to poor motility and/or reflux.  There is wall thickening and inflammatory change about a loop of jejunum in the midline immediately anterior to the right common iliac artery.  There are a tiny locules of free air adjacent to this abnormal loop of small bowel.  There is some free fluid present.  The no pneumatosis or portal venous gas is identified. No focal fluid collection is identified.  There appear be some tiny gravel type stones layering dependently within the gallbladder.  The gallbladder is otherwise unremarkable. The liver, spleen, adrenal glands and pancreas appear normal.  A punctate nonobstructing stone is identified in the lower pole of the right kidney.  There is an  exophytic lesion off the midpole of the left kidney measuring 1.9 cm with Hounsfield unit measurements of 22.2 which cannot be definitively characterized.  An additional smaller intraparenchymal low attenuating lesion is seen in the left kidney which also cannot be characterized.  The colon and appendix appear normal.  IMPRESSION:  1.  Free air within the abdomen with fluid present compatible with bowel perforation.  Free air is centered about a loop of jejunum which has thickened walls.  Differential considerations include inflammatory change and ischemia. 2.  Indeterminate lesion lower pole left kidney.  Non emergent renal ultrasound recommended for further characterization. 3.  Punctate nonobstructing stone lower pole right kidney. 4.  Likely gravel type gallstones without evidence of cholecystitis.  Critical Value/emergent  results were called by telephone at the time of interpretation on 10/23/2011 at 9:20 p.m. to Dr. Rhunette Croft, who verbally acknowledged these results.   Original Report Authenticated By: Bernadene Bell. D'ALESSIO, M.D.    Dg Abd Acute W/chest  10/23/2011  *RADIOLOGY REPORT*  Clinical Data: Fever.  Abdominal pain radiating towards the groin.  ACUTE ABDOMEN SERIES (ABDOMEN 2 VIEW & CHEST 1 VIEW)  Comparison: 04/11/2003  Findings: On the chest radiograph, right retro cardiac density favors hiatal hernia.  Tortuous thoracic aorta noted.  There is suspected subsegmental atelectasis at the left lung base.  Faint linear edge noted along the right hemidiaphragm.  Although the appearance is not classic for free intraperitoneal gas, intraperitoneal gas becomes difficult to completely exclude given this appearance.  Accordingly, I recommend either a left side down lateral decubitus view the abdomen or abdomen CT for further characterization.  Gas and stool noted in the colon several air-fluid levels are present in nondilated central abdominal small bowel, nonspecific.  IMPRESSION:  1.  Several nonspecific air-fluid levels and central abdominal loops of nondilated small bowel, query ileus. 2. Faint linear structure extending along the right hemidiaphragm is probably due to clothing and seems too thin to represent the right hemidiaphragm in the setting of free intraperitoneal gas. However, I do recommend a left side down lateral decubitus view of the abdomen for further confirmation that there is no free intraperitoneal gas.  Alternatively, abdomen CT could be utilized. 3.  Retrocardiac density favors hiatal hernia. 4.  Linear subsegmental atelectasis in the left lower lobe.   Original Report Authenticated By: Dellia Cloud, M.D.     CBC  Lab 10/28/11 0330 10/27/11 0407 10/26/11 0429 10/25/11 0425 10/24/11 0451 10/23/11 1755  WBC 5.2 6.3 9.0 7.8 6.6 --  HGB 11.9* 11.2* 12.6* 12.8* 12.9* --  HCT 35.0* 33.6* 38.8* 38.5*  38.5* --  PLT 195 161 162 155 141* --  MCV 87.3 88.2 89.4 89.5 87.3 --  MCH 29.7 29.4 29.0 29.8 29.3 --  MCHC 34.0 33.3 32.5 33.2 33.5 --  RDW 13.4 13.4 13.7 14.1 14.0 --  LYMPHSABS -- -- -- -- 0.7 0.7  MONOABS -- -- -- -- 0.5 0.6  EOSABS -- -- -- -- 0.0 0.0  BASOSABS -- -- -- -- 0.0 0.0  BANDABS -- -- -- -- -- --    Chemistries   Lab 10/28/11 0330 10/27/11 0407 10/26/11 0429 10/25/11 0425 10/24/11 0451  NA 136 134* 135 134* 136  K 3.3* 3.3* 3.6 4.0 3.9  CL 102 100 99 100 102  CO2 25 27 29 26 24   GLUCOSE 102* 125* 134* 114* 146*  BUN 14 15 17 23 20   CREATININE 1.00 1.05 1.12 1.19 1.01  CALCIUM 8.6 8.2* 8.6 8.4  7.8*  MG 1.9 1.9 1.9 1.8 --  AST 23 11 13 17 14   ALT 13 7 8 10 8   ALKPHOS 57 50 69 63 44  BILITOT 0.6 0.9 1.3* 1.4* 1.6*   ------------------------------------------------------------------------------------------------------------------ estimated creatinine clearance is 68.7 ml/min (by C-G formula based on Cr of 1). ------------------------------------------------------------------------------------------------------------------  [facility] 10/26/11 0429  HGBA1C 6.2*   ------------------------------------------------------------------------------------------------------------------ No results found for this basename: CHOL:2,HDL:2,LDLCALC:2,TRIG:2,CHOLHDL:2,LDLDIRECT:2 in the last 72 hours ------------------------------------------------------------------------------------------------------------------ No results found for this basename: TSH,T4TOTAL,FREET3,T3FREE,THYROIDAB in the last 72 hours ------------------------------------------------------------------------------------------------------------------ No results found for this basename: VITAMINB12:2,FOLATE:2,FERRITIN:2,TIBC:2,IRON:2,RETICCTPCT:2 in the last 72 hours  Coagulation profile  Lab 10/23/11 1755  INR 1.05  PROTIME --    No results found for this basename: DDIMER:2 in the last 72 hours  Cardiac  Enzymes  Lab 10/23/11 1755  CKMB --  TROPONINI <0.30  MYOGLOBIN --   ------------------------------------------------------------------------------------------------------------------ No components found with this basename: POCBNP:3

## 2011-10-28 NOTE — Progress Notes (Addendum)
Patient ID: Michael Mejia, male   DOB: October 31, 1940, 71 y.o.   MRN: [id number] 4 Days Post-Op  Subjective: Pt without nausea with tube clamped.  Continues to pass flatus.  Objective: Vital signs in last 24 hours: Temp:  [97.9 F (36.6 C)-98.1 F (36.7 C)] 98.1 F (36.7 C) (09/20 0558) Pulse Rate:  [83-98] 98  (09/20 0558) Resp:  [16-18] 18  (09/20 0558) BP: (124-130)/(75-86) 130/86 mmHg (09/20 0558) SpO2:  [94 %-96 %] 95 % (09/20 0558) Weight:  [185 lb 8 oz (84.142 kg)] 185 lb 8 oz (84.142 kg) (09/20 0500) Last BM Date: 10/23/11  Intake/Output from previous day: 09/19 0701 - 09/20 0700 In: 660 [NG/GT:60; IV Piggyback:600] Out: 2750 [Urine:2750] Intake/Output this shift:    PE: Abd: soft, still distended some, +BS, appropriately tender, wound VAC in place (will come eval when change done)  Lab Results:   Basename 10/28/11 0330 10/27/11 0407  WBC 5.2 6.3  HGB 11.9* 11.2*  HCT 35.0* 33.6*  PLT 195 161   BMET  Basename 10/28/11 0330 10/27/11 0407  NA 136 134*  K 3.3* 3.3*  CL 102 100  CO2 25 27  GLUCOSE 102* 125*  BUN 14 15  CREATININE 1.00 1.05  CALCIUM 8.6 8.2*   PT/INR No results found for this basename: LABPROT:2,INR:2 in the last 72 hours CMP     Component Value Date/Time   NA 136 10/28/2011 0330   K 3.3* 10/28/2011 0330   CL 102 10/28/2011 0330   CO2 25 10/28/2011 0330   GLUCOSE 102* 10/28/2011 0330   BUN 14 10/28/2011 0330   CREATININE 1.00 10/28/2011 0330   CALCIUM 8.6 10/28/2011 0330   PROT 6.2 10/28/2011 0330   ALBUMIN 2.3* 10/28/2011 0330   AST 23 10/28/2011 0330   ALT 13 10/28/2011 0330   ALKPHOS 57 10/28/2011 0330   BILITOT 0.6 10/28/2011 0330   GFRNONAA 74* 10/28/2011 0330   GFRAA 86* 10/28/2011 0330   Lipase     Component Value Date/Time   LIPASE 41 10/23/2011 1755       Studies/Results: No results found.  Anti-infectives: Anti-infectives     Start     Dose/Rate Route Frequency Ordered Stop   10/27/11 1200   metroNIDAZOLE (FLAGYL) IVPB 500 mg         500 mg 100 mL/hr over 60 Minutes Intravenous Every 8 hours 10/27/11 0840     10/27/11 1000   ciprofloxacin (CIPRO) IVPB 400 mg        400 mg 200 mL/hr over 60 Minutes Intravenous Every 12 hours 10/27/11 0840     10/24/11 0000   piperacillin-tazobactam (ZOSYN) IVPB 3.375 g  Status:  Discontinued        3.375 g 12.5 mL/hr over 240 Minutes Intravenous Every 8 hours 10/23/11 2356 10/27/11 0840   10/23/11 2130   ciprofloxacin (CIPRO) IVPB 400 mg  Status:  Discontinued        400 mg 200 mL/hr over 60 Minutes Intravenous  Once 10/23/11 2121 10/24/11 0254   10/23/11 2130   metroNIDAZOLE (FLAGYL) IVPB 500 mg        500 mg 100 mL/hr over 60 Minutes Intravenous  Once 10/23/11 2121 10/23/11 2316           Assessment/Plan  1. S/p ex lap with SBR 2. Post op ileus resolving  Plan: 1. Dc NGT and allow clear liquids today.   2. Should be ok to resume anticoagulation from surgery standpoint.   LOS: 5 days  Amiliana Foutz E 10/28/2011, 8:24 AM Pager: [phone]  ADDENDUM: Wound is clean and shallow.  He has good granulation tissue starting to form.  We will dc the VAC currently at the wound is quite small.  We will start NS WD dressing changes BID.  He may not even need HH as his wife is going to be taught how to do his dressing changes.  If she changes her mind and would like for them to come out once or twice she will let us know.  Michael Mejia E 11:31 AM 10/28/2011

## 2011-10-29 LAB — COMPREHENSIVE METABOLIC PANEL
ALT: 26 U/L (ref 0–53)
AST: 49 U/L — ABNORMAL HIGH (ref 0–37)
Albumin: 2.2 g/dL — ABNORMAL LOW (ref 3.5–5.2)
Alkaline Phosphatase: 54 U/L (ref 39–117)
Calcium: 8.4 mg/dL (ref 8.4–10.5)
GFR calc Af Amer: 77 mL/min — ABNORMAL LOW (ref >90)
Glucose, Bld: 93 mg/dL (ref 70–99)
Potassium: 3.7 mEq/L (ref 3.5–5.1)
Sodium: 135 mEq/L (ref 135–145)
Total Protein: 5.9 g/dL — ABNORMAL LOW (ref 6.0–8.3)

## 2011-10-29 LAB — GLUCOSE, CAPILLARY
Glucose-Capillary: 128 mg/dL — ABNORMAL HIGH (ref 70–99)
Glucose-Capillary: 146 mg/dL — ABNORMAL HIGH (ref 70–99)

## 2011-10-29 LAB — ANAEROBIC CULTURE

## 2011-10-29 LAB — CBC
Hemoglobin: 12.1 g/dL — ABNORMAL LOW (ref 13.0–17.0)
MCH: 29.4 pg (ref 26.0–34.0)
MCHC: 34 g/dL (ref 30.0–36.0)
Platelets: 203 10*3/uL (ref 150–400)
RDW: 13.3 % (ref 11.5–15.5)

## 2011-10-29 MED ORDER — METRONIDAZOLE 500 MG PO TABS
500.0000 mg | ORAL_TABLET | Freq: Three times a day (TID) | ORAL | Status: DC
  Administered 2011-10-29 – 2011-10-30 (×2): 500 mg via ORAL
  Filled 2011-10-29 (×5): qty 1

## 2011-10-29 MED ORDER — CIPROFLOXACIN HCL 500 MG PO TABS
500.0000 mg | ORAL_TABLET | Freq: Two times a day (BID) | ORAL | Status: DC
  Administered 2011-10-29 – 2011-10-30 (×2): 500 mg via ORAL
  Filled 2011-10-29 (×4): qty 1

## 2011-10-29 NOTE — Progress Notes (Signed)
Patient ID: Michael Mejia, male   DOB: 31-Aug-1940, 71 y.o.   MRN: [id number] 5 Days Post-Op  Subjective: No complaints this morning. Tolerating full liquids without nausea. He has had several bowel movements.  Objective: Vital signs in last 24 hours: Temp:  [97.7 F (36.5 C)-98.3 F (36.8 C)] 97.7 F (36.5 C) (09/21 0447) Pulse Rate:  [78-85] 78  (09/21 0447) Resp:  [16-20] 20  (09/21 0447) BP: (125-135)/(76-79) 131/76 mmHg (09/21 0447) SpO2:  [94 %-98 %] 98 % (09/21 0447) Last BM Date: 10/27/11  Intake/Output from previous day: 09/20 0701 - 09/21 0700 In: 720 [P.O.:720] Out: 1786 [Urine:1785; Stool:1] Intake/Output this shift: Total I/O In: 360 [P.O.:360] Out: -   General appearance: alert, cooperative and no distress GI: normal findings: soft, non-tender and and minimally if at all distended Incision/Wound: just repacked and reported very clean  Lab Results:   Basename 10/29/11 0518 10/28/11 0330  WBC 4.6 5.2  HGB 12.1* 11.9*  HCT 35.6* 35.0*  PLT 203 195   BMET  Basename 10/29/11 0518 10/28/11 0330  NA 135 136  K 3.7 3.3*  CL 101 102  CO2 24 25  GLUCOSE 93 102*  BUN 16 14  CREATININE 1.09 1.00  CALCIUM 8.4 8.6     Studies/Results: No results found.  Anti-infectives: Anti-infectives     Start     Dose/Rate Route Frequency Ordered Stop   10/27/11 1200   metroNIDAZOLE (FLAGYL) IVPB 500 mg        500 mg 100 mL/hr over 60 Minutes Intravenous Every 8 hours 10/27/11 0840     10/27/11 1000   ciprofloxacin (CIPRO) IVPB 400 mg        400 mg 200 mL/hr over 60 Minutes Intravenous Every 12 hours 10/27/11 0840     10/24/11 0000   piperacillin-tazobactam (ZOSYN) IVPB 3.375 g  Status:  Discontinued        3.375 g 12.5 mL/hr over 240 Minutes Intravenous Every 8 hours 10/23/11 2356 10/27/11 0840   10/23/11 2130   ciprofloxacin (CIPRO) IVPB 400 mg  Status:  Discontinued        400 mg 200 mL/hr over 60 Minutes Intravenous  Once 10/23/11 2121 10/24/11 0254   10/23/11 2130   metroNIDAZOLE (FLAGYL) IVPB 500 mg        500 mg 100 mL/hr over 60 Minutes Intravenous  Once 10/23/11 2121 10/23/11 2316          Assessment/Plan: s/p Procedure(s): EXPLORATORY LAPAROTOMY SMALL BOWEL RESECTION Doing well with return of bowel function and no evidence of complications. Advance diet today. Expect he could be discharged soon.   LOS: 6 days    Ovidio Steele T 10/29/2011

## 2011-10-29 NOTE — Progress Notes (Signed)
Pt's wet to dry dressing explained to Pt and his wife. Return demonstration done by wife.Pt premedicated and tol well. Teach back complete. Will continue to monitor.

## 2011-10-29 NOTE — Progress Notes (Signed)
[facility]                                                                                Patient Demographics  Michael Mejia, is a 71 y.o. male  ZOX:[id number]  [id number]  DOB - [birth year]  Admit date - 10/23/2011  Admitting Physician Eduard Clos, MD  Outpatient Primary MD for the patient is No primary provider on file.  LOS - 6   Chief Complaint  Patient presents with  . 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 His other medical issues are atrial fibrillation and dyslipidemia he should appear stable.     1. 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 Daphine Deutscher general surgery - plan is to continue empiric IV Zosyn, continued n.p.o., NG tube for decompression, general surgery following closely. Patient passed flatus NG tube clamped 9:19 PM  will defer to surgeon as to when to graduate diet-potentially tomorrow morning with clear his Patient currently on 14 days of Cipro Flagyl-Zosyn discontinued 10/27/2011 a.m. Surgeon-please address--HOW long does he need PO FLagyl /Cipro?  14 days total or longer?   2. Incidental finding of non-obstructing renal stone and questionable lesion on the kidney on CT scan- ultrasound suggestive of complex cyst, will need outpatient urology follow up in 3-4 weeks.   3. History of atrial fibrillation - patient on Lopressor and aspirin at home, PAtient to be restarted  on PO ASA today 9/20 per Gen surg--confirmed DOESN'T TAKE COUMADIN    4. History of dyslipidemia - resume home dose Zocor once taking by mouth.  5. Hypokalemia-replacing by IV, K=3.3.  Check Mag and Bmet am  6. Nutrition-Add supplmements  7. Borderline DM-Blood sugars about 130-150-Get A1c as an outpatient.  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 Home when taking full PO    Procedures   1. On 10/23/2011 by Dr. Daphine Deutscher general surgery patient had Exploratory laparotomy with resection of perforated small bowel diverticula with primary anastomosis.  2. CT abdomen and pelvis  3. Renal ultrasound   Prognosis good   Consults  Gen. surgery   Time Spent in minutes   15   Antibiotics    Anti-infectives     Start     Dose/Rate Route Frequency Ordered Stop   10/29/11 2000   ciprofloxacin (CIPRO) tablet 500 mg        500 mg Oral 2 times daily 10/29/11 1428     10/29/11 1430   metroNIDAZOLE (FLAGYL) tablet 500 mg        500 mg Oral 3 times  per day 10/29/11 1428     10/27/11 1200   metroNIDAZOLE (FLAGYL) IVPB 500 mg  Status:  Discontinued        500 mg 100 mL/hr over 60 Minutes Intravenous Every 8 hours 10/27/11 0840 10/29/11 1428   10/27/11 1000   ciprofloxacin (CIPRO) IVPB 400 mg  Status:  Discontinued        400 mg 200 mL/hr over 60 Minutes Intravenous Every 12 hours 10/27/11 0840 10/29/11 1428   10/24/11 0000   piperacillin-tazobactam (ZOSYN) IVPB 3.375 g  Status:  Discontinued        3.375 g 12.5 mL/hr over 240 Minutes Intravenous Every 8 hours 10/23/11 2356 10/27/11 0840   10/23/11 2130   ciprofloxacin (CIPRO) IVPB 400 mg  Status:  Discontinued        400 mg 200 mL/hr over 60 Minutes Intravenous  Once 10/23/11 2121 10/24/11 0254   10/23/11 2130   metroNIDAZOLE (FLAGYL) IVPB 500 mg        500 mg 100 mL/hr over 60 Minutes Intravenous  Once 10/23/11 2121 10/23/11  2316          Scheduled Meds:    . aspirin EC  81 mg Oral Daily  . ciprofloxacin  400 mg Intravenous Q12H  . heparin  5,000 Units Subcutaneous Q8H  . insulin aspart  0-9 Units Subcutaneous TID WC  . metronidazole  500 mg Intravenous Q8H  . pantoprazole (PROTONIX) IV  40 mg Intravenous Q24H  . sodium chloride  3 mL Intravenous Q12H  . Tamsulosin HCl  0.4 mg Oral QPM  . DISCONTD: warfarin   Does not apply Once   Continuous Infusions:   PRN Meds:.acetaminophen, morphine injection, DISCONTD: metoprolol   DVT Prophylaxis   Heparin     Rhetta Mura M.D on 10/29/2011 at 2:19 PM  Between 7am to 7pm - Pager - [phone]  After 7pm go to [email or web address] - password [email or web address]  And look for the night coverage person covering for me after hours  [facility] Office  [phone]    Subjective:   Michael Mejia today is doing well with some mild abdominal pain. Tolerating full diet Ambulant Wound reviewed and looks good No CP/N/V/SOB Physical therapy signed off 10/27/2011 as patient is doing so well.  Objective:   Filed Vitals:   10/28/11 0558 10/28/11 1309 10/28/11 2104 10/29/11 0447  BP: 130/86 125/79 135/78 131/76  Pulse: 98 85 82 78  Temp: 98.1 F (36.7 C) 97.8 F (36.6 C) 98.3 F (36.8 C) 97.7 F (36.5 C)  TempSrc: Oral Oral Oral Oral  Resp: 18 16 16 20   Height:      Weight:      SpO2: 95% 94% 98% 98%    Wt Readings from Last 3 Encounters:  10/28/11 84.142 kg (185 lb 8 oz)  10/28/11 84.142 kg (185 lb 8 oz)     Intake/Output Summary (Last 24 hours) at 10/29/11 1419 Last data filed at 10/29/11 1355  Gross per 24 hour  Intake   1080 ml  Output   1160 ml  Net    -80 ml    Exam Awake Alert, Oriented X 3, No new F.N deficits, Normal affect [place].AT,PERRAL Supple Neck,No JVD, No cervical lymphadenopathy appriciated.  Symmetrical Chest wall movement, Good air movement bilaterally, CTAB RRR,No Gallops,Rubs or new Murmurs, No Parasternal  Heave No  B.Sounds, Abd Soft, non tender-Wet-dry on abd, No organomegaly appriciated, No rebound or rigidity-Mild wound dehiscence noted No Cyanosis, Clubbing or edema, No new Rash  or bruise     Data Review   Micro Results Recent Results (from the past 240 hour(s))  CULTURE, BLOOD (ROUTINE X 2)     Status: Normal (Preliminary result)   Collection Time   10/23/11 10:00 PM      Component Value Range Status Comment   Specimen Description BLOOD RIGHT ANTECUBITAL   Final    Special Requests BOTTLES DRAWN AEROBIC AND ANAEROBIC 5 CC EA   Final    Culture  Setup Time 10/24/2011 08:23   Final    Culture     Final    Value:        BLOOD CULTURE RECEIVED NO GROWTH TO DATE CULTURE WILL BE HELD FOR 5 DAYS BEFORE ISSUING A FINAL NEGATIVE REPORT   Report Status PENDING   Incomplete   CULTURE, BLOOD (ROUTINE X 2)     Status: Normal (Preliminary result)   Collection Time   10/23/11 10:10 PM      Component Value Range Status Comment   Specimen Description BLOOD RIGHT ARM   Final    Special Requests BOTTLES DRAWN AEROBIC ONLY 3 CC   Final    Culture  Setup Time 10/24/2011 08:23   Final    Culture     Final    Value:        BLOOD CULTURE RECEIVED NO GROWTH TO DATE CULTURE WILL BE HELD FOR 5 DAYS BEFORE ISSUING A FINAL NEGATIVE REPORT   Report Status PENDING   Incomplete   WOUND CULTURE     Status: Normal   Collection Time   10/24/11  3:39 AM      Component Value Range Status Comment   Specimen Description ABDOMEN   Final    Special Requests NONE   Final    Gram Stain     Final    Value: MODERATE WBC PRESENT, PREDOMINANTLY PMN     NO SQUAMOUS EPITHELIAL CELLS SEEN     NO ORGANISMS SEEN   Culture FEW ESCHERICHIA COLI   Final    Report Status 10/27/2011 FINAL   Final    Organism ID, Bacteria ESCHERICHIA COLI   Final   ANAEROBIC CULTURE     Status: Normal (Preliminary result)   Collection Time   10/24/11  3:44 AM      Component Value Range Status Comment   Specimen Description ABDOMEN   Final     Special Requests NONE   Final    Gram Stain     Final    Value: MODERATE WBC PRESENT, PREDOMINANTLY PMN     NO SQUAMOUS EPITHELIAL CELLS SEEN     NO ORGANISMS SEEN   Culture     Final    Value: NO ANAEROBES ISOLATED; CULTURE IN PROGRESS FOR 5 DAYS   Report Status PENDING   Incomplete     Radiology Reports Ct Abdomen Pelvis Wo Contrast  10/23/2011  *RADIOLOGY REPORT*  Clinical Data: Right lower quadrant pain.  CT ABDOMEN AND PELVIS WITHOUT CONTRAST  Technique:  Multidetector CT imaging of the abdomen and pelvis was performed following the standard protocol without intravenous contrast.  Comparison: Chest and two views abdomen 10/23/2011 at 1804 hours.  Findings: Lung bases demonstrate some dependent atelectatic change. No pleural or pericardial effusion.  There is cardiomegaly.  Oral contrast material in the distal esophagus could be due to poor motility and/or reflux.  There is wall thickening and inflammatory change about a loop of jejunum in the midline immediately anterior to the right common iliac  artery.  There are a tiny locules of free air adjacent to this abnormal loop of small bowel.  There is some free fluid present.  The no pneumatosis or portal venous gas is identified. No focal fluid collection is identified.  There appear be some tiny gravel type stones layering dependently within the gallbladder.  The gallbladder is otherwise unremarkable. The liver, spleen, adrenal glands and pancreas appear normal.  A punctate nonobstructing stone is identified in the lower pole of the right kidney.  There is an exophytic lesion off the midpole of the left kidney measuring 1.9 cm with Hounsfield unit measurements of 22.2 which cannot be definitively characterized.  An additional smaller intraparenchymal low attenuating lesion is seen in the left kidney which also cannot be characterized.  The colon and appendix appear normal.  IMPRESSION:  1.  Free air within the abdomen with fluid present compatible  with bowel perforation.  Free air is centered about a loop of jejunum which has thickened walls.  Differential considerations include inflammatory change and ischemia. 2.  Indeterminate lesion lower pole left kidney.  Non emergent renal ultrasound recommended for further characterization. 3.  Punctate nonobstructing stone lower pole right kidney. 4.  Likely gravel type gallstones without evidence of cholecystitis.  Critical Value/emergent results were called by telephone at the time of interpretation on 10/23/2011 at 9:20 p.m. to Dr. Rhunette Croft, who verbally acknowledged these results.   Original Report Authenticated By: Bernadene Bell. D'ALESSIO, M.D.    Dg Abd Acute W/chest  10/23/2011  *RADIOLOGY REPORT*  Clinical Data: Fever.  Abdominal pain radiating towards the groin.  ACUTE ABDOMEN SERIES (ABDOMEN 2 VIEW & CHEST 1 VIEW)  Comparison: 04/11/2003  Findings: On the chest radiograph, right retro cardiac density favors hiatal hernia.  Tortuous thoracic aorta noted.  There is suspected subsegmental atelectasis at the left lung base.  Faint linear edge noted along the right hemidiaphragm.  Although the appearance is not classic for free intraperitoneal gas, intraperitoneal gas becomes difficult to completely exclude given this appearance.  Accordingly, I recommend either a left side down lateral decubitus view the abdomen or abdomen CT for further characterization.  Gas and stool noted in the colon several air-fluid levels are present in nondilated central abdominal small bowel, nonspecific.  IMPRESSION:  1.  Several nonspecific air-fluid levels and central abdominal loops of nondilated small bowel, query ileus. 2. Faint linear structure extending along the right hemidiaphragm is probably due to clothing and seems too thin to represent the right hemidiaphragm in the setting of free intraperitoneal gas. However, I do recommend a left side down lateral decubitus view of the abdomen for further confirmation that there is no  free intraperitoneal gas.  Alternatively, abdomen CT could be utilized. 3.  Retrocardiac density favors hiatal hernia. 4.  Linear subsegmental atelectasis in the left lower lobe.   Original Report Authenticated By: Dellia Cloud, M.D.     CBC  Lab 10/29/11 0518 10/28/11 0330 10/27/11 0407 10/26/11 0429 10/25/11 0425 10/24/11 0451 10/23/11 1755  WBC 4.6 5.2 6.3 9.0 7.8 -- --  HGB 12.1* 11.9* 11.2* 12.6* 12.8* -- --  HCT 35.6* 35.0* 33.6* 38.8* 38.5* -- --  PLT 203 195 161 162 155 -- --  MCV 86.4 87.3 88.2 89.4 89.5 -- --  MCH 29.4 29.7 29.4 29.0 29.8 -- --  MCHC 34.0 34.0 33.3 32.5 33.2 -- --  RDW 13.3 13.4 13.4 13.7 14.1 -- --  LYMPHSABS -- -- -- -- -- 0.7 0.7  MONOABS -- -- -- -- --  0.5 0.6  EOSABS -- -- -- -- -- 0.0 0.0  BASOSABS -- -- -- -- -- 0.0 0.0  BANDABS -- -- -- -- -- -- --    Chemistries   Lab 10/29/11 0518 10/28/11 0330 10/27/11 0407 10/26/11 0429 10/25/11 0425  NA 135 136 134* 135 134*  K 3.7 3.3* 3.3* 3.6 4.0  CL 101 102 100 99 100  CO2 24 25 27 29 26   GLUCOSE 93 102* 125* 134* 114*  BUN 16 14 15 17 23   CREATININE 1.09 1.00 1.05 1.12 1.19  CALCIUM 8.4 8.6 8.2* 8.6 8.4  MG 1.9 1.9 1.9 1.9 1.8  AST 49* 23 11 13 17   ALT 26 13 7 8 10   ALKPHOS 54 57 50 69 63  BILITOT 0.5 0.6 0.9 1.3* 1.4*   ------------------------------------------------------------------------------------------------------------------ estimated creatinine clearance is 63.1 ml/min (by C-G formula based on Cr of 1.09). ------------------------------------------------------------------------------------------------------------------ No results found for this basename: HGBA1C:2 in the last 72 hours ------------------------------------------------------------------------------------------------------------------ No results found for this basename: CHOL:2,HDL:2,LDLCALC:2,TRIG:2,CHOLHDL:2,LDLDIRECT:2 in the last 72  hours ------------------------------------------------------------------------------------------------------------------ No results found for this basename: TSH,T4TOTAL,FREET3,T3FREE,THYROIDAB in the last 72 hours ------------------------------------------------------------------------------------------------------------------ No results found for this basename: VITAMINB12:2,FOLATE:2,FERRITIN:2,TIBC:2,IRON:2,RETICCTPCT:2 in the last 72 hours  Coagulation profile  Lab 10/23/11 1755  INR 1.05  PROTIME --    No results found for this basename: DDIMER:2 in the last 72 hours  Cardiac Enzymes  Lab 10/23/11 1755  CKMB --  TROPONINI <0.30  MYOGLOBIN --   ------------------------------------------------------------------------------------------------------------------ No components found with this basename: POCBNP:3

## 2011-10-30 LAB — CBC
HCT: 35.2 % — ABNORMAL LOW (ref 39.0–52.0)
MCHC: 34.7 g/dL (ref 30.0–36.0)
Platelets: 217 10*3/uL (ref 150–400)
RDW: 13.5 % (ref 11.5–15.5)
WBC: 4.7 10*3/uL (ref 4.0–10.5)

## 2011-10-30 LAB — CULTURE, BLOOD (ROUTINE X 2): Culture: NO GROWTH

## 2011-10-30 LAB — COMPREHENSIVE METABOLIC PANEL
AST: 61 U/L — ABNORMAL HIGH (ref 0–37)
Albumin: 2.3 g/dL — ABNORMAL LOW (ref 3.5–5.2)
BUN: 19 mg/dL (ref 6–23)
Creatinine, Ser: 1.1 mg/dL (ref 0.50–1.35)
Potassium: 3.3 mEq/L — ABNORMAL LOW (ref 3.5–5.1)
Total Protein: 5.9 g/dL — ABNORMAL LOW (ref 6.0–8.3)

## 2011-10-30 LAB — GLUCOSE, CAPILLARY
Glucose-Capillary: 149 mg/dL — ABNORMAL HIGH (ref 70–99)
Glucose-Capillary: 121 mg/dL — ABNORMAL HIGH (ref 70–99)

## 2011-10-30 LAB — MAGNESIUM: Magnesium: 1.9 mg/dL (ref 1.5–2.5)

## 2011-10-30 LAB — CALCIUM, IONIZED: Calcium, Ion: 1.12 mmol/L — ABNORMAL LOW (ref 1.12–1.32)

## 2011-10-30 MED ORDER — CARRASYN V WOUND DRESSING EX GEL: CUTANEOUS | Status: DC | PRN

## 2011-10-30 MED ORDER — PANTOPRAZOLE SODIUM 20 MG PO TBEC: 20.0000 mg | DELAYED_RELEASE_TABLET | Freq: Every day | ORAL | Status: DC

## 2011-10-30 MED ORDER — HYDROCODONE-ACETAMINOPHEN 5-500 MG PO TABS: 1.0000 | ORAL_TABLET | Freq: Four times a day (QID) | ORAL | Status: DC | PRN

## 2011-10-30 MED ORDER — METRONIDAZOLE 500 MG PO TABS: 500.0000 mg | ORAL_TABLET | Freq: Three times a day (TID) | ORAL | Status: DC

## 2011-10-30 MED ORDER — CIPROFLOXACIN HCL 500 MG PO TABS: 500.0000 mg | ORAL_TABLET | Freq: Two times a day (BID) | ORAL | Status: DC

## 2011-10-30 NOTE — Progress Notes (Signed)
Patient discharged home with wife. Discharge instructions given and explained to patient/family and they verbalized understanding.  Patient's wife already demonstrated wound care. Incision clean dry and intact; no sign of infection noted. F/U with home health RN and MD outpatient. No other wound, skin intact. Accompanied home by wife, transported to the car via wheelchair by staff.

## 2011-10-30 NOTE — Progress Notes (Signed)
Patient ID: Michael Mejia, male   DOB: 04/22/1940, 71 y.o.   MRN: [id number] 6 Days Post-Op  Subjective: No C/O this AM.  Tol reg diet well  Objective: Vital signs in last 24 hours: Temp:  [97.7 F (36.5 C)-100.5 F (38.1 C)] 98.2 F (36.8 C) (09/22 0601) Pulse Rate:  [81-94] 85  (09/22 0601) Resp:  [18-19] 19  (09/22 0601) BP: (129-171)/(67-90) 130/90 mmHg (09/22 0601) SpO2:  [95 %-97 %] 96 % (09/22 0601) Weight:  [183 lb 3.2 oz (83.099 kg)] 183 lb 3.2 oz (83.099 kg) (09/22 0601) Last BM Date: 10/29/11  Intake/Output from previous day: 09/21 0701 - 09/22 0700 In: 1210 [P.O.:960; IV Piggyback:250] Out: -  Intake/Output this shift: Total I/O In: 240 [P.O.:240] Out: -   General appearance: alert, cooperative and no distress GI: normal findings: soft, non-tender Incision/Wound: All clean granulation  Lab Results:   Basename 10/30/11 0415 10/29/11 0518  WBC 4.7 4.6  HGB 12.2* 12.1*  HCT 35.2* 35.6*  PLT 217 203   BMET  Basename 10/30/11 0415 10/29/11 0518  NA 137 135  K 3.3* 3.7  CL 103 101  CO2 23 24  GLUCOSE 111* 93  BUN 19 16  CREATININE 1.10 1.09  CALCIUM 8.2* 8.4     Studies/Results: No results found.  Anti-infectives: Anti-infectives     Start     Dose/Rate Route Frequency Ordered Stop   10/29/11 2200   metroNIDAZOLE (FLAGYL) tablet 500 mg        500 mg Oral 3 times per day 10/29/11 1428     10/29/11 2000   ciprofloxacin (CIPRO) tablet 500 mg        500 mg Oral 2 times daily 10/29/11 1428     10/27/11 1200   metroNIDAZOLE (FLAGYL) IVPB 500 mg  Status:  Discontinued        500 mg 100 mL/hr over 60 Minutes Intravenous Every 8 hours 10/27/11 0840 10/29/11 1428   10/27/11 1000   ciprofloxacin (CIPRO) IVPB 400 mg  Status:  Discontinued        400 mg 200 mL/hr over 60 Minutes Intravenous Every 12 hours 10/27/11 0840 10/29/11 1428   10/24/11 0000   piperacillin-tazobactam (ZOSYN) IVPB 3.375 g  Status:  Discontinued        3.375 g 12.5 mL/hr over 240  Minutes Intravenous Every 8 hours 10/23/11 2356 10/27/11 0840   10/23/11 2130   ciprofloxacin (CIPRO) IVPB 400 mg  Status:  Discontinued        400 mg 200 mL/hr over 60 Minutes Intravenous  Once 10/23/11 2121 10/24/11 0254   10/23/11 2130   metroNIDAZOLE (FLAGYL) IVPB 500 mg        500 mg 100 mL/hr over 60 Minutes Intravenous  Once 10/23/11 2121 10/23/11 2316          Assessment/Plan: s/p Procedure(s): EXPLORATORY LAPAROTOMY SMALL BOWEL RESECTION Doing well, OK for discharge Pt has been afebrile with nl WBC, would continue short course of oral abx at home, 5 more days Wife comfortable with dressing changes F/U Dr Daphine Deutscher in two weeks   LOS: 7 days    Egor Fullilove T 10/30/2011

## 2011-10-30 NOTE — Care Management (Signed)
Pt discharged. Previous dc plan confirmed with [facility] providing HH services upon discharge. Pt's spouse to provide tx home & assist with home care. Pt request 3n1. [facility] notified of DME referral. DME delivered to room prior to discharge. Md orders,demographics, H/P faxed to [facility] at [phone]. Confirmation received.   Leonie Green [phone]

## 2011-10-30 NOTE — Discharge Summary (Signed)
Physician Discharge Summary  Michael Mejia:[id number] DOB: 05/22/1940 DOA: 10/23/2011  PCP: No primary provider on file.  Admit date: 10/23/2011 Discharge date: 10/30/2011  Recommendations for Outpatient Follow-up:  1. Recommend HbA1c at office followup-his blood sugars in hospital have not been above 200 however 2. Needs followup for complex renal cyst noted on CT abdomen-he has a history of urological issues but has never been seen by any urologist in town 3. Recommend followup with Dr. Daphine Deutscher as an outpatient 4. Recommend CBC and basic metabolic panel primary care physician's office in about a week 5. Would have him followup with Dr. Donnie Aho in about a month   Discharge Diagnoses:  Principal Problem:  *Perforated viscus Active Problems:  Renal stones  Atrial fibrillation, not on coumadin  Elevated blood sugar   Discharge Condition: Good  Diet recommendation: No limitation at present  [facility] Weights   10/27/11 0556 10/28/11 0500 10/30/11 0601  Weight: 85.821 kg (189 lb 3.2 oz) 84.142 kg (185 lb 8 oz) 83.099 kg (183 lb 3.2 oz)    History of present illness:  71 year-old male with history of atrial fibrillation, hyperlipidemia has been experiencing abdominal pain since today morning 10:00. Patient's pain started as a scrotal pain which became more generalized with nausea but no vomiting or diarrhea. 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 Over the last 6 months patient has been having increasing bowel movements at least twice a day after food. Denies any chest pain or shortness of breath. In the ER patient had a CT abdomen pelvis which shows possibility of a perforated viscus and patient has been admitted for further management    Hospital Course:  1. 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 Daphine Deutscher general surgery - NG tube clamped 9:19 PM Tolerated full diet 9/21  D/c home on 5 days more of Abx (today 9/22 is D#8/14) 2. Incidental finding of non-obstructing renal stone and questionable lesion on the kidney on CT scan- ultrasound suggestive of complex cyst, will need outpatient urology follow up in 3-4 weeks vs Renal USg at that time to determine characterization of complex mass 3. History of atrial fibrillation, [place] Vasc score=potentuially 2 (imparied glucose tol means  patient on Lopressor and aspirin at home PO ASA today 9/20 per Gen surg--confirmed DOESN'T TAKE COUMADIN-discussion c Dr. Donnie Aho recommended as out-patient 4. History of dyslipidemia - resume home dose Zocor once taking by mouth-LDL Per PCP as outpatient  5. Hypokalemia-replacing by IV, K=3.3. Check Mag and Bmet am  6. Nutrition-Add supplmements-Tolerating full diet on d/c home 7. Borderline DM-Blood sugars about 130-150-Get A1c as an outpatient. 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 Acute abdominal series 10/23/11 = several nonspecific air-fluid levels in central abdominal nondilated small bowel,? Ileus-feeling a structure extending right hemidiaphragm-retrocardiac density favor spinal hernia-the new subsegmental atelectasis left lower lobe 2. CT abdomen pelvis 9/13 = free air with an abdomen with fluid present compatible bowel perforation, free air center about a loop of jejunum which has thickened walls-DDX = Inflammatory change/ ischemia-indeterminate lesion lower pole left kidney = nonemergent renal ultrasound recommended for further characterization, punctate nonobstructing stone right kidney, gravel type gallstones without cholecystitis 3. Renal ultrasound 10/24/2011 = 1.7x1.2 left renal lesion mostly keeping with mildly complex cyst-6-12 month followup to confirm stability  is reasonable 4. On 10/23/2011 by Dr. Daphine Deutscher  general surgery patient had Exploratory laparotomy with resection of perforated small bowel diverticula with primary anastomosis.   Consults Gen. surgery   Discharge Exam: Filed Vitals:   10/29/11 2050 10/29/11 2109 10/29/11 2114 10/30/11 0601  BP: 135/76 165/68 171/67 130/90  Pulse: 81 94  85  Temp: 98.4 F (36.9 C) 100.5 F (38.1 C)  98.2 F (36.8 C)  TempSrc: Oral Oral  Oral  Resp: 18 19  19   Height:      Weight:    83.099 kg (183 lb 3.2 oz)  SpO2: 95% 95%  96%    Awake Alert, Oriented X 3, No new F.N deficits, Normal affect  [place].AT,PERRAL  Supple Neck,No JVD, No cervical lymphadenopathy appriciated.  Symmetrical Chest wall movement, Good air movement bilaterally, CTAB  RRR,No Gallops,Rubs or new Murmurs, No Parasternal Heave  No B.Sounds, Abd Soft, non tender-Wet-dry on abd, No organomegaly appriciated, No rebound or rigidity-Mild wound dehiscence noted   Discharge Instructions  Discharge Orders    Future Orders Please Complete By Expires   Gauze packing strips 1/4"          Medication List     As of 10/30/2011 10:09 AM    STOP taking these medications         naproxen sodium 550 MG tablet   Commonly known as: ANAPROX      traMADol 50 MG tablet   Commonly known as: ULTRAM      TAKE these medications         aspirin EC 81 MG tablet   Take 81 mg by mouth daily.      ciprofloxacin 500 MG tablet   Commonly known as: CIPRO   Take 1 tablet (500 mg total) by mouth 2 (two) times daily.      cyclobenzaprine 10 MG tablet   Commonly known as: FLEXERIL   Take 10 mg by mouth as needed.      HYDROcodone-acetaminophen 5-500 MG per tablet   Commonly known as: VICODIN   Take 1 tablet by mouth every 6 (six) hours as needed for pain.      metoprolol 50 MG tablet   Commonly known as: LOPRESSOR   Take 25 mg by mouth 2 (two) times daily. Takes 1/2 tablet      metroNIDAZOLE 500 MG tablet   Commonly known as: FLAGYL   Take 1 tablet (500 mg total) by mouth every 8  (eight) hours.      pantoprazole 20 MG tablet   Commonly known as: PROTONIX   Take 1 tablet (20 mg total) by mouth daily.      simvastatin 40 MG tablet   Commonly known as: ZOCOR   Take 40 mg by mouth every evening.      Tamsulosin HCl 0.4 MG Caps   Commonly known as: FLOMAX   Take 0.4 mg by mouth every evening.      wound dressings gel   Apply topically as needed for wound care.       Follow-up Information    Follow up with Luretha Murphy B, MD. Schedule an appointment as soon as possible for a visit in 2 weeks.   Contact information:   [address] [place] [address] [phone]       Follow up with Lolita Patella, MD. Schedule an appointment as soon as possible for a visit in 1 week.   Contact information:   [facility] [phone]  [address] [place] [address] [phone]           The results of significant diagnostics from this hospitalization (including imaging, microbiology, ancillary and laboratory) are listed below for reference.    Significant Diagnostic Studies: Ct Abdomen Pelvis Wo Contrast  10/23/2011  *RADIOLOGY REPORT*  Clinical Data: Right lower quadrant pain.  CT ABDOMEN AND PELVIS WITHOUT CONTRAST  Technique:  Multidetector CT imaging of the abdomen and pelvis was performed following the standard protocol without intravenous contrast.  Comparison: Chest and two views abdomen 10/23/2011 at 1804 hours.  Findings: Lung bases demonstrate some dependent atelectatic change. No pleural or pericardial effusion.  There is cardiomegaly.  Oral contrast material in the distal esophagus could be due to poor motility and/or reflux.  There is wall thickening and inflammatory change about a loop of jejunum in the midline immediately anterior to the right common iliac artery.  There are a tiny locules of free air adjacent to this abnormal loop of small bowel.  There is some free fluid present.  The no pneumatosis or  portal venous gas is identified. No focal fluid collection is identified.  There appear be some tiny gravel type stones layering dependently within the gallbladder.  The gallbladder is otherwise unremarkable. The liver, spleen, adrenal glands and pancreas appear normal.  A punctate nonobstructing stone is identified in the lower pole of the right kidney.  There is an exophytic lesion off the midpole of the left kidney measuring 1.9 cm with Hounsfield unit measurements of 22.2 which cannot be definitively characterized.  An additional smaller intraparenchymal low attenuating lesion is seen in the left kidney which also cannot be characterized.  The colon and appendix appear normal.  IMPRESSION:  1.  Free air within the abdomen with fluid present compatible with bowel perforation.  Free air is centered about a loop of jejunum which has thickened walls.  Differential considerations include inflammatory change and ischemia. 2.  Indeterminate lesion lower pole left kidney.  Non emergent renal ultrasound recommended for further characterization. 3.  Punctate nonobstructing stone lower pole right kidney. 4.  Likely gravel type gallstones without evidence of cholecystitis.  Critical Value/emergent results were called by telephone at the time of interpretation on 10/23/2011 at 9:20 p.m. to Dr. Rhunette Croft, who verbally acknowledged these results.   Original Report Authenticated By: Bernadene Bell. Maricela Curet, M.D.    US Renal Port  10/24/2011  *RADIOLOGY REPORT*  Clinical Data: Follow-up renal lesions seen on CT.  RENAL/URINARY TRACT ULTRASOUND COMPLETE  Comparison:  10/23/2011 CT  Findings:  Right Kidney:  Measures 10.4 cm.  No hydronephrosis or focal abnormality.  Left Kidney:  Measures 11.9 cm.  Exophytic from the upper/interpolar region, there is a nearly anechoic lesion with increased through transmission that measures 1.7 cm. The margin near its origin from the renal parenchyma is ill defined for example on image 20. This is  favored to be technical.  Subsequent images of the lesion more lateral to the renal parenchyma demonstrate well circumscribed margins. Within the interpolar kidney slightly inferior to the dominant lesion, there is a 1.2 cm nearly anechoic focus of increased through transmission, most in keeping with a cyst.  Bladder:  Decompressed by Foley catheter.  IMPRESSION: No hydronephrosis.  1.7 and 1.2 cm left renal lesions are most in keeping with mildly complex cysts. A 6-12 month follow-up to confirm stability is reasonable.   Original Report Authenticated By: Waneta Martins, M.D.    Dg Abd Acute W/chest  10/23/2011  *  RADIOLOGY REPORT*  Clinical Data: Fever.  Abdominal pain radiating towards the groin.  ACUTE ABDOMEN SERIES (ABDOMEN 2 VIEW & CHEST 1 VIEW)  Comparison: 04/11/2003  Findings: On the chest radiograph, right retro cardiac density favors hiatal hernia.  Tortuous thoracic aorta noted.  There is suspected subsegmental atelectasis at the left lung base.  Faint linear edge noted along the right hemidiaphragm.  Although the appearance is not classic for free intraperitoneal gas, intraperitoneal gas becomes difficult to completely exclude given this appearance.  Accordingly, I recommend either a left side down lateral decubitus view the abdomen or abdomen CT for further characterization.  Gas and stool noted in the colon several air-fluid levels are present in nondilated central abdominal small bowel, nonspecific.  IMPRESSION:  1.  Several nonspecific air-fluid levels and central abdominal loops of nondilated small bowel, query ileus. 2. Faint linear structure extending along the right hemidiaphragm is probably due to clothing and seems too thin to represent the right hemidiaphragm in the setting of free intraperitoneal gas. However, I do recommend a left side down lateral decubitus view of the abdomen for further confirmation that there is no free intraperitoneal gas.  Alternatively, abdomen CT could be  utilized. 3.  Retrocardiac density favors hiatal hernia. 4.  Linear subsegmental atelectasis in the left lower lobe.   Original Report Authenticated By: Dellia Cloud, M.D.     Microbiology: Recent Results (from the past 240 hour(s))  CULTURE, BLOOD (ROUTINE X 2)     Status: Normal (Preliminary result)   Collection Time   10/23/11 10:00 PM      Component Value Range Status Comment   Specimen Description BLOOD RIGHT ANTECUBITAL   Final    Special Requests BOTTLES DRAWN AEROBIC AND ANAEROBIC 5 CC EA   Final    Culture  Setup Time 10/24/2011 08:23   Final    Culture     Final    Value:        BLOOD CULTURE RECEIVED NO GROWTH TO DATE CULTURE WILL BE HELD FOR 5 DAYS BEFORE ISSUING A FINAL NEGATIVE REPORT   Report Status PENDING   Incomplete   CULTURE, BLOOD (ROUTINE X 2)     Status: Normal (Preliminary result)   Collection Time   10/23/11 10:10 PM      Component Value Range Status Comment   Specimen Description BLOOD RIGHT ARM   Final    Special Requests BOTTLES DRAWN AEROBIC ONLY 3 CC   Final    Culture  Setup Time 10/24/2011 08:23   Final    Culture     Final    Value:        BLOOD CULTURE RECEIVED NO GROWTH TO DATE CULTURE WILL BE HELD FOR 5 DAYS BEFORE ISSUING A FINAL NEGATIVE REPORT   Report Status PENDING   Incomplete   WOUND CULTURE     Status: Normal   Collection Time   10/24/11  3:39 AM      Component Value Range Status Comment   Specimen Description ABDOMEN   Final    Special Requests NONE   Final    Gram Stain     Final    Value: MODERATE WBC PRESENT, PREDOMINANTLY PMN     NO SQUAMOUS EPITHELIAL CELLS SEEN     NO ORGANISMS SEEN   Culture FEW ESCHERICHIA COLI   Final    Report Status 10/27/2011 FINAL   Final    Organism ID, Bacteria ESCHERICHIA COLI   Final   ANAEROBIC CULTURE  Status: Normal   Collection Time   10/24/11  3:44 AM      Component Value Range Status Comment   Specimen Description ABDOMEN   Final    Special Requests NONE   Final    Gram Stain      Final    Value: MODERATE WBC PRESENT, PREDOMINANTLY PMN     NO SQUAMOUS EPITHELIAL CELLS SEEN     NO ORGANISMS SEEN   Culture NO ANAEROBES ISOLATED   Final    Report Status 10/29/2011 FINAL   Final      Labs: Basic Metabolic Panel:  Lab 10/30/11 8295 10/29/11 0518 10/28/11 0330 10/27/11 0407 10/26/11 0429  NA 137 135 136 134* 135  K 3.3* 3.7 3.3* 3.3* 3.6  CL 103 101 102 100 99  CO2 23 24 25 27 29   GLUCOSE 111* 93 102* 125* 134*  BUN 19 16 14 15 17   CREATININE 1.10 1.09 1.00 1.05 1.12  CALCIUM 8.2* 8.4 8.6 8.2* 8.6  MG 1.9 1.9 1.9 1.9 1.9  PHOS -- -- -- -- --   Liver Function Tests:  Lab 10/30/11 0415 10/29/11 0518 10/28/11 0330 10/27/11 0407 10/26/11 0429  AST 61* 49* 23 11 13   ALT 36 26 13 7 8   ALKPHOS 52 54 57 50 69  BILITOT 0.3 0.5 0.6 0.9 1.3*  PROT 5.9* 5.9* 6.2 5.5* 5.9*  ALBUMIN 2.3* 2.2* 2.3* 2.0* 2.4*    Lab 10/23/11 1755  LIPASE 41  AMYLASE --   No results found for this basename: AMMONIA:5 in the last 168 hours CBC:  Lab 10/30/11 0415 10/29/11 0518 10/28/11 0330 10/27/11 0407 10/26/11 0429 10/24/11 0451 10/23/11 1755  WBC 4.7 4.6 5.2 6.3 9.0 -- --  NEUTROABS -- -- -- -- -- 5.4 7.4  HGB 12.2* 12.1* 11.9* 11.2* 12.6* -- --  HCT 35.2* 35.6* 35.0* 33.6* 38.8* -- --  MCV 85.9 86.4 87.3 88.2 89.4 -- --  PLT 217 203 195 161 162 -- --   Cardiac Enzymes:  Lab 10/23/11 1755  CKTOTAL --  CKMB --  CKMBINDEX --  TROPONINI <0.30   BNP: BNP (last 3 results) No results found for this basename: PROBNP:3 in the last 8760 hours CBG:  Lab 10/30/11 0710 10/29/11 2049 10/29/11 1712 10/29/11 1205 10/29/11 0743  GLUCAP 121* 149* 99 146* 137*    Time coordinating discharge:  Signed:  Rhetta Mura  [facility] 10/30/2011, 10:09 AM

## 2011-10-31 ENCOUNTER — Telehealth (INDEPENDENT_AMBULATORY_CARE_PROVIDER_SITE_OTHER): Payer: Self-pay | Admitting: General Surgery

## 2011-10-31 NOTE — Telephone Encounter (Signed)
LMOM letting pt know that his first PO appt w/ Dr. Daphine Deutscher will be on 10/11 at 2:30.

## 2011-11-15 ENCOUNTER — Telehealth (INDEPENDENT_AMBULATORY_CARE_PROVIDER_SITE_OTHER): Payer: Self-pay | Admitting: General Surgery

## 2011-11-15 NOTE — Telephone Encounter (Signed)
Patient's wife called stating patient had diarrhea on Thurs/Friday/Saturday but it is now getting better. He is also having a lot of gas. I advised that since it is getting better I would watch it for now and call us if symptoms increase. To keep appt on Friday with Dr Daphine Deutscher.

## 2011-11-16 ENCOUNTER — Encounter (INDEPENDENT_AMBULATORY_CARE_PROVIDER_SITE_OTHER): Payer: Self-pay | Admitting: General Surgery

## 2011-11-16 ENCOUNTER — Ambulatory Visit (INDEPENDENT_AMBULATORY_CARE_PROVIDER_SITE_OTHER): Payer: Medicare Other | Admitting: General Surgery

## 2011-11-16 VITALS — BP 130/81 | HR 88 | Temp 98.6°F | Resp 18 | Ht 69.0 in | Wt 174.4 lb

## 2011-11-16 DIAGNOSIS — Z9889 Other specified postprocedural states: Secondary | ICD-10-CM

## 2011-11-16 NOTE — Progress Notes (Signed)
Patient ID: Michael Mejia, male   DOB: 12-17-1940, 71 y.o.   MRN: [id number]  The patient is a 71 year old male who was recently operated on by Dr. Daphine Deutscher for perforated diverticulum. The patient had a small bowel resection and anastomosis. He was discharged home with antibiotics and has been doing well to this point. He states that over night he had some pain in the right CVA area as well as some nausea with one episode of emesis. Patient denies emesis his back and mild abdominal pain resolved. He states he's had no fevers while at home with normal blood pressure. The patient his wife keep a excellent record the patient's temperature pressure heart rate bowel regimen.  On exam:  His abdominal wound is clean dry and intact. No rebound no guarding or peritoneal signs. Patient normoactive bowel sounds.  Assessment and plan:  71 year old male status post small bowel resection.  -The patient continue with his normal diet at this time and his normal activities of daily living  -Patient is an appointment to see Dr. Daphine Deutscher postoperative followup.

## 2011-11-17 ENCOUNTER — Telehealth (INDEPENDENT_AMBULATORY_CARE_PROVIDER_SITE_OTHER): Payer: Self-pay

## 2011-11-17 ENCOUNTER — Telehealth (INDEPENDENT_AMBULATORY_CARE_PROVIDER_SITE_OTHER): Payer: Self-pay | Admitting: General Surgery

## 2011-11-17 ENCOUNTER — Other Ambulatory Visit (INDEPENDENT_AMBULATORY_CARE_PROVIDER_SITE_OTHER): Payer: Self-pay | Admitting: Surgery

## 2011-11-17 DIAGNOSIS — Z9889 Other specified postprocedural states: Secondary | ICD-10-CM

## 2011-11-17 NOTE — Telephone Encounter (Signed)
Pt's wife called this morning to report that after her husband's appt with Dr. Derrell Lolling yesterday he spike a 101.4 temp last night.  He is 98.4 this morning, but is still weak and tired.  Please advise.

## 2011-11-17 NOTE — Telephone Encounter (Signed)
Spoke with pt and informed him that we set him up to have a CT abd pelvis on 10/11 and that he needs to arrive at 1:00.  Explained that he cannot eat anything for 4 hours before the test however liquids and meds are fine.  He said he understood. Told him he would need to go to the [facility] building at [address]

## 2011-11-18 ENCOUNTER — Encounter (INDEPENDENT_AMBULATORY_CARE_PROVIDER_SITE_OTHER): Payer: Medicare Other | Admitting: Surgery

## 2011-11-18 ENCOUNTER — Ambulatory Visit
Admission: RE | Admit: 2011-11-18 | Discharge: 2011-11-18 | Disposition: A | Discharge status: Home / Self Care | Payer: Medicare Other | Source: Ambulatory Visit | Attending: Surgery | Admitting: Surgery

## 2011-11-18 DIAGNOSIS — Z9889 Other specified postprocedural states: Secondary | ICD-10-CM

## 2011-11-18 MED ORDER — IOHEXOL 300 MG/ML  SOLN
100.0000 mL | Freq: Once | INTRAMUSCULAR | Status: AC | PRN
  Administered 2011-11-18: 100 mL via INTRAVENOUS

## 2011-11-18 MED ORDER — IOHEXOL 300 MG/ML  SOLN
30.0000 mL | Freq: Once | INTRAMUSCULAR | Status: AC | PRN
  Administered 2011-11-18: 30 mL via ORAL

## 2011-11-22 ENCOUNTER — Telehealth (INDEPENDENT_AMBULATORY_CARE_PROVIDER_SITE_OTHER): Payer: Self-pay | Admitting: General Surgery

## 2011-11-22 NOTE — Telephone Encounter (Signed)
Spoke with pt's wife and explained per Dr. Daphine Deutscher that his CT showed no abscesses or obstructions and it only showed postoperative changes which is what we were expecting.  We have them scheduled to come in 10/18 at 3:10 and we will f/u with that CT and discuss where to go from here with his care.

## 2011-11-23 ENCOUNTER — Encounter (INDEPENDENT_AMBULATORY_CARE_PROVIDER_SITE_OTHER): Payer: Self-pay | Admitting: Surgery

## 2011-11-25 ENCOUNTER — Ambulatory Visit (INDEPENDENT_AMBULATORY_CARE_PROVIDER_SITE_OTHER): Payer: Medicare Other | Admitting: Surgery

## 2011-11-25 ENCOUNTER — Encounter (INDEPENDENT_AMBULATORY_CARE_PROVIDER_SITE_OTHER): Payer: Self-pay | Admitting: Surgery

## 2011-11-25 VITALS — BP 112/60 | HR 88 | Temp 97.0°F | Resp 16 | Ht 69.0 in | Wt 177.8 lb

## 2011-11-25 DIAGNOSIS — R198 Other specified symptoms and signs involving the digestive system and abdomen: Secondary | ICD-10-CM

## 2011-11-25 DIAGNOSIS — R69 Illness, unspecified: Secondary | ICD-10-CM

## 2011-11-25 NOTE — Progress Notes (Signed)
Michael Mejia 71 y.o.  Body mass index is 26.26 kg/(m^2).  Patient Active Problem List  Diagnosis  . Perforated viscus  . Renal stones  . Atrial fibrillation, not on coumadin  . Elevated blood sugar    Allergies  Allergen Reactions  . Codeine Nausea Only    Past Surgical History  Procedure Date  . Rotator cuff repair   . Laparotomy 10/24/2011    Procedure: EXPLORATORY LAPAROTOMY;  Surgeon: Valarie Merino, MD;  Location: [facility];  Service: General;  Laterality: N/A;  . Bowel resection 10/24/2011    Procedure: SMALL BOWEL RESECTION;  Surgeon: Valarie Merino, MD;  Location: [facility];  Service: General;;  for small bowel perforation    READE,ROBERT Lyn Hollingshead, MD 1. Perforated viscus     Incision has pretty much healed.  Back to eating and doing well.  Return prn Matt B. Daphine Deutscher, MD, [facility] [phone] beeper [phone]  11/25/2011 3:24 PM

## 2011-11-25 NOTE — Patient Instructions (Signed)
Thanks for your patience.  If you need further assistance after leaving the office, please call our office and speak with a [facility] nurse.  [phone].  If you want to leave a message for Dr. Quetzali Heinle, please call his office phone at [phone]. 

## 2012-03-24 ENCOUNTER — Other Ambulatory Visit: Payer: Self-pay

## 2012-09-12 ENCOUNTER — Other Ambulatory Visit: Payer: Self-pay

## 2012-12-13 ENCOUNTER — Other Ambulatory Visit: Payer: Self-pay

## 2013-02-13 ENCOUNTER — Emergency Department (HOSPITAL_COMMUNITY): Payer: Medicare Other

## 2013-02-13 ENCOUNTER — Inpatient Hospital Stay (HOSPITAL_COMMUNITY)
Admission: EM | Admit: 2013-02-13 | Discharge: 2013-02-18 | DRG: 418 | Disposition: A | Discharge status: Home Health | Payer: Medicare Other | Attending: General Surgery | Admitting: General Surgery

## 2013-02-13 ENCOUNTER — Encounter (HOSPITAL_COMMUNITY): Payer: Self-pay | Admitting: Emergency Medicine

## 2013-02-13 DIAGNOSIS — I4892 Unspecified atrial flutter: Secondary | ICD-10-CM | POA: Diagnosis present

## 2013-02-13 DIAGNOSIS — K7689 Other specified diseases of liver: Secondary | ICD-10-CM | POA: Diagnosis present

## 2013-02-13 DIAGNOSIS — R198 Other specified symptoms and signs involving the digestive system and abdomen: Secondary | ICD-10-CM

## 2013-02-13 DIAGNOSIS — K8000 Calculus of gallbladder with acute cholecystitis without obstruction: Secondary | ICD-10-CM

## 2013-02-13 DIAGNOSIS — Z9889 Other specified postprocedural states: Secondary | ICD-10-CM

## 2013-02-13 DIAGNOSIS — Z885 Allergy status to narcotic agent status: Secondary | ICD-10-CM

## 2013-02-13 DIAGNOSIS — Z79899 Other long term (current) drug therapy: Secondary | ICD-10-CM

## 2013-02-13 DIAGNOSIS — K8062 Calculus of gallbladder and bile duct with acute cholecystitis without obstruction: Principal | ICD-10-CM | POA: Diagnosis present

## 2013-02-13 DIAGNOSIS — J4489 Other specified chronic obstructive pulmonary disease: Secondary | ICD-10-CM | POA: Diagnosis present

## 2013-02-13 DIAGNOSIS — E785 Hyperlipidemia, unspecified: Secondary | ICD-10-CM | POA: Diagnosis present

## 2013-02-13 DIAGNOSIS — E7800 Pure hypercholesterolemia, unspecified: Secondary | ICD-10-CM | POA: Diagnosis present

## 2013-02-13 DIAGNOSIS — Z7901 Long term (current) use of anticoagulants: Secondary | ICD-10-CM

## 2013-02-13 DIAGNOSIS — Z9049 Acquired absence of other specified parts of digestive tract: Secondary | ICD-10-CM

## 2013-02-13 DIAGNOSIS — Z87891 Personal history of nicotine dependence: Secondary | ICD-10-CM

## 2013-02-13 DIAGNOSIS — K8020 Calculus of gallbladder without cholecystitis without obstruction: Secondary | ICD-10-CM

## 2013-02-13 DIAGNOSIS — N200 Calculus of kidney: Secondary | ICD-10-CM

## 2013-02-13 DIAGNOSIS — M129 Arthropathy, unspecified: Secondary | ICD-10-CM | POA: Diagnosis present

## 2013-02-13 DIAGNOSIS — N289 Disorder of kidney and ureter, unspecified: Secondary | ICD-10-CM | POA: Diagnosis present

## 2013-02-13 DIAGNOSIS — R188 Other ascites: Secondary | ICD-10-CM | POA: Diagnosis present

## 2013-02-13 DIAGNOSIS — N400 Enlarged prostate without lower urinary tract symptoms: Secondary | ICD-10-CM | POA: Diagnosis present

## 2013-02-13 DIAGNOSIS — R69 Illness, unspecified: Secondary | ICD-10-CM

## 2013-02-13 DIAGNOSIS — E739 Lactose intolerance, unspecified: Secondary | ICD-10-CM | POA: Diagnosis present

## 2013-02-13 DIAGNOSIS — J449 Chronic obstructive pulmonary disease, unspecified: Secondary | ICD-10-CM | POA: Diagnosis present

## 2013-02-13 DIAGNOSIS — I4891 Unspecified atrial fibrillation: Secondary | ICD-10-CM

## 2013-02-13 DIAGNOSIS — I10 Essential (primary) hypertension: Secondary | ICD-10-CM | POA: Diagnosis present

## 2013-02-13 LAB — COMPREHENSIVE METABOLIC PANEL
ALBUMIN: 3.8 g/dL (ref 3.5–5.2)
ALK PHOS: 78 U/L (ref 39–117)
ALT: 105 U/L — AB (ref 0–53)
AST: 273 U/L — ABNORMAL HIGH (ref 0–37)
BUN: 20 mg/dL (ref 6–23)
CALCIUM: 9.5 mg/dL (ref 8.4–10.5)
CO2: 26 mEq/L (ref 19–32)
Chloride: 101 mEq/L (ref 96–112)
Creatinine, Ser: 1.22 mg/dL (ref 0.50–1.35)
GFR calc Af Amer: 67 mL/min — ABNORMAL LOW (ref >90)
GFR calc non Af Amer: 58 mL/min — ABNORMAL LOW (ref >90)
Glucose, Bld: 157 mg/dL — ABNORMAL HIGH (ref 70–99)
POTASSIUM: 4.5 meq/L (ref 3.7–5.3)
SODIUM: 140 meq/L (ref 137–147)
TOTAL PROTEIN: 7.3 g/dL (ref 6.0–8.3)
Total Bilirubin: 2.4 mg/dL — ABNORMAL HIGH (ref 0.3–1.2)

## 2013-02-13 LAB — URINALYSIS, ROUTINE W REFLEX MICROSCOPIC
BILIRUBIN URINE: NEGATIVE
Glucose, UA: NEGATIVE mg/dL
Hgb urine dipstick: NEGATIVE
Ketones, ur: NEGATIVE mg/dL
Leukocytes, UA: NEGATIVE
NITRITE: NEGATIVE
PH: 6.5 (ref 5.0–8.0)
Protein, ur: NEGATIVE mg/dL
SPECIFIC GRAVITY, URINE: 1.017 (ref 1.005–1.030)
Urobilinogen, UA: 1 mg/dL (ref 0.0–1.0)

## 2013-02-13 LAB — POCT I-STAT TROPONIN I: TROPONIN I, POC: 0.01 ng/mL (ref 0.00–0.08)

## 2013-02-13 LAB — CBC WITH DIFFERENTIAL/PLATELET
BASOS PCT: 0 % (ref 0–1)
Basophils Absolute: 0 10*3/uL (ref 0.0–0.1)
EOS ABS: 0 10*3/uL (ref 0.0–0.7)
Eosinophils Relative: 0 % (ref 0–5)
HCT: 44.2 % (ref 39.0–52.0)
HEMOGLOBIN: 15.3 g/dL (ref 13.0–17.0)
Lymphocytes Relative: 10 % — ABNORMAL LOW (ref 12–46)
Lymphs Abs: 0.9 10*3/uL (ref 0.7–4.0)
MCH: 30.5 pg (ref 26.0–34.0)
MCHC: 34.6 g/dL (ref 30.0–36.0)
MCV: 88.2 fL (ref 78.0–100.0)
Monocytes Absolute: 0.4 10*3/uL (ref 0.1–1.0)
Monocytes Relative: 4 % (ref 3–12)
NEUTROS PCT: 86 % — AB (ref 43–77)
Neutro Abs: 7.6 10*3/uL (ref 1.7–7.7)
PLATELETS: 164 10*3/uL (ref 150–400)
RBC: 5.01 MIL/uL (ref 4.22–5.81)
RDW: 13.3 % (ref 11.5–15.5)
WBC: 8.9 10*3/uL (ref 4.0–10.5)

## 2013-02-13 LAB — CG4 I-STAT (LACTIC ACID): LACTIC ACID, VENOUS: 2.16 mmol/L (ref 0.5–2.2)

## 2013-02-13 LAB — LIPASE, BLOOD: LIPASE: 29 U/L (ref 11–59)

## 2013-02-13 MED ORDER — HYDROMORPHONE HCL PF 1 MG/ML IJ SOLN
1.0000 mg | Freq: Once | INTRAMUSCULAR | Status: AC
Start: 2013-02-13 — End: 2013-02-13
  Administered 2013-02-13: 1 mg via INTRAVENOUS
  Filled 2013-02-13: qty 1

## 2013-02-13 MED ORDER — HYDROMORPHONE HCL PF 1 MG/ML IJ SOLN: 0.5000 mg | INTRAMUSCULAR | Status: DC | PRN

## 2013-02-13 MED ORDER — ONDANSETRON HCL 4 MG/2ML IJ SOLN
4.0000 mg | Freq: Once | INTRAMUSCULAR | Status: AC
  Administered 2013-02-13: 4 mg via INTRAVENOUS
  Filled 2013-02-13: qty 2

## 2013-02-13 MED ORDER — ONDANSETRON HCL 4 MG/2ML IJ SOLN
4.0000 mg | Freq: Four times a day (QID) | INTRAMUSCULAR | Status: DC | PRN
Start: 2013-02-13 — End: 2013-02-18
  Administered 2013-02-15: 10:00:00 4 mg via INTRAVENOUS
  Filled 2013-02-13: qty 2

## 2013-02-13 MED ORDER — DIPHENHYDRAMINE HCL 12.5 MG/5ML PO ELIX: 12.5000 mg | ORAL_SOLUTION | Freq: Four times a day (QID) | ORAL | Status: DC | PRN

## 2013-02-13 MED ORDER — SIMVASTATIN 40 MG PO TABS
40.0000 mg | ORAL_TABLET | Freq: Every evening | ORAL | Status: DC
  Administered 2013-02-13 – 2013-02-17 (×5): 40 mg via ORAL
  Filled 2013-02-13 (×6): qty 1

## 2013-02-13 MED ORDER — IOHEXOL 300 MG/ML  SOLN
50.0000 mL | Freq: Once | INTRAMUSCULAR | Status: AC | PRN
  Administered 2013-02-13: 50 mL via ORAL

## 2013-02-13 MED ORDER — PIPERACILLIN-TAZOBACTAM 3.375 G IVPB
3.3750 g | Freq: Three times a day (TID) | INTRAVENOUS | Status: DC
  Administered 2013-02-13 – 2013-02-18 (×15): 3.375 g via INTRAVENOUS
  Filled 2013-02-13 (×16): qty 50

## 2013-02-13 MED ORDER — DIPHENHYDRAMINE HCL 50 MG/ML IJ SOLN: 12.5000 mg | Freq: Four times a day (QID) | INTRAMUSCULAR | Status: DC | PRN

## 2013-02-13 MED ORDER — HEPARIN (PORCINE) IN NACL 100-0.45 UNIT/ML-% IJ SOLN
1200.0000 [IU]/h | INTRAMUSCULAR | Status: DC
  Administered 2013-02-13: 18:00:00 1200 [IU]/h via INTRAVENOUS
  Filled 2013-02-13 (×2): qty 250

## 2013-02-13 MED ORDER — OXYCODONE-ACETAMINOPHEN 5-325 MG PO TABS
1.0000 | ORAL_TABLET | ORAL | Status: DC | PRN
  Administered 2013-02-15: 1 via ORAL
  Filled 2013-02-13: qty 1

## 2013-02-13 MED ORDER — ACETAMINOPHEN 325 MG PO TABS
650.0000 mg | ORAL_TABLET | Freq: Four times a day (QID) | ORAL | Status: DC | PRN
Start: 2013-02-13 — End: 2013-02-18
  Administered 2013-02-14 – 2013-02-17 (×4): 650 mg via ORAL
  Filled 2013-02-13 (×4): qty 2

## 2013-02-13 MED ORDER — SODIUM CHLORIDE 0.9 % IV BOLUS (SEPSIS)
1000.0000 mL | Freq: Once | INTRAVENOUS | Status: AC
  Administered 2013-02-13: 1000 mL via INTRAVENOUS

## 2013-02-13 MED ORDER — TAMSULOSIN HCL 0.4 MG PO CAPS
0.4000 mg | ORAL_CAPSULE | Freq: Every evening | ORAL | Status: DC
  Administered 2013-02-13 – 2013-02-17 (×5): 0.4 mg via ORAL
  Filled 2013-02-13 (×6): qty 1

## 2013-02-13 MED ORDER — LORAZEPAM 0.5 MG PO TABS
0.5000 mg | ORAL_TABLET | Freq: Once | ORAL | Status: AC
  Administered 2013-02-13: 0.5 mg via ORAL
  Filled 2013-02-13: qty 1

## 2013-02-13 MED ORDER — ACETAMINOPHEN 650 MG RE SUPP: 650.0000 mg | Freq: Four times a day (QID) | RECTAL | Status: DC | PRN

## 2013-02-13 MED ORDER — IOHEXOL 300 MG/ML  SOLN
100.0000 mL | Freq: Once | INTRAMUSCULAR | Status: AC | PRN
  Administered 2013-02-13: 100 mL via INTRAVENOUS

## 2013-02-13 MED ORDER — METOPROLOL TARTRATE 25 MG PO TABS
25.0000 mg | ORAL_TABLET | Freq: Two times a day (BID) | ORAL | Status: DC
  Administered 2013-02-13 – 2013-02-17 (×9): 25 mg via ORAL
  Filled 2013-02-13 (×12): qty 1

## 2013-02-13 MED ORDER — PIPERACILLIN-TAZOBACTAM 3.375 G IVPB 30 MIN
3.3750 g | Freq: Once | INTRAVENOUS | Status: AC
  Administered 2013-02-13: 3.375 g via INTRAVENOUS
  Filled 2013-02-13: qty 50

## 2013-02-13 MED ORDER — MORPHINE SULFATE 4 MG/ML IJ SOLN
4.0000 mg | Freq: Once | INTRAMUSCULAR | Status: AC
  Administered 2013-02-13: 4 mg via INTRAVENOUS
  Filled 2013-02-13: qty 1

## 2013-02-13 NOTE — ED Provider Notes (Signed)
8:51 AM  Dg Abd 1 View  02/13/2013   CLINICAL DATA:  Mid abdominal pain with nausea and vomiting. Prior history small bowel resection.  EXAM: ABDOMEN - 1 VIEW  COMPARISON:  CT of the abdomen and pelvis November 18, 2011.  FINDINGS: The bowel gas pattern is nondilated and nonobstructive. Mild amount of ascending and transverse colonic retained large bowel stool. Surgical staples in the left midabdomen. No intraperitoneal free fluid, pathologic calcifications. Phleboliths in the pelvis. Soft tissue planes and included osseous structures are nonsuspicious.  IMPRESSION: Mild amount of retained large bowel stool, nonspecific bowel gas pattern.   Electronically Signed   By: Elon Alas   On: 02/13/2013 06:43   Ct Abdomen Pelvis W Contrast  02/13/2013   CLINICAL DATA:  Abdominal pain, nausea and vomiting beginning 02/12/2013.  EXAM: CT ABDOMEN AND PELVIS WITH CONTRAST  TECHNIQUE: Multidetector CT imaging of the abdomen and pelvis was performed using the standard protocol following bolus administration of intravenous contrast.  CONTRAST:  100 mL OMNIPAQUE IOHEXOL 300 MG/ML  SOLN  COMPARISON:  CT abdomen and pelvis 11/18/2011.  FINDINGS: There is cardiomegaly. No pleural or pericardial effusion. Imaged lung parenchyma demonstrates early dependent atelectasis.  There is a small amount of pericholecystic fluid with trace amount of perihepatic ascites also identified. The liver is mildly low attenuating consistent with fatty infiltration. No focal liver lesion is identified. The spleen, biliary tree, adrenal glands and pancreas are unremarkable. Small bilateral renal cysts are unchanged. The stomach appears normal. Small bowel anastomosis is identified as on prior studies with minimal dilatation of the anastomosis likely due to atony. There is no small bowel obstruction. The colon and appendix appear normal. Small fat containing bilateral inguinal hernias are noted. The urinary bladder, prostate gland and seminal  vesicles are unremarkable. Scattered aortoiliac atherosclerosis is noted. There is no lymphadenopathy. No focal bony abnormality.  IMPRESSION: Trace amount of perihepatic ascites with a small amount of pericholecystic fluid is nonspecific. Cause for the fluid is not identified. If there is concern for cholecystitis, limited right upper quadrant ultrasound could be used for further evaluation.  Cardiomegaly.  Mild appearing fatty infiltration of the liver.   Electronically Signed   By: Inge Rise M.D.   On: 02/13/2013 07:14   US Abdomen Limited Ruq  02/13/2013   CLINICAL DATA:  Increased liver function tests.  EXAM: US ABDOMEN LIMITED - RIGHT UPPER QUADRANT  COMPARISON:  None.  FINDINGS: Gallbladder  Sludge identified in gallbladder. There is a tiny amount of pericholecystic fluid. The gallbladder wall appears thickened at between 1.3 and 0.5 cm. Sonographer reports negative Murphy's sign.  Common bile duct  Diameter: 0.4 cm.  Liver:  The liver demonstrates increased echogenicity consistent with fatty infiltration. No focal lesion or intrahepatic biliary ductal dilatation is identified. There is hepatopetal flow in the portal vein.  IMPRESSION: Gallbladder sludge with wall thickening and pericholecystic fluid but no Murphy's sign. Wall thickening is likely due to pericholecystic fluid but cholecystitis is also possible. HIDA scan could be used for further evaluation.  Fatty infiltration of the liver.   Electronically Signed   By: Inge Rise M.D.   On: 02/13/2013 08:24    Assuming care from Dr. Dina Rich in sign out. Patient is a 73 year old male with epigastric to right upper quadrant pain. Had a right upper quadrant ultrasound significant for gallbladder sludge, wall thickening or pericholecystic cystic . Patient does have some tenderness on exam, not peritonitis. Still having some pain, although improved with meds.  LFTs are abnormal and elevated from prior labs. Clinical concern for cholecystitis.  Will consult general surgery. Of note, patient is on xarelto to for history of atrial fibrillation. Past surgical hx significant for perforated viscus with surgery done by Dr Hassell Done.   Virgel Manifold, MD 02/13/13 [phone]

## 2013-02-13 NOTE — H&P (Signed)
Michael Mejia is an 73 y.o. male.   Chief Complaint: abdominal pain, nausea, and vomiting. HPI: Pt doing well till last PM after eating cabbage rolls, with sausage and hamburger.  Say about 11 PM he started having pain and burning sensation mid epigastric site.  He tried Rolaids, beano, various positions to make symptoms better nothing worked.  He had a BM and while trying various positions developed nausea and vomited.  He felt better for a time after that and took a nap, but awoke again at [facility] with severe pain and burning mid abdomen.  He came to the ER and pain was a little better here.  Labs show a normal WBC but elevated bilirubin 2.4, elevated AST 273, ALT 105. Creatinine is borderline normal 1.22.  Single view abdomen was normal, CT abdomen shows some ascites with some pericholecystic fluid.  SB anastomosis is normal with no SBO.  US shows:  Sludge in GB, GB wall thickening and pericholecystic fluid.  We are ask to see.  He reports prior episodes of milder symptoms on and off since Thanksgiving, he attributed to gas.  All occurred after eating.    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 Johnathan Hausen      Past Surgical History  Procedure Laterality Date  . Rotator cuff repair    . Laparotomy  10/24/2011    Procedure: EXPLORATORY LAPAROTOMY;  Surgeon: Pedro Earls, MD;  Location: [facility];  Service: General;  Laterality: N/A;  . Bowel resection  10/24/2011    Procedure: SMALL BOWEL RESECTION;  Surgeon: Pedro Earls, MD;  Location: [facility];  Service: General;;  for small bowel perforation     Family History  Problem Relation Age of Onset  . Pancreatic cancer Mother   . Pancreatic cancer  Brother    Social History:  reports that he has quit smoking. He does not have any smokeless tobacco history on file. He reports that he does not drink alcohol or use illicit drugs. 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 Allergies:  Allergies  Allergen Reactions  . Codeine Nausea Only  . Lactose Intolerance (Gi) Other (See Comments)    Gas pain   Prior to Admission medications   Medication Sig Start Date End Date Taking? Authorizing Provider  metoprolol (LOPRESSOR) 50 MG tablet Take 25 mg by mouth 2 (two) times daily.    Yes Historical Provider, MD  Rivaroxaban (XARELTO) 20 MG TABS tablet Take 20 mg by mouth every evening.   Yes Historical Provider, MD  simvastatin (ZOCOR) 40 MG tablet Take 40 mg by mouth every evening.   Yes Historical Provider, MD  Tamsulosin HCl (FLOMAX) 0.4 MG CAPS Take 0.4 mg by mouth every evening.   Yes Historical Provider, MD    Results for orders placed during the hospital encounter of 02/13/13 (from the past 48 hour(s))  CBC WITH DIFFERENTIAL     Status: Abnormal   Collection Time    02/13/13  3:51 AM      Result Value Range   WBC 8.9  4.0 - 10.5 K/uL  RBC 5.01  4.22 - 5.81 MIL/uL   Hemoglobin 15.3  13.0 - 17.0 g/dL   HCT 44.2  39.0 - 52.0 %   MCV 88.2  78.0 - 100.0 fL   MCH 30.5  26.0 - 34.0 pg   MCHC 34.6  30.0 - 36.0 g/dL   RDW 13.3  11.5 - 15.5 %   Platelets 164  150 - 400 K/uL   Neutrophils Relative % 86 (*) 43 - 77 %   Neutro Abs 7.6  1.7 - 7.7 K/uL   Lymphocytes Relative 10 (*) 12 - 46 %   Lymphs Abs 0.9  0.7 - 4.0 K/uL   Monocytes Relative 4  3 - 12 %   Monocytes Absolute 0.4  0.1 - 1.0 K/uL   Eosinophils Relative 0  0 - 5 %   Eosinophils Absolute 0.0  0.0 - 0.7 K/uL   Basophils Relative 0  0 - 1 %   Basophils Absolute 0.0  0.0 - 0.1 K/uL  COMPREHENSIVE METABOLIC PANEL     Status: Abnormal   Collection Time    02/13/13  3:51  AM      Result Value Range   Sodium 140  137 - 147 mEq/L   Potassium 4.5  3.7 - 5.3 mEq/L   Chloride 101  96 - 112 mEq/L   CO2 26  19 - 32 mEq/L   Glucose, Bld 157 (*) 70 - 99 mg/dL   BUN 20  6 - 23 mg/dL   Creatinine, Ser 1.22  0.50 - 1.35 mg/dL   Calcium 9.5  8.4 - 10.5 mg/dL   Total Protein 7.3  6.0 - 8.3 g/dL   Albumin 3.8  3.5 - 5.2 g/dL   AST 273 (*) 0 - 37 U/L   ALT 105 (*) 0 - 53 U/L   Alkaline Phosphatase 78  39 - 117 U/L   Total Bilirubin 2.4 (*) 0.3 - 1.2 mg/dL   GFR calc non Af Amer 58 (*) >90 mL/min   GFR calc Af Amer 67 (*) >90 mL/min   Comment: (NOTE)     The eGFR has been calculated using the CKD EPI equation.     This calculation has not been validated in all clinical situations.     eGFR's persistently <90 mL/min signify possible Chronic Kidney     Disease.  LIPASE, BLOOD     Status: None   Collection Time    02/13/13  3:51 AM      Result Value Range   Lipase 29  11 - 59 U/L  POCT I-STAT TROPONIN I     Status: None   Collection Time    02/13/13  4:01 AM      Result Value Range   Troponin i, poc 0.01  0.00 - 0.08 ng/mL   Comment 3            Comment: Due to the release kinetics of cTnI,     a negative result within the first hours     of the onset of symptoms does not rule out     myocardial infarction with certainty.     If myocardial infarction is still suspected,     repeat the test at appropriate intervals.  CG4 I-STAT (LACTIC ACID)     Status: None   Collection Time    02/13/13  4:55 AM      Result Value Range   Lactic Acid, Venous 2.16  0.5 - 2.2 mmol/L  URINALYSIS, ROUTINE W  REFLEX MICROSCOPIC     Status: None   Collection Time    02/13/13  6:49 AM      Result Value Range   Color, Urine YELLOW  YELLOW   APPearance CLEAR  CLEAR   Specific Gravity, Urine 1.017  1.005 - 1.030   pH 6.5  5.0 - 8.0   Glucose, UA NEGATIVE  NEGATIVE mg/dL   Hgb urine dipstick NEGATIVE  NEGATIVE   Bilirubin Urine NEGATIVE  NEGATIVE   Ketones, ur NEGATIVE   NEGATIVE mg/dL   Protein, ur NEGATIVE  NEGATIVE mg/dL   Urobilinogen, UA 1.0  0.0 - 1.0 mg/dL   Nitrite NEGATIVE  NEGATIVE   Leukocytes, UA NEGATIVE  NEGATIVE   Comment: MICROSCOPIC NOT DONE ON URINES WITH NEGATIVE PROTEIN, BLOOD, LEUKOCYTES, NITRITE, OR GLUCOSE <1000 mg/dL.   Dg Abd 1 View  02/13/2013   CLINICAL DATA:  Mid abdominal pain with nausea and vomiting. Prior history small bowel resection.  EXAM: ABDOMEN - 1 VIEW  COMPARISON:  CT of the abdomen and pelvis November 18, 2011.  FINDINGS: The bowel gas pattern is nondilated and nonobstructive. Mild amount of ascending and transverse colonic retained large bowel stool. Surgical staples in the left midabdomen. No intraperitoneal free fluid, pathologic calcifications. Phleboliths in the pelvis. Soft tissue planes and included osseous structures are nonsuspicious.  IMPRESSION: Mild amount of retained large bowel stool, nonspecific bowel gas pattern.   Electronically Signed   By: Elon Alas   On: 02/13/2013 06:43   Ct Abdomen Pelvis W Contrast  02/13/2013   CLINICAL DATA:  Abdominal pain, nausea and vomiting beginning 02/12/2013.  EXAM: CT ABDOMEN AND PELVIS WITH CONTRAST  TECHNIQUE: Multidetector CT imaging of the abdomen and pelvis was performed using the standard protocol following bolus administration of intravenous contrast.  CONTRAST:  100 mL OMNIPAQUE IOHEXOL 300 MG/ML  SOLN  COMPARISON:  CT abdomen and pelvis 11/18/2011.  FINDINGS: There is cardiomegaly. No pleural or pericardial effusion. Imaged lung parenchyma demonstrates early dependent atelectasis.  There is a small amount of pericholecystic fluid with trace amount of perihepatic ascites also identified. The liver is mildly low attenuating consistent with fatty infiltration. No focal liver lesion is identified. The spleen, biliary tree, adrenal glands and pancreas are unremarkable. Small bilateral renal cysts are unchanged. The stomach appears normal. Small bowel anastomosis is  identified as on prior studies with minimal dilatation of the anastomosis likely due to atony. There is no small bowel obstruction. The colon and appendix appear normal. Small fat containing bilateral inguinal hernias are noted. The urinary bladder, prostate gland and seminal vesicles are unremarkable. Scattered aortoiliac atherosclerosis is noted. There is no lymphadenopathy. No focal bony abnormality.  IMPRESSION: Trace amount of perihepatic ascites with a small amount of pericholecystic fluid is nonspecific. Cause for the fluid is not identified. If there is concern for cholecystitis, limited right upper quadrant ultrasound could be used for further evaluation.  Cardiomegaly.  Mild appearing fatty infiltration of the liver.   Electronically Signed   By: Inge Rise M.D.   On: 02/13/2013 07:14   US Abdomen Limited Ruq  02/13/2013   CLINICAL DATA:  Increased liver function tests.  EXAM: US ABDOMEN LIMITED - RIGHT UPPER QUADRANT  COMPARISON:  None.  FINDINGS: Gallbladder  Sludge identified in gallbladder. There is a tiny amount of pericholecystic fluid. The gallbladder wall appears thickened at between 1.3 and 0.5 cm. Sonographer reports negative Murphy's sign.  Common bile duct  Diameter: 0.4 cm.  Liver:  The liver  demonstrates increased echogenicity consistent with fatty infiltration. No focal lesion or intrahepatic biliary ductal dilatation is identified. There is hepatopetal flow in the portal vein.  IMPRESSION: Gallbladder sludge with wall thickening and pericholecystic fluid but no Murphy's sign. Wall thickening is likely due to pericholecystic fluid but cholecystitis is also possible. HIDA scan could be used for further evaluation.  Fatty infiltration of the liver.   Electronically Signed   By: Inge Rise M.D.   On: 02/13/2013 08:24    Review of Systems  Constitutional: Negative.   HENT: Negative.   Eyes:       He has cataracts, to immature to remove.  Respiratory: Positive for shortness  of breath (with heavy exertion) and wheezing (with exertion).   Cardiovascular: Positive for palpitations (he is in chronic AF, he can feel HR go up at times, but none recently.) and leg swelling (occasional).  Gastrointestinal: Positive for heartburn, vomiting and abdominal pain (Pain mid epigastric and more in The RUQ). Negative for diarrhea, constipation, blood in stool and melena. Nausea: This AM with pain.  Musculoskeletal: Positive for back pain (OCCASIONAL BACK PAIN SINCE HE WAS AS TEEN).  Skin: Negative.        Plaque psoriasis in the [employer]   Neurological: Positive for dizziness (Occasionallly with stress he will get light headed).  Endo/Heme/Allergies: Negative for environmental allergies and polydipsia. Bruises/bleeds easily.  Psychiatric/Behavioral: Negative.     Blood pressure 102/69, pulse 83, temperature 98.9 F (37.2 C), temperature source Oral, resp. rate 13, height 5' 9"  (1.753 m), weight 88.905 kg (196 lb), SpO2 90.00%. Physical Exam  Constitutional: He is oriented to person, place, and time. He appears well-developed and well-nourished. No distress.  HENT:  Head: Normocephalic and atraumatic.  Nose: Nose normal.  Eyes: Conjunctivae and EOM are normal. Pupils are equal, round, and reactive to light. Right eye exhibits no discharge. Left eye exhibits no discharge. No scleral icterus.  Neck: Normal range of motion. Neck supple. No JVD present. No tracheal deviation present. No thyromegaly present.  Cardiovascular: Normal rate, regular rhythm, normal heart sounds and intact distal pulses.  Exam reveals no gallop.   No murmur heard. Respiratory: Effort normal and breath sounds normal. No respiratory distress. He has no wheezes. He has no rales. He exhibits no tenderness.  GI: Soft. Bowel sounds are normal. He exhibits no distension and no mass. There is tenderness (right now he is not tender, just had pain medicine, he complains of RUQ and mid epigastric area before.). There is  no rebound and no guarding.  Musculoskeletal: He exhibits no edema and no tenderness.  Lymphadenopathy:    He has no cervical adenopathy.  Neurological: He is alert and oriented to person, place, and time. No cranial nerve deficit.  Skin: Skin is warm and dry. No rash noted. He is not diaphoretic. No erythema. No pallor.  Psychiatric: He has a normal mood and affect. His behavior is normal. Judgment and thought content normal.     Assessment/Plan 1.  Acute cholecystitis with cholelithiasis 2.  Evelvated LFT's 3.  Afib/Flutter on Xarelto, last dose 1830 hr 02/13/12 4.  COPD/tobacco use/industrial exposure 5.  S/p Exploratory laparotomy with resection of perforated small bowel  diverticula with primary anastomosis, 10/23/12. 6.  Hyperlipidemia  Plan:  Admit to telemetry, hold Xarelto, heparin per pharmacy.  Clear liquids, repeat LFT's in AM.  If his LFT's rise, will ask GI to see.  IF not wait for Xarelto to wear off, and then plan cholecystectomy.  We  usually wait 5 days for this drug to wear off.  I have ask Medicine to see and follow with [place].   Ida Uppal 02/13/2013, 9:25 AM

## 2013-02-13 NOTE — ED Notes (Signed)
MD at bedside.  EDPA Surgical PA WIll here to evaluate for admision

## 2013-02-13 NOTE — H&P (Signed)
Seen and agree  

## 2013-02-13 NOTE — Progress Notes (Signed)
UR completed 

## 2013-02-13 NOTE — ED Provider Notes (Addendum)
CSN: [id number]     Arrival date & time 02/13/13  0330 History   First MD Initiated Contact with Patient 02/13/13 0400     Chief Complaint  Patient presents with  . Abdominal Pain  . Nausea  . Emesis   (Consider location/radiation/quality/duration/timing/severity/associated sxs/prior Treatment) HPI  This is a 73 year old male with a history of atrial fibrillation and perforated bowel requiring surgery and resection who presents with abdominal pain. Onset of pain was last night at 11 PM. Patient endorses nonbilious, nonbloody emesis. He states that the pain is in his upper abdomen and radiates to the upper abdominal quadrants. He describes it as burning. It has progressively gotten worse.  Patient currently states that the pain has subsided and that he is comfortable. He states that this pain is not as bad as when he was diagnosed with a perforated bowel. He reports that he has some relief of pain with vomiting. He took Beano and Rolaids without relief. Last normal bowel movement was yesterday at noon.  Past Medical History  Diagnosis Date  . Afib   . Hypercholesteremia   . Arthritis    Past Surgical History  Procedure Laterality Date  . Rotator cuff repair    . Laparotomy  10/24/2011    Procedure: EXPLORATORY LAPAROTOMY;  Surgeon: Pedro Earls, MD;  Location: [facility];  Service: General;  Laterality: N/A;  . Bowel resection  10/24/2011    Procedure: SMALL BOWEL RESECTION;  Surgeon: Pedro Earls, MD;  Location: [facility];  Service: General;;  for small bowel perforation    Family History  Problem Relation Age of Onset  . Pancreatic cancer Mother   . Pancreatic cancer Brother    History  Substance Use Topics  . Smoking status: Former Research scientist (life sciences)  . Smokeless tobacco: Not on file  . Alcohol Use: No    Review of Systems  Constitutional: Negative.  Negative for fever.  Respiratory: Negative.  Negative for chest tightness and shortness of breath.   Cardiovascular: Negative.  Negative  for chest pain.  Gastrointestinal: Positive for nausea, vomiting and abdominal pain. Negative for diarrhea and constipation.  Genitourinary: Negative.  Negative for dysuria.  Musculoskeletal: Negative for back pain.  Skin: Negative for rash.  Neurological: Negative for headaches.  All other systems reviewed and are negative.    Allergies  Codeine and Lactose intolerance (gi)  Home Medications   Current Outpatient Rx  Name  Route  Sig  Dispense  Refill  . metoprolol (LOPRESSOR) 50 MG tablet   Oral   Take 25 mg by mouth 2 (two) times daily.          . Rivaroxaban (XARELTO) 20 MG TABS tablet   Oral   Take 20 mg by mouth every evening.         . simvastatin (ZOCOR) 40 MG tablet   Oral   Take 40 mg by mouth every evening.         . Tamsulosin HCl (FLOMAX) 0.4 MG CAPS   Oral   Take 0.4 mg by mouth every evening.          BP 135/73  Pulse 87  Temp(Src) 98.3 F (36.8 C) (Oral)  Resp 18  Ht 5\' 9"  (1.753 m)  Wt 196 lb (88.905 kg)  BMI 28.93 kg/m2  SpO2 98% Physical Exam  Nursing note and vitals reviewed. Constitutional: He is oriented to person, place, and time. He appears well-developed and well-nourished. No distress.  HENT:  Head: Normocephalic and atraumatic.  Mouth/Throat: Oropharynx is clear and moist.  Eyes: Pupils are equal, round, and reactive to light.  Neck: Neck supple.  Cardiovascular: Normal rate, regular rhythm and normal heart sounds.   No murmur heard. Pulmonary/Chest: Effort normal and breath sounds normal. No respiratory distress. He has no wheezes.  Abdominal: Soft. Bowel sounds are normal. He exhibits no distension. There is no tenderness. There is no rebound.  Musculoskeletal: He exhibits no edema.  Lymphadenopathy:    He has no cervical adenopathy.  Neurological: He is alert and oriented to person, place, and time.  Skin: Skin is warm and dry.  Psychiatric: He has a normal mood and affect.    ED Course  Procedures (including  critical care time) Labs Review Labs Reviewed  CBC WITH DIFFERENTIAL - Abnormal; Notable for the following:    Neutrophils Relative % 86 (*)    Lymphocytes Relative 10 (*)    All other components within normal limits  COMPREHENSIVE METABOLIC PANEL - Abnormal; Notable for the following:    Glucose, Bld 157 (*)    AST 273 (*)    ALT 105 (*)    Total Bilirubin 2.4 (*)    GFR calc non Af Amer 58 (*)    GFR calc Af Amer 67 (*)    All other components within normal limits  LIPASE, BLOOD  URINALYSIS, ROUTINE W REFLEX MICROSCOPIC  POCT I-STAT TROPONIN I  CG4 I-STAT (LACTIC ACID)   Imaging Review Dg Abd 1 View  02/13/2013   CLINICAL DATA:  Mid abdominal pain with nausea and vomiting. Prior history small bowel resection.  EXAM: ABDOMEN - 1 VIEW  COMPARISON:  CT of the abdomen and pelvis November 18, 2011.  FINDINGS: The bowel gas pattern is nondilated and nonobstructive. Mild amount of ascending and transverse colonic retained large bowel stool. Surgical staples in the left midabdomen. No intraperitoneal free fluid, pathologic calcifications. Phleboliths in the pelvis. Soft tissue planes and included osseous structures are nonsuspicious.  IMPRESSION: Mild amount of retained large bowel stool, nonspecific bowel gas pattern.   Electronically Signed   By: Awilda Metro   On: 02/13/2013 06:43   Ct Abdomen Pelvis W Contrast  02/13/2013   CLINICAL DATA:  Abdominal pain, nausea and vomiting beginning 02/12/2013.  EXAM: CT ABDOMEN AND PELVIS WITH CONTRAST  TECHNIQUE: Multidetector CT imaging of the abdomen and pelvis was performed using the standard protocol following bolus administration of intravenous contrast.  CONTRAST:  100 mL OMNIPAQUE IOHEXOL 300 MG/ML  SOLN  COMPARISON:  CT abdomen and pelvis 11/18/2011.  FINDINGS: There is cardiomegaly. No pleural or pericardial effusion. Imaged lung parenchyma demonstrates early dependent atelectasis.  There is a small amount of pericholecystic fluid with trace  amount of perihepatic ascites also identified. The liver is mildly low attenuating consistent with fatty infiltration. No focal liver lesion is identified. The spleen, biliary tree, adrenal glands and pancreas are unremarkable. Small bilateral renal cysts are unchanged. The stomach appears normal. Small bowel anastomosis is identified as on prior studies with minimal dilatation of the anastomosis likely due to atony. There is no small bowel obstruction. The colon and appendix appear normal. Small fat containing bilateral inguinal hernias are noted. The urinary bladder, prostate gland and seminal vesicles are unremarkable. Scattered aortoiliac atherosclerosis is noted. There is no lymphadenopathy. No focal bony abnormality.  IMPRESSION: Trace amount of perihepatic ascites with a small amount of pericholecystic fluid is nonspecific. Cause for the fluid is not identified. If there is concern for cholecystitis, limited right upper quadrant  ultrasound could be used for further evaluation.  Cardiomegaly.  Mild appearing fatty infiltration of the liver.   Electronically Signed   By: Inge Rise M.D.   On: 02/13/2013 07:14   US Abdomen Limited Ruq  02/13/2013   CLINICAL DATA:  Increased liver function tests.  EXAM: US ABDOMEN LIMITED - RIGHT UPPER QUADRANT  COMPARISON:  None.  FINDINGS: Gallbladder  Sludge identified in gallbladder. There is a tiny amount of pericholecystic fluid. The gallbladder wall appears thickened at between 1.3 and 0.5 cm. Sonographer reports negative Murphy's sign.  Common bile duct  Diameter: 0.4 cm.  Liver:  The liver demonstrates increased echogenicity consistent with fatty infiltration. No focal lesion or intrahepatic biliary ductal dilatation is identified. There is hepatopetal flow in the portal vein.  IMPRESSION: Gallbladder sludge with wall thickening and pericholecystic fluid but no Murphy's sign. Wall thickening is likely due to pericholecystic fluid but cholecystitis is also  possible. HIDA scan could be used for further evaluation.  Fatty infiltration of the liver.   Electronically Signed   By: Inge Rise M.D.   On: 02/13/2013 08:24    EKG Interpretation    Date/Time:  Wednesday February 13 2013 03:57:44 EST Ventricular Rate:  88 PR Interval:    QRS Duration: 144 QT Interval:  374 QTC Calculation: 452 R Axis:   2 Text Interpretation:  Atrial fibrillation Left bundle branch block No significant change since last tracing Confirmed by HORTON  MD, COURTNEY ([id number]) on 02/13/2013 4:01:36 AM            MDM  No diagnosis found.   Patient presents with abdominal pain and vomiting.  INitial exam with a benign abdomen and VS stable.  Labwork obtained and KUB without evidence of free air.  Labwork notable for increased LFTs.  CT scan abdomen with new ascites and pericholecystic fluid.  Will obtain US.  Patient given pain medication.  ANd on reexamination, abdomen remains soft without signs of peritonitis but is more distended.  Patient signed out to Dr. Wilson Singer with [place] pending.    Merryl Hacker, MD 02/13/13 1950  Merryl Hacker, MD 02/13/13 1806

## 2013-02-13 NOTE — ED Notes (Signed)
Pt from home c/o abdominal pain since last p.m. At about 11p. accompanied by nausea and vomiting. Pt has HX of perforated Bowel. Denies blood in stool.

## 2013-02-13 NOTE — Progress Notes (Signed)
ANTIBIOTIC CONSULT NOTE - INITIAL  Pharmacy Consult for Zosyn Indication: cholecystitis  Allergies  Allergen Reactions  . Codeine Nausea Only  . Lactose Intolerance (Gi) Other (See Comments)    Gas pain   Patient Measurements: Height: 5\' 9"  (175.3 cm) Weight: 196 lb (88.905 kg) IBW/kg (Calculated) : 70.7  Vital Signs: Temp: 98.9 F (37.2 C) (01/07 0915) Temp src: Oral (01/07 0915) BP: 120/60 mmHg (01/07 0927) Pulse Rate: 72 (01/07 0927) Intake/Output from previous day:   Intake/Output from this shift:    Labs:  Recent Labs  02/13/13 0351  WBC 8.9  HGB 15.3  PLT 164  CREATININE 1.22   Estimated Creatinine Clearance: 61.3 ml/min (by C-G formula based on Cr of 1.22). No results found for this basename: VANCOTROUGH, VANCOPEAK, VANCORANDOM, GENTTROUGH, GENTPEAK, GENTRANDOM, TOBRATROUGH, TOBRAPEAK, TOBRARND, AMIKACINPEAK, AMIKACINTROU, AMIKACIN,  in the last 72 hours   Microbiology: No results found for this or any previous visit (from the past 720 hour(s)).  Medical History: Past Medical History  Diagnosis Date  . Afib   . Hypercholesteremia   . Arthritis    Medications:  Scheduled:  . metoprolol  25 mg Oral BID  . simvastatin  40 mg Oral QPM  . tamsulosin  0.4 mg Oral QPM   Anti-infectives   Start     Dose/Rate Route Frequency Ordered Stop   02/13/13 1800  piperacillin-tazobactam (ZOSYN) IVPB 3.375 g     3.375 g 12.5 mL/hr over 240 Minutes Intravenous Every 8 hours 02/13/13 1003     02/13/13 0900  piperacillin-tazobactam (ZOSYN) IVPB 3.375 g     3.375 g 100 mL/hr over 30 Minutes Intravenous  Once 02/13/13 0851 02/13/13 0959     Assessment: 89 yoM with cholecystitis - planned surgery. Renal function normal, no antibiotic allergies.  Zosyn per pharmacy  Goal of Therapy:  Drug, dose and schedule appropriate for renal function, source of infection  Plan:   Zosyn 3.375gm q8hr- 4 hr infusion appropriate as ordered  Pharmacy will follow renal  function-adjust dose as necessary, no further notes necessary  Minda Ditto PharmD Pager [phone] 02/13/2013, 10:37 AM

## 2013-02-13 NOTE — Consult Note (Signed)
[place] Consultation  NASSIR NEIDERT GNF:[id number] DOB: Sep 01, 1940 DOA: 02/13/2013 PCP: Vena Austria, MD   Requesting physician: Surgery, Dr. Marlou Starks Date of consultation: 02/13/2013 Reason for consultation: medical co-morbidities including atrial fibrillation  Impression/Recommendations Active Problems:   Cholelithiasis with acute cholecystitis   HPI:  73 year old male with past medical history of atrial fibrillation on xarelto, dyslipidemia, hypertension who presented to [facility] ED 02/13/2013 with ongoing epigastric abdominal pain, nausea, and vomiting which started shortly after eating sausage and hamburger. Pain was rated 10/10 in intensity but with pain meds given in ED his pain has been significantly improved. CBC revealed normal WBC count. LFT's revealed AST of 273, ALT of 105 and bilirubin of 2.4. Abdominal x ray showed large bowel stool. CT abdomen revealed trace amount of perihepatic ascites with a small amount of pericholecystic fluid possible acute cholecystitis. US abdomen revealed gallbladder sludge with wall thickening and pericholecystic fluid likely due to pericholecystic fluid and possible cholecystitis.  Assessment and Plan:  Principal Problem: Acute cholecystitis - management per surgery - off of xarelto in anticipation of cholecystectomy - on heparin  Active problems: Atrial fibrillation - off of xarelto for surgery in few days - rate controlled with metoprolol 25 mg PO BID - on heparin per pharmacy BPH - continue flomax Dyslipidemia - continue statin therapy Hypertension - metoprolol 25 mg BID - will follow again tomorrow  Leisa Lenz [facility] Pager [phone]  If 7PM-7AM, please contact night-coverage [email or web address] Password [facility] 02/13/2013, 6:04 PM   Review of Systems:  Constitutional: Negative for fever, chills, diaphoresis, activity change, appetite change and fatigue.  HENT: Negative for ear pain, nosebleeds, congestion,  facial swelling, rhinorrhea, neck pain, neck stiffness and ear discharge.   Eyes: Negative for pain, discharge, redness, itching and visual disturbance.  Respiratory: Negative for cough, choking, chest tightness, shortness of breath, wheezing and stridor.   Cardiovascular: Negative for chest pain, palpitations and leg swelling.  Gastrointestinal: Negative for abdominal distention.  Genitourinary: Negative for dysuria, urgency, frequency, hematuria, flank pain, decreased urine volume, difficulty urinating and dyspareunia.  Musculoskeletal: Negative for back pain, joint swelling, arthralgias and gait problem.  Neurological: Negative for dizziness, tremors, seizures, syncope, facial asymmetry, speech difficulty, weakness, light-headedness, numbness and headaches.  Hematological: Negative for adenopathy. Does not bruise/bleed easily.  Psychiatric/Behavioral: Negative for hallucinations, behavioral problems, confusion, dysphoric mood, decreased concentration and agitation.     Past Medical History  Diagnosis Date  . Afib   . Hypercholesteremia   . Arthritis    Past Surgical History  Procedure Laterality Date  . Rotator cuff repair    . Laparotomy  10/24/2011    Procedure: EXPLORATORY LAPAROTOMY;  Surgeon: Pedro Earls, MD;  Location: [facility] ORS;  Service: General;  Laterality: N/A;  . Bowel resection  10/24/2011    Procedure: SMALL BOWEL RESECTION;  Surgeon: Pedro Earls, MD;  Location: [facility] ORS;  Service: General;;  for small bowel perforation    Social History:  reports that he has quit smoking. He has never used smokeless tobacco. He reports that he does not drink alcohol or use illicit drugs.  Allergies  Allergen Reactions  . Codeine Nausea Only  . Lactose Intolerance (Gi) Other (See Comments)    Gas pain   Family History  Problem Relation Age of Onset  . Pancreatic cancer Mother   . Pancreatic cancer Brother     Prior to Admission medications   Medication Sig Start Date End  Date Taking? Authorizing Provider  metoprolol (LOPRESSOR)  50 MG tablet Take 25 mg by mouth 2 (two) times daily.    Yes Historical Provider, MD  Rivaroxaban (XARELTO) 20 MG TABS tablet Take 20 mg by mouth every evening.   Yes Historical Provider, MD  simvastatin (ZOCOR) 40 MG tablet Take 40 mg by mouth every evening.   Yes Historical Provider, MD  Tamsulosin HCl (FLOMAX) 0.4 MG CAPS Take 0.4 mg by mouth every evening.   Yes Historical Provider, MD   Physical Exam: Blood pressure 117/70, pulse 88, temperature 98.9 F (37.2 C), temperature source Oral, resp. rate 16, height 5\' 9"  (1.753 m), weight 87.9 kg (193 lb 12.6 oz), SpO2 95.00%. Filed Vitals:   02/13/13 1247  BP: 117/70  Pulse: 88  Temp: 98.9 F (37.2 C)  Resp: 16   Physical Exam  Constitutional: Appears well-developed and well-nourished. No distress.  HENT: Normocephalic. External right and left ear normal. Oropharynx is clear and moist.  Eyes: Conjunctivae and EOM are normal. PERRLA, no scleral icterus.  Neck: Normal ROM. Neck supple. No JVD. No tracheal deviation. No thyromegaly.  CVS: RRR, S1/S2 +, no murmurs, no gallops, no carotid bruit.  Pulmonary: Effort and breath sounds normal, no stridor, rhonchi, wheezes, rales.  Abdominal: Soft. BS +,  no distension, tenderness in RUQ, no rebound or guarding.  Musculoskeletal: Normal range of motion. No edema and no tenderness.  Lymphadenopathy: No lymphadenopathy noted, cervical, inguinal. Neuro: Alert. Normal reflexes, muscle tone coordination. No cranial nerve deficit. Skin: Skin is warm and dry. No rash noted. Not diaphoretic. No erythema. No pallor.  Psychiatric: Normal mood and affect. Behavior, judgment, thought content normal.     Labs on Admission:  Basic Metabolic Panel:  Recent Labs Lab 02/13/13 0351  NA 140  K 4.5  CL 101  CO2 26  GLUCOSE 157*  BUN 20  CREATININE 1.22  CALCIUM 9.5   Liver Function Tests:  Recent Labs Lab 02/13/13 0351  AST 273*  ALT  105*  ALKPHOS 78  BILITOT 2.4*  PROT 7.3  ALBUMIN 3.8    Recent Labs Lab 02/13/13 0351  LIPASE 29   No results found for this basename: AMMONIA,  in the last 168 hours CBC:  Recent Labs Lab 02/13/13 0351  WBC 8.9  NEUTROABS 7.6  HGB 15.3  HCT 44.2  MCV 88.2  PLT 164   Cardiac Enzymes: No results found for this basename: CKTOTAL, CKMB, CKMBINDEX, TROPONINI,  in the last 168 hours BNP: No components found with this basename: POCBNP,  CBG: No results found for this basename: GLUCAP,  in the last 168 hours  Radiological Exams on Admission: Dg Abd 1 View  02/13/2013   CLINICAL DATA:  Mid abdominal pain with nausea and vomiting. Prior history small bowel resection.  EXAM: ABDOMEN - 1 VIEW  COMPARISON:  CT of the abdomen and pelvis November 18, 2011.  FINDINGS: The bowel gas pattern is nondilated and nonobstructive. Mild amount of ascending and transverse colonic retained large bowel stool. Surgical staples in the left midabdomen. No intraperitoneal free fluid, pathologic calcifications. Phleboliths in the pelvis. Soft tissue planes and included osseous structures are nonsuspicious.  IMPRESSION: Mild amount of retained large bowel stool, nonspecific bowel gas pattern.   Electronically Signed   By: Elon Alas   On: 02/13/2013 06:43   Ct Abdomen Pelvis W Contrast  02/13/2013   CLINICAL DATA:  Abdominal pain, nausea and vomiting beginning 02/12/2013.  EXAM: CT ABDOMEN AND PELVIS WITH CONTRAST  TECHNIQUE: Multidetector CT imaging of the abdomen and  pelvis was performed using the standard protocol following bolus administration of intravenous contrast.  CONTRAST:  100 mL OMNIPAQUE IOHEXOL 300 MG/ML  SOLN  COMPARISON:  CT abdomen and pelvis 11/18/2011.  FINDINGS: There is cardiomegaly. No pleural or pericardial effusion. Imaged lung parenchyma demonstrates early dependent atelectasis.  There is a small amount of pericholecystic fluid with trace amount of perihepatic ascites also  identified. The liver is mildly low attenuating consistent with fatty infiltration. No focal liver lesion is identified. The spleen, biliary tree, adrenal glands and pancreas are unremarkable. Small bilateral renal cysts are unchanged. The stomach appears normal. Small bowel anastomosis is identified as on prior studies with minimal dilatation of the anastomosis likely due to atony. There is no small bowel obstruction. The colon and appendix appear normal. Small fat containing bilateral inguinal hernias are noted. The urinary bladder, prostate gland and seminal vesicles are unremarkable. Scattered aortoiliac atherosclerosis is noted. There is no lymphadenopathy. No focal bony abnormality.  IMPRESSION: Trace amount of perihepatic ascites with a small amount of pericholecystic fluid is nonspecific. Cause for the fluid is not identified. If there is concern for cholecystitis, limited right upper quadrant ultrasound could be used for further evaluation.  Cardiomegaly.  Mild appearing fatty infiltration of the liver.   Electronically Signed   By: Inge Rise M.D.   On: 02/13/2013 07:14   US Abdomen Limited Ruq  02/13/2013   CLINICAL DATA:  Increased liver function tests.  EXAM: US ABDOMEN LIMITED - RIGHT UPPER QUADRANT  COMPARISON:  None.  FINDINGS: Gallbladder  Sludge identified in gallbladder. There is a tiny amount of pericholecystic fluid. The gallbladder wall appears thickened at between 1.3 and 0.5 cm. Sonographer reports negative Murphy's sign.  Common bile duct  Diameter: 0.4 cm.  Liver:  The liver demonstrates increased echogenicity consistent with fatty infiltration. No focal lesion or intrahepatic biliary ductal dilatation is identified. There is hepatopetal flow in the portal vein.  IMPRESSION: Gallbladder sludge with wall thickening and pericholecystic fluid but no Murphy's sign. Wall thickening is likely due to pericholecystic fluid but cholecystitis is also possible. HIDA scan could be used for  further evaluation.  Fatty infiltration of the liver.   Electronically Signed   By: Inge Rise M.D.   On: 02/13/2013 08:24    Time spent: 45 minutes

## 2013-02-13 NOTE — Progress Notes (Signed)
ANTICOAGULATION CONSULT NOTE - Initial Consult  Pharmacy Consult for Heparin Indication: Chronic Xarelto for Afib, Heparin for bridging with surgery  Allergies  Allergen Reactions  . Codeine Nausea Only  . Lactose Intolerance (Gi) Other (See Comments)    Gas pain   Patient Measurements: Height: 5\' 9"  (175.3 cm) Weight: 196 lb (88.905 kg) IBW/kg (Calculated) : 70.7 Heparin Dosing Weight: 90 kg  Vital Signs: Temp: 98.9 F (37.2 C) (01/07 0915) Temp src: Oral (01/07 0915) BP: 120/60 mmHg (01/07 0927) Pulse Rate: 72 (01/07 0927)  Labs:  Recent Labs  02/13/13 0351  HGB 15.3  HCT 44.2  PLT 164  CREATININE 1.22   Estimated Creatinine Clearance: 61.3 ml/min (by C-G formula based on Cr of 1.22).  Medical History: Past Medical History  Diagnosis Date  . Afib   . Hypercholesteremia   . Arthritis    Medications:  Scheduled:  . metoprolol  25 mg Oral BID  . simvastatin  40 mg Oral QPM  . tamsulosin  0.4 mg Oral QPM   Infusions:  . piperacillin-tazobactam (ZOSYN)  IV     Assessment: 41 yoM with cholecystitis - planned surgery. Hx of Afib on chronic Xarelto, home dose 20mg  daily with last dose 1/6 @ 1800. Heparin to begin 24 hr after last dose Xarelto.  Calculated Heparin rate = 1200- 1400 units/hr, no bolus  Renal function wnl  Goal of Therapy:  Heparin level 0.3-0.7 units/ml Monitor platelets by anticoagulation protocol: Yes   Plan:   Begin Heparin at 1200 units/hr at 1800  Daily CBC while on Heparin  Heparin level 6 hr after initiation, then daily  Monitor for signs/sx of bleed  Minda Ditto PharmD Pager [phone] 02/13/2013, 10:28 AM

## 2013-02-14 ENCOUNTER — Inpatient Hospital Stay (HOSPITAL_COMMUNITY): Payer: Medicare Other

## 2013-02-14 DIAGNOSIS — R945 Abnormal results of liver function studies: Secondary | ICD-10-CM

## 2013-02-14 DIAGNOSIS — D68318 Other hemorrhagic disorder due to intrinsic circulating anticoagulants, antibodies, or inhibitors: Secondary | ICD-10-CM

## 2013-02-14 DIAGNOSIS — I4891 Unspecified atrial fibrillation: Secondary | ICD-10-CM

## 2013-02-14 LAB — COMPREHENSIVE METABOLIC PANEL
ALBUMIN: 3.1 g/dL — AB (ref 3.5–5.2)
ALK PHOS: 86 U/L (ref 39–117)
ALT: 200 U/L — AB (ref 0–53)
AST: 221 U/L — ABNORMAL HIGH (ref 0–37)
BUN: 18 mg/dL (ref 6–23)
CO2: 27 mEq/L (ref 19–32)
Calcium: 8.6 mg/dL (ref 8.4–10.5)
Chloride: 97 mEq/L (ref 96–112)
Creatinine, Ser: 1.52 mg/dL — ABNORMAL HIGH (ref 0.50–1.35)
GFR calc Af Amer: 51 mL/min — ABNORMAL LOW (ref >90)
GFR calc non Af Amer: 44 mL/min — ABNORMAL LOW (ref >90)
Glucose, Bld: 129 mg/dL — ABNORMAL HIGH (ref 70–99)
POTASSIUM: 4.1 meq/L (ref 3.7–5.3)
Sodium: 135 mEq/L — ABNORMAL LOW (ref 137–147)
TOTAL PROTEIN: 6.4 g/dL (ref 6.0–8.3)
Total Bilirubin: 6.2 mg/dL — ABNORMAL HIGH (ref 0.3–1.2)

## 2013-02-14 LAB — LIPASE, BLOOD: Lipase: 15 U/L (ref 11–59)

## 2013-02-14 LAB — HEPARIN LEVEL (UNFRACTIONATED)
Heparin Unfractionated: 1.36 IU/mL — ABNORMAL HIGH (ref 0.30–0.70)
Heparin Unfractionated: 0.96 IU/mL — ABNORMAL HIGH (ref 0.30–0.70)
Heparin Unfractionated: 1.01 IU/mL — ABNORMAL HIGH (ref 0.30–0.70)

## 2013-02-14 LAB — APTT: aPTT: 89 seconds — ABNORMAL HIGH (ref 24–37)

## 2013-02-14 MED ORDER — HEPARIN (PORCINE) IN NACL 100-0.45 UNIT/ML-% IJ SOLN
700.0000 [IU]/h | INTRAMUSCULAR | Status: DC
  Administered 2013-02-15: 700 [IU]/h via INTRAVENOUS

## 2013-02-14 MED ORDER — HEPARIN (PORCINE) IN NACL 100-0.45 UNIT/ML-% IJ SOLN
900.0000 [IU]/h | INTRAMUSCULAR | Status: DC
  Administered 2013-02-14: 14:00:00 900 [IU]/h via INTRAVENOUS
  Filled 2013-02-14 (×2): qty 250

## 2013-02-14 MED ORDER — HEPARIN (PORCINE) IN NACL 100-0.45 UNIT/ML-% IJ SOLN
1000.0000 [IU]/h | INTRAMUSCULAR | Status: DC
  Filled 2013-02-14: qty 250

## 2013-02-14 MED ORDER — GADOBENATE DIMEGLUMINE 529 MG/ML IV SOLN
9.0000 mL | Freq: Once | INTRAVENOUS | Status: AC | PRN
  Administered 2013-02-14: 9 mL via INTRAVENOUS

## 2013-02-14 NOTE — Progress Notes (Signed)
PHARMACIST - PHYSICIAN COMMUNICATION CONCERNING:  IV Heparin  Please see pharmacist note earlier today for full details.  In brief, 12 yoM on chronic xarelto PTA for hx Afib, now changed to IV heparin for planned surgery.  Last dose xarelto 1/6 ~48 hours ago.  Heparin levels have been supratherapeutic likely from persistent effects from Xarelto.  SCr also rising.    HL tonight = 1.01.  RN to turn off heparin x 1 hour.  No bleeding or infusion issues noted.   Goal of Therapy:  Heparin level 0.3-0.7 units/ml  Monitor platelets by anticoagulation protocol: Yes  Plan: Hold heparin x 1 hour Resume at decreased infusion rate of 700 units/hr.   F/u HL in 8 hours after resuming infusion (0700 AM 1/9)  Ralene Bathe, PharmD, BCPS 02/14/2013, 9:40 PM  Pager: [phone]

## 2013-02-14 NOTE — Progress Notes (Signed)
ANTICOAGULATION CONSULT NOTE - F/U Consult  Pharmacy Consult for Heparin Indication: Chronic Xarelto for Afib, Heparin for bridging with surgery  Allergies  Allergen Reactions  . Codeine Nausea Only  . Lactose Intolerance (Gi) Other (See Comments)    Gas pain   Patient Measurements: Height: 5\' 9"  (175.3 cm) Weight: 193 lb 12.6 oz (87.9 kg) IBW/kg (Calculated) : 70.7 Heparin Dosing Weight: 90 kg  Vital Signs: Temp: 99.4 F (37.4 C) (01/07 2058) Temp src: Oral (01/07 2058) BP: 118/57 mmHg (01/07 2058) Pulse Rate: 60 (01/07 2058)  Labs:  Recent Labs  02/13/13 0351 02/14/13 0025  HGB 15.3  --   HCT 44.2  --   PLT 164  --   HEPARINUNFRC  --  1.36*  CREATININE 1.22  --    Estimated Creatinine Clearance: 61 ml/min (by C-G formula based on Cr of 1.22).  Medical History: Past Medical History  Diagnosis Date  . Afib   . Hypercholesteremia   . Arthritis    Medications:  Scheduled:  . metoprolol  25 mg Oral BID  . piperacillin-tazobactam (ZOSYN)  IV  3.375 g Intravenous Q8H  . simvastatin  40 mg Oral QPM  . tamsulosin  0.4 mg Oral QPM   Infusions:  . heparin     Assessment: 59 yoM with cholecystitis - planned surgery. Hx of Afib on chronic Xarelto, home dose 20mg  daily with last dose 1/6 @ 1800. Heparin to begin 24 hr after last dose Xarelto.  HL= 1.36   No bleeding/IV interuptions per RN  Suspect still seeing Xarelto effect!   Goal of Therapy:  Heparin level 0.3-0.7 units/ml Monitor platelets by anticoagulation protocol: Yes   Plan:   Decrease heparin drip to 1000 units/hr  Daily CBC while on Heparin  Recheck HL in 8 hours (hopefully Xarelto effect will be gone)  Monitor for signs/sx of bleed   Dorrene German 02/14/2013, 1:30 AM

## 2013-02-14 NOTE — Consult Note (Signed)
[facility] CONSULT Reason for consult: elevated bilirubin Referring Physician: Dr Harlow Asa. PCP: Dr Alyson Ingles. Cardiologist Dr. Suann Larry is an 73 y.o. male.  HPI: patient has chronic atrial fib never is in sinus rhythm. His chronically anticoagulant. He been doing well until 2 days ago when he had been eating fatty meal and began having pain and is epigastric and treated with an acids Rolaids etc. He came to the emergency room and CT of the abdomen show to fatty liver a small amount of ascitic fluid in no dilated ducts. Ultrasound of the abdomen showed sludge in the gallbladder and a thickened wall consistent with cholecystitis in no dilated ducts. The common bile duct was 4 mm diameter. The patient's initial labs field total bilirubin 2.4 with normal alk phos. Bilirubin up to 6.2 alk phos still normal. Transaminases about 4 times normal. Patient is having no pain. We're asked to see about possible ERCP. A cause of his AFib is on heparin drip.  Past Medical History  Diagnosis Date  . Afib   . Hypercholesteremia   . Arthritis     Past Surgical History  Procedure Laterality Date  . Rotator cuff repair    . Laparotomy  10/24/2011    Procedure: EXPLORATORY LAPAROTOMY;  Surgeon: Pedro Earls, MD;  Location: [facility];  Service: General;  Laterality: N/A;  . Bowel resection  10/24/2011    Procedure: SMALL BOWEL RESECTION;  Surgeon: Pedro Earls, MD;  Location: [facility];  Service: General;;  for small bowel perforation     Family History  Problem Relation Age of Onset  . Pancreatic cancer Mother   . Pancreatic cancer Brother     Social History:  reports that he has quit smoking. He has never used smokeless tobacco. He reports that he does not drink alcohol or use illicit drugs.  Allergies:  Allergies  Allergen Reactions  . Codeine Nausea Only  . Lactose Intolerance (Gi) Other (See Comments)    Gas pain    Medications; Prior to Admission medications   Medication  Sig Start Date End Date Taking? Authorizing Provider  metoprolol (LOPRESSOR) 50 MG tablet Take 25 mg by mouth 2 (two) times daily.    Yes Historical Provider, MD  Rivaroxaban (XARELTO) 20 MG TABS tablet Take 20 mg by mouth every evening.   Yes Historical Provider, MD  simvastatin (ZOCOR) 40 MG tablet Take 40 mg by mouth every evening.   Yes Historical Provider, MD  Tamsulosin HCl (FLOMAX) 0.4 MG CAPS Take 0.4 mg by mouth every evening.   Yes Historical Provider, MD   . metoprolol  25 mg Oral BID  . piperacillin-tazobactam (ZOSYN)  IV  3.375 g Intravenous Q8H  . simvastatin  40 mg Oral QPM  . tamsulosin  0.4 mg Oral QPM   PRN Meds acetaminophen, acetaminophen, diphenhydrAMINE, diphenhydrAMINE, HYDROmorphone (DILAUDID) injection, ondansetron, oxyCODONE-acetaminophen Results for orders placed during the hospital encounter of 02/13/13 (from the past 48 hour(s))  CBC WITH DIFFERENTIAL     Status: Abnormal   Collection Time    02/13/13  3:51 AM      Result Value Range   WBC 8.9  4.0 - 10.5 K/uL   RBC 5.01  4.22 - 5.81 MIL/uL   Hemoglobin 15.3  13.0 - 17.0 g/dL   HCT 44.2  39.0 - 52.0 %   MCV 88.2  78.0 - 100.0 fL   MCH 30.5  26.0 - 34.0 pg   MCHC 34.6  30.0 - 36.0 g/dL  RDW 13.3  11.5 - 15.5 %   Platelets 164  150 - 400 K/uL   Neutrophils Relative % 86 (*) 43 - 77 %   Neutro Abs 7.6  1.7 - 7.7 K/uL   Lymphocytes Relative 10 (*) 12 - 46 %   Lymphs Abs 0.9  0.7 - 4.0 K/uL   Monocytes Relative 4  3 - 12 %   Monocytes Absolute 0.4  0.1 - 1.0 K/uL   Eosinophils Relative 0  0 - 5 %   Eosinophils Absolute 0.0  0.0 - 0.7 K/uL   Basophils Relative 0  0 - 1 %   Basophils Absolute 0.0  0.0 - 0.1 K/uL  COMPREHENSIVE METABOLIC PANEL     Status: Abnormal   Collection Time    02/13/13  3:51 AM      Result Value Range   Sodium 140  137 - 147 mEq/L   Potassium 4.5  3.7 - 5.3 mEq/L   Chloride 101  96 - 112 mEq/L   CO2 26  19 - 32 mEq/L   Glucose, Bld 157 (*) 70 - 99 mg/dL   BUN 20  6 - 23  mg/dL   Creatinine, Ser 1.22  0.50 - 1.35 mg/dL   Calcium 9.5  8.4 - 10.5 mg/dL   Total Protein 7.3  6.0 - 8.3 g/dL   Albumin 3.8  3.5 - 5.2 g/dL   AST 273 (*) 0 - 37 U/L   ALT 105 (*) 0 - 53 U/L   Alkaline Phosphatase 78  39 - 117 U/L   Total Bilirubin 2.4 (*) 0.3 - 1.2 mg/dL   GFR calc non Af Amer 58 (*) >90 mL/min   GFR calc Af Amer 67 (*) >90 mL/min   Comment: (NOTE)     The eGFR has been calculated using the CKD EPI equation.     This calculation has not been validated in all clinical situations.     eGFR's persistently <90 mL/min signify possible Chronic Kidney     Disease.  LIPASE, BLOOD     Status: None   Collection Time    02/13/13  3:51 AM      Result Value Range   Lipase 29  11 - 59 U/L  POCT I-STAT TROPONIN I     Status: None   Collection Time    02/13/13  4:01 AM      Result Value Range   Troponin i, poc 0.01  0.00 - 0.08 ng/mL   Comment 3            Comment: Due to the release kinetics of cTnI,     a negative result within the first hours     of the onset of symptoms does not rule out     myocardial infarction with certainty.     If myocardial infarction is still suspected,     repeat the test at appropriate intervals.  CG4 I-STAT (LACTIC ACID)     Status: None   Collection Time    02/13/13  4:55 AM      Result Value Range   Lactic Acid, Venous 2.16  0.5 - 2.2 mmol/L  URINALYSIS, ROUTINE W REFLEX MICROSCOPIC     Status: None   Collection Time    02/13/13  6:49 AM      Result Value Range   Color, Urine YELLOW  YELLOW   APPearance CLEAR  CLEAR   Specific Gravity, Urine 1.017  1.005 - 1.030   pH 6.5  5.0 - 8.0   Glucose, UA NEGATIVE  NEGATIVE mg/dL   Hgb urine dipstick NEGATIVE  NEGATIVE   Bilirubin Urine NEGATIVE  NEGATIVE   Ketones, ur NEGATIVE  NEGATIVE mg/dL   Protein, ur NEGATIVE  NEGATIVE mg/dL   Urobilinogen, UA 1.0  0.0 - 1.0 mg/dL   Nitrite NEGATIVE  NEGATIVE   Leukocytes, UA NEGATIVE  NEGATIVE   Comment: MICROSCOPIC NOT DONE ON URINES WITH  NEGATIVE PROTEIN, BLOOD, LEUKOCYTES, NITRITE, OR GLUCOSE <1000 mg/dL.  HEPARIN LEVEL (UNFRACTIONATED)     Status: Abnormal   Collection Time    02/14/13 12:25 AM      Result Value Range   Heparin Unfractionated 1.36 (*) 0.30 - 0.70 IU/mL   Comment: RESULTS CONFIRMED BY MANUAL DILUTION                IF HEPARIN RESULTS ARE BELOW     EXPECTED VALUES, AND PATIENT     DOSAGE HAS BEEN CONFIRMED,     SUGGEST FOLLOW UP TESTING     OF ANTITHROMBIN III LEVELS.  COMPREHENSIVE METABOLIC PANEL     Status: Abnormal   Collection Time    02/14/13 12:25 AM      Result Value Range   Sodium 135 (*) 137 - 147 mEq/L   Potassium 4.1  3.7 - 5.3 mEq/L   Chloride 97  96 - 112 mEq/L   CO2 27  19 - 32 mEq/L   Glucose, Bld 129 (*) 70 - 99 mg/dL   BUN 18  6 - 23 mg/dL   Creatinine, Ser 1.52 (*) 0.50 - 1.35 mg/dL   Calcium 8.6  8.4 - 10.5 mg/dL   Total Protein 6.4  6.0 - 8.3 g/dL   Albumin 3.1 (*) 3.5 - 5.2 g/dL   AST 221 (*) 0 - 37 U/L   ALT 200 (*) 0 - 53 U/L   Alkaline Phosphatase 86  39 - 117 U/L   Total Bilirubin 6.2 (*) 0.3 - 1.2 mg/dL   GFR calc non Af Amer 44 (*) >90 mL/min   GFR calc Af Amer 51 (*) >90 mL/min   Comment: (NOTE)     The eGFR has been calculated using the CKD EPI equation.     This calculation has not been validated in all clinical situations.     eGFR's persistently <90 mL/min signify possible Chronic Kidney     Disease.  LIPASE, BLOOD     Status: None   Collection Time    02/14/13 12:25 AM      Result Value Range   Lipase 15  11 - 59 U/L  HEPARIN LEVEL (UNFRACTIONATED)     Status: Abnormal   Collection Time    02/14/13  9:31 AM      Result Value Range   Heparin Unfractionated 0.96 (*) 0.30 - 0.70 IU/mL   Comment: RESULTS CONFIRMED BY MANUAL DILUTION                IF HEPARIN RESULTS ARE BELOW     EXPECTED VALUES, AND PATIENT     DOSAGE HAS BEEN CONFIRMED,     SUGGEST FOLLOW UP TESTING     OF ANTITHROMBIN III LEVELS.  APTT     Status: Abnormal   Collection Time     02/14/13  9:31 AM      Result Value Range   aPTT 89 (*) 24 - 37 seconds   Comment:            IF BASELINE aPTT  IS ELEVATED,     SUGGEST PATIENT RISK ASSESSMENT     BE USED TO DETERMINE APPROPRIATE     ANTICOAGULANT THERAPY.    Dg Abd 1 View  02/13/2013   CLINICAL DATA:  Mid abdominal pain with nausea and vomiting. Prior history small bowel resection.  EXAM: ABDOMEN - 1 VIEW  COMPARISON:  CT of the abdomen and pelvis November 18, 2011.  FINDINGS: The bowel gas pattern is nondilated and nonobstructive. Mild amount of ascending and transverse colonic retained large bowel stool. Surgical staples in the left midabdomen. No intraperitoneal free fluid, pathologic calcifications. Phleboliths in the pelvis. Soft tissue planes and included osseous structures are nonsuspicious.  IMPRESSION: Mild amount of retained large bowel stool, nonspecific bowel gas pattern.   Electronically Signed   By: Elon Alas   On: 02/13/2013 06:43   Ct Abdomen Pelvis W Contrast  02/13/2013   CLINICAL DATA:  Abdominal pain, nausea and vomiting beginning 02/12/2013.  EXAM: CT ABDOMEN AND PELVIS WITH CONTRAST  TECHNIQUE: Multidetector CT imaging of the abdomen and pelvis was performed using the standard protocol following bolus administration of intravenous contrast.  CONTRAST:  100 mL OMNIPAQUE IOHEXOL 300 MG/ML  SOLN  COMPARISON:  CT abdomen and pelvis 11/18/2011.  FINDINGS: There is cardiomegaly. No pleural or pericardial effusion. Imaged lung parenchyma demonstrates early dependent atelectasis.  There is a small amount of pericholecystic fluid with trace amount of perihepatic ascites also identified. The liver is mildly low attenuating consistent with fatty infiltration. No focal liver lesion is identified. The spleen, biliary tree, adrenal glands and pancreas are unremarkable. Small bilateral renal cysts are unchanged. The stomach appears normal. Small bowel anastomosis is identified as on prior studies with minimal dilatation  of the anastomosis likely due to atony. There is no small bowel obstruction. The colon and appendix appear normal. Small fat containing bilateral inguinal hernias are noted. The urinary bladder, prostate gland and seminal vesicles are unremarkable. Scattered aortoiliac atherosclerosis is noted. There is no lymphadenopathy. No focal bony abnormality.  IMPRESSION: Trace amount of perihepatic ascites with a small amount of pericholecystic fluid is nonspecific. Cause for the fluid is not identified. If there is concern for cholecystitis, limited right upper quadrant ultrasound could be used for further evaluation.  Cardiomegaly.  Mild appearing fatty infiltration of the liver.   Electronically Signed   By: Inge Rise M.D.   On: 02/13/2013 07:14   US Abdomen Limited Ruq  02/13/2013   CLINICAL DATA:  Increased liver function tests.  EXAM: US ABDOMEN LIMITED - RIGHT UPPER QUADRANT  COMPARISON:  None.  FINDINGS: Gallbladder  Sludge identified in gallbladder. There is a tiny amount of pericholecystic fluid. The gallbladder wall appears thickened at between 1.3 and 0.5 cm. Sonographer reports negative Murphy's sign.  Common bile duct  Diameter: 0.4 cm.  Liver:  The liver demonstrates increased echogenicity consistent with fatty infiltration. No focal lesion or intrahepatic biliary ductal dilatation is identified. There is hepatopetal flow in the portal vein.  IMPRESSION: Gallbladder sludge with wall thickening and pericholecystic fluid but no Murphy's sign. Wall thickening is likely due to pericholecystic fluid but cholecystitis is also possible. HIDA scan could be used for further evaluation.  Fatty infiltration of the liver.   Electronically Signed   By: Inge Rise M.D.   On: 02/13/2013 08:24              Blood pressure 106/61, pulse 78, temperature 98.7 F (37.1 C), temperature source Oral, resp. rate 18, height 5'  9" (1.753 m), weight 87.9 kg (193 lb 12.6 oz), SpO2 96.00%.  Physical exam:    General-- pleasant white male no acute distress nonicteric Heart-- regular rate and rhythm without murmurs are gallops Lungs--lungs clear Abdomen-- none distended and generally soft with good bowel sounds. Minimally tender   Assessment: 1. Acute cholecystitis 2. Elevated bilirubin. Patient's CBD is normal in CT and ultrasound. I doubt if he has a stone in his bile duct particularly in view of a normal alk phos.  . Plan: we will go ahead and order MRCP to make decision about whether ERCP as needed based on those results. I have discussed both of these procedures with the patient.   Finlay Mills JR,Anala Whisenant L 02/14/2013, 1:49 PM

## 2013-02-14 NOTE — Progress Notes (Signed)
[facility] PROGRESS NOTE  Michael Mejia [id number] DOB: January 11, 1941 DOA: 02/13/2013 PCP: Vena Austria, MD  Assessment/Plan: Atrial fibrillation  - off of xarelto for surgery in few days  - rate controlled with metoprolol 25 mg PO BID  - on heparin per pharmacy   BPH  - continue flomax   Dyslipidemia  - continue statin therapy  -Stable  Hypertension  - metoprolol 25 mg BID  Relatively well controlled on this regimen  Code Status: full Family Communication: No family at bedside Disposition Plan: Per surgeons   Procedures:  Pending  Antibiotics:  Zosyn  HPI/Subjective: No new complaints to me.  Objective: Filed Vitals:   02/14/13 1426  BP: 113/70  Pulse: 68  Temp: 97.7 F (36.5 C)  Resp: 18    Intake/Output Summary (Last 24 hours) at 02/14/13 1929 Last data filed at 02/14/13 1500  Gross per 24 hour  Intake 1110.23 ml  Output      0 ml  Net 1110.23 ml   Filed Weights   02/13/13 0342 02/13/13 1247  Weight: 88.905 kg (196 lb) 87.9 kg (193 lb 12.6 oz)    Exam:   General:  Pt in NAD, Alert and awake  Cardiovascular: irregularly irregular, rate WNL, no murmurs  Respiratory: CTA BL, no wheezes  Abdomen: DN, soft  Musculoskeletal: no cyanosis or clubbing   Data Reviewed: Basic Metabolic Panel:  Recent Labs Lab 02/13/13 0351 02/14/13 0025  NA 140 135*  K 4.5 4.1  CL 101 97  CO2 26 27  GLUCOSE 157* 129*  BUN 20 18  CREATININE 1.22 1.52*  CALCIUM 9.5 8.6   Liver Function Tests:  Recent Labs Lab 02/13/13 0351 02/14/13 0025  AST 273* 221*  ALT 105* 200*  ALKPHOS 78 86  BILITOT 2.4* 6.2*  PROT 7.3 6.4  ALBUMIN 3.8 3.1*    Recent Labs Lab 02/13/13 0351 02/14/13 0025  LIPASE 29 15   No results found for this basename: AMMONIA,  in the last 168 hours CBC:  Recent Labs Lab 02/13/13 0351  WBC 8.9  NEUTROABS 7.6  HGB 15.3  HCT 44.2  MCV 88.2  PLT 164   Cardiac Enzymes: No results found for this  basename: CKTOTAL, CKMB, CKMBINDEX, TROPONINI,  in the last 168 hours BNP (last 3 results) No results found for this basename: PROBNP,  in the last 8760 hours CBG: No results found for this basename: GLUCAP,  in the last 168 hours  No results found for this or any previous visit (from the past 240 hour(s)).   Studies: Dg Eye Foreign Body  02/14/2013   CLINICAL DATA:  Metal working/exposure; clearance prior to MRI  EXAM: ORBITS FOR FOREIGN BODY - 2 VIEW  COMPARISON:  None.  FINDINGS: There is no evidence of metallic foreign body within the orbits. No significant bone abnormality identified.  IMPRESSION: No evidence of metallic foreign body within the orbits.   Electronically Signed   By: David  Martinique   On: 02/14/2013 15:43   Dg Abd 1 View  02/13/2013   CLINICAL DATA:  Mid abdominal pain with nausea and vomiting. Prior history small bowel resection.  EXAM: ABDOMEN - 1 VIEW  COMPARISON:  CT of the abdomen and pelvis November 18, 2011.  FINDINGS: The bowel gas pattern is nondilated and nonobstructive. Mild amount of ascending and transverse colonic retained large bowel stool. Surgical staples in the left midabdomen. No intraperitoneal free fluid, pathologic calcifications. Phleboliths in the pelvis. Soft tissue planes and included osseous structures  are nonsuspicious.  IMPRESSION: Mild amount of retained large bowel stool, nonspecific bowel gas pattern.   Electronically Signed   By: Elon Alas   On: 02/13/2013 06:43   Ct Abdomen Pelvis W Contrast  02/13/2013   CLINICAL DATA:  Abdominal pain, nausea and vomiting beginning 02/12/2013.  EXAM: CT ABDOMEN AND PELVIS WITH CONTRAST  TECHNIQUE: Multidetector CT imaging of the abdomen and pelvis was performed using the standard protocol following bolus administration of intravenous contrast.  CONTRAST:  100 mL OMNIPAQUE IOHEXOL 300 MG/ML  SOLN  COMPARISON:  CT abdomen and pelvis 11/18/2011.  FINDINGS: There is cardiomegaly. No pleural or pericardial effusion.  Imaged lung parenchyma demonstrates early dependent atelectasis.  There is a small amount of pericholecystic fluid with trace amount of perihepatic ascites also identified. The liver is mildly low attenuating consistent with fatty infiltration. No focal liver lesion is identified. The spleen, biliary tree, adrenal glands and pancreas are unremarkable. Small bilateral renal cysts are unchanged. The stomach appears normal. Small bowel anastomosis is identified as on prior studies with minimal dilatation of the anastomosis likely due to atony. There is no small bowel obstruction. The colon and appendix appear normal. Small fat containing bilateral inguinal hernias are noted. The urinary bladder, prostate gland and seminal vesicles are unremarkable. Scattered aortoiliac atherosclerosis is noted. There is no lymphadenopathy. No focal bony abnormality.  IMPRESSION: Trace amount of perihepatic ascites with a small amount of pericholecystic fluid is nonspecific. Cause for the fluid is not identified. If there is concern for cholecystitis, limited right upper quadrant ultrasound could be used for further evaluation.  Cardiomegaly.  Mild appearing fatty infiltration of the liver.   Electronically Signed   By: Inge Rise M.D.   On: 02/13/2013 07:14   Mr 3d Recon At Scanner  02/14/2013   CLINICAL DATA:  Abdominal pain with elevated liver function studies. Possible cholecystitis. Evaluate for choledocholithiasis.  EXAM: MRI ABDOMEN WITHOUT AND WITH CONTRAST (INCLUDING MRCP)  TECHNIQUE: Multiplanar multisequence MR imaging of the abdomen was performed both before and after the administration of intravenous contrast. Heavily T2-weighted images of the biliary and pancreatic ducts were obtained, and three-dimensional MRCP images were rendered by post processing.  CONTRAST:  14mL MULTIHANCE GADOBENATE DIMEGLUMINE 529 MG/ML IV SOLN  COMPARISON:  CT ABD/PELVIS W CM dated 02/13/2013; CT ABD/PELVIS W CM dated 11/18/2011; US ABDOMEN  LIMITED RUQ/ASCITES dated 02/13/2013  FINDINGS: The gallbladder has a stable appearance with mild wall thickening and pericholecystic fluid. No gallstones are identified. The common bile duct measures 7 mm in diameter. There is no intrahepatic biliary dilatation. Best seen on the thin section MRCP images are two probable small dependent filling defects in the distal common bile duct, suspicious for choledocholithiasis.  There is no pancreatic ductal dilatation or evidence of pancreas divisum. The pancreas is mildly atrophied without focal mass or abnormal enhancement. The liver appears unremarkable.  The spleen and adrenal glands appear normal. Bilateral renal cysts are stable. 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 Small lymph nodes within the upper abdomen appear unchanged.  IMPRESSION: 1. There is suspicion of small intraductal calculi in the distal common bile duct consistent with choledocholithiasis. There is no significant biliary dilatation. 2. Persistent mild gallbladder wall thickening and pericholecystic fluid, nonspecific. Recent ultrasound demonstrated no sonographic Murphy's sign. 3. No evidence of pancreatitis or pancreatic ductal dilatation. 4. Stable bilateral renal cysts.   Electronically Signed   By: Camie Patience M.D.   On: 02/14/2013 17:10   Mr Abd W/wo Cm/mrcp  02/14/2013  CLINICAL DATA:  Abdominal pain with elevated liver function studies. Possible cholecystitis. Evaluate for choledocholithiasis.  EXAM: MRI ABDOMEN WITHOUT AND WITH CONTRAST (INCLUDING MRCP)  TECHNIQUE: Multiplanar multisequence MR imaging of the abdomen was performed both before and after the administration of intravenous contrast. Heavily T2-weighted images of the biliary and pancreatic ducts were obtained, and three-dimensional MRCP images were rendered by post processing.  CONTRAST:  19mL MULTIHANCE GADOBENATE DIMEGLUMINE 529 MG/ML IV SOLN  COMPARISON:  CT ABD/PELVIS W CM dated 02/13/2013; CT ABD/PELVIS W CM dated 11/18/2011;  US ABDOMEN LIMITED RUQ/ASCITES dated 02/13/2013  FINDINGS: The gallbladder has a stable appearance with mild wall thickening and pericholecystic fluid. No gallstones are identified. The common bile duct measures 7 mm in diameter. There is no intrahepatic biliary dilatation. Best seen on the thin section MRCP images are two probable small dependent filling defects in the distal common bile duct, suspicious for choledocholithiasis.  There is no pancreatic ductal dilatation or evidence of pancreas divisum. The pancreas is mildly atrophied without focal mass or abnormal enhancement. The liver appears unremarkable.  The spleen and adrenal glands appear normal. Bilateral renal cysts are stable. 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 Small lymph nodes within the upper abdomen appear unchanged.  IMPRESSION: 1. There is suspicion of small intraductal calculi in the distal common bile duct consistent with choledocholithiasis. There is no significant biliary dilatation. 2. Persistent mild gallbladder wall thickening and pericholecystic fluid, nonspecific. Recent ultrasound demonstrated no sonographic Murphy's sign. 3. No evidence of pancreatitis or pancreatic ductal dilatation. 4. Stable bilateral renal cysts.   Electronically Signed   By: Camie Patience M.D.   On: 02/14/2013 17:10   US Abdomen Limited Ruq  02/13/2013   CLINICAL DATA:  Increased liver function tests.  EXAM: US ABDOMEN LIMITED - RIGHT UPPER QUADRANT  COMPARISON:  None.  FINDINGS: Gallbladder  Sludge identified in gallbladder. There is a tiny amount of pericholecystic fluid. The gallbladder wall appears thickened at between 1.3 and 0.5 cm. Sonographer reports negative Murphy's sign.  Common bile duct  Diameter: 0.4 cm.  Liver:  The liver demonstrates increased echogenicity consistent with fatty infiltration. No focal lesion or intrahepatic biliary ductal dilatation is identified. There is hepatopetal flow in the portal vein.  IMPRESSION: Gallbladder sludge with  wall thickening and pericholecystic fluid but no Murphy's sign. Wall thickening is likely due to pericholecystic fluid but cholecystitis is also possible. HIDA scan could be used for further evaluation.  Fatty infiltration of the liver.   Electronically Signed   By: Inge Rise M.D.   On: 02/13/2013 08:24    Scheduled Meds: . metoprolol  25 mg Oral BID  . piperacillin-tazobactam (ZOSYN)  IV  3.375 g Intravenous Q8H  . simvastatin  40 mg Oral QPM  . tamsulosin  0.4 mg Oral QPM   Continuous Infusions: . heparin 900 Units/hr (02/14/13 1414)    Active Problems:   Atrial fibrillation, not on coumadin   Cholelithiasis with acute cholecystitis    Time spent: >20 minutes    Velvet Bathe  [facility] Pager [phone]. If 7PM-7AM, please contact night-coverage at [email or web address], password [facility] 02/14/2013, 7:29 PM  LOS: 1 day

## 2013-02-14 NOTE — Progress Notes (Signed)
ANTICOAGULATION CONSULT NOTE - F/U Consult  Pharmacy Consult for Heparin Indication: Chronic Xarelto for Afib, Heparin for bridging with surgery  Allergies  Allergen Reactions  . Codeine Nausea Only  . Lactose Intolerance (Gi) Other (See Comments)    Gas pain   Patient Measurements: Height: 5\' 9"  (175.3 cm) Weight: 193 lb 12.6 oz (87.9 kg) IBW/kg (Calculated) : 70.7 Heparin Dosing Weight: 90 kg  Vital Signs: Temp: 98.7 F (37.1 C) (01/08 0603) Temp src: Oral (01/08 0603) BP: 106/61 mmHg (01/08 0603) Pulse Rate: 78 (01/08 0603)  Labs:  Recent Labs  02/13/13 0351 02/14/13 0025 02/14/13 0931  HGB 15.3  --   --   HCT 44.2  --   --   PLT 164  --   --   APTT  --   --  89*  HEPARINUNFRC  --  1.36* 0.96*  CREATININE 1.22 1.52*  --    Estimated Creatinine Clearance: 48.9 ml/min (by C-G formula based on Cr of 1.52).  Medical History: Past Medical History  Diagnosis Date  . Afib   . Hypercholesteremia   . Arthritis    Medications:  Scheduled:  . metoprolol  25 mg Oral BID  . piperacillin-tazobactam (ZOSYN)  IV  3.375 g Intravenous Q8H  . simvastatin  40 mg Oral QPM  . tamsulosin  0.4 mg Oral QPM   Infusions:  . heparin 1,000 Units/hr (02/14/13 0146)   Assessment: 56 yoM with cholecystitis - planned surgery. Hx of Afib on chronic Xarelto, home dose 20mg  daily with last dose 1/6 @ 1800. Heparin to begin 24 hr after last dose Xarelto.  Heparin level remains slightly elevated (0.96) but decreased from earlier today (1.96 which is suspected that still seeing effects from [place]).  pTT of 89 reflects therapeutic anticoagulation and is less sensitive to effects of xarelto (range ~55-92 sec)  No bleeding/IV interuptions per RN  Goal of Therapy:  Heparin level 0.3-0.7 units/ml Monitor platelets by anticoagulation protocol: Yes   Plan:   Decrease heparin to 900 units/hr  Daily CBC while on Heparin  Recheck HL in 8 hours (expect Xarelto effect will be  gone)  Monitor for signs/sx of bleed   Peggyann Juba, PharmD, BCPS Pager: [phone] 02/14/2013, 10:53 AM

## 2013-02-14 NOTE — Progress Notes (Signed)
Patient ID: Michael Mejia, male   DOB: 1940/03/14, 73 y.o.   MRN: [id number]  General Surgery - [facility] - Progress Note  Subjective: Patient up in chair, alert, responsive.  No complaints.  Taking liquids without nausea.  Mild pain.  Objective: Vital signs in last 24 hours: Temp:  [97.8 F (36.6 C)-99.4 F (37.4 C)] 98.7 F (37.1 C) (01/08 0603) Pulse Rate:  [60-92] 78 (01/08 0603) Resp:  [16-21] 18 (01/08 0603) BP: (103-118)/(57-70) 106/61 mmHg (01/08 0603) SpO2:  [93 %-96 %] 96 % (01/08 0603) Weight:  [193 lb 12.6 oz (87.9 kg)] 193 lb 12.6 oz (87.9 kg) (01/07 1247) Last BM Date: 02/12/13  Intake/Output from previous day: 01/07 0701 - 01/08 0700 In: 1779.2 [P.O.:720; I.V.:1009.2; IV Piggyback:50] Out: 200 [Urine:200]  Exam: HEENT - clear, not icteric Neck - soft Chest - clear bilaterally Cor - RRR, no murmur Abd - soft, mild distension; BS present; no mass; minimal tenderness RUQ Ext - no significant edema Neuro - grossly intact, no focal deficits  Lab Results:   Recent Labs  02/13/13 0351  WBC 8.9  HGB 15.3  HCT 44.2  PLT 164     Recent Labs  02/13/13 0351 02/14/13 0025  NA 140 135*  K 4.5 4.1  CL 101 97  CO2 26 27  GLUCOSE 157* 129*  BUN 20 18  CREATININE 1.22 1.52*  CALCIUM 9.5 8.6    Studies/Results: Dg Abd 1 View  02/13/2013   CLINICAL DATA:  Mid abdominal pain with nausea and vomiting. Prior history small bowel resection.  EXAM: ABDOMEN - 1 VIEW  COMPARISON:  CT of the abdomen and pelvis November 18, 2011.  FINDINGS: The bowel gas pattern is nondilated and nonobstructive. Mild amount of ascending and transverse colonic retained large bowel stool. Surgical staples in the left midabdomen. No intraperitoneal free fluid, pathologic calcifications. Phleboliths in the pelvis. Soft tissue planes and included osseous structures are nonsuspicious.  IMPRESSION: Mild amount of retained large bowel stool, nonspecific bowel gas pattern.    Electronically Signed   By: Elon Alas   On: 02/13/2013 06:43   Ct Abdomen Pelvis W Contrast  02/13/2013   CLINICAL DATA:  Abdominal pain, nausea and vomiting beginning 02/12/2013.  EXAM: CT ABDOMEN AND PELVIS WITH CONTRAST  TECHNIQUE: Multidetector CT imaging of the abdomen and pelvis was performed using the standard protocol following bolus administration of intravenous contrast.  CONTRAST:  100 mL OMNIPAQUE IOHEXOL 300 MG/ML  SOLN  COMPARISON:  CT abdomen and pelvis 11/18/2011.  FINDINGS: There is cardiomegaly. No pleural or pericardial effusion. Imaged lung parenchyma demonstrates early dependent atelectasis.  There is a small amount of pericholecystic fluid with trace amount of perihepatic ascites also identified. The liver is mildly low attenuating consistent with fatty infiltration. No focal liver lesion is identified. The spleen, biliary tree, adrenal glands and pancreas are unremarkable. Small bilateral renal cysts are unchanged. The stomach appears normal. Small bowel anastomosis is identified as on prior studies with minimal dilatation of the anastomosis likely due to atony. There is no small bowel obstruction. The colon and appendix appear normal. Small fat containing bilateral inguinal hernias are noted. The urinary bladder, prostate gland and seminal vesicles are unremarkable. Scattered aortoiliac atherosclerosis is noted. There is no lymphadenopathy. No focal bony abnormality.  IMPRESSION: Trace amount of perihepatic ascites with a small amount of pericholecystic fluid is nonspecific. Cause for the fluid is not identified. If there is concern for cholecystitis, limited right upper quadrant ultrasound  could be used for further evaluation.  Cardiomegaly.  Mild appearing fatty infiltration of the liver.   Electronically Signed   By: Inge Rise M.D.   On: 02/13/2013 07:14   US Abdomen Limited Ruq  02/13/2013   CLINICAL DATA:  Increased liver function tests.  EXAM: US ABDOMEN LIMITED -  RIGHT UPPER QUADRANT  COMPARISON:  None.  FINDINGS: Gallbladder  Sludge identified in gallbladder. There is a tiny amount of pericholecystic fluid. The gallbladder wall appears thickened at between 1.3 and 0.5 cm. Sonographer reports negative Murphy's sign.  Common bile duct  Diameter: 0.4 cm.  Liver:  The liver demonstrates increased echogenicity consistent with fatty infiltration. No focal lesion or intrahepatic biliary ductal dilatation is identified. There is hepatopetal flow in the portal vein.  IMPRESSION: Gallbladder sludge with wall thickening and pericholecystic fluid but no Murphy's sign. Wall thickening is likely due to pericholecystic fluid but cholecystitis is also possible. HIDA scan could be used for further evaluation.  Fatty infiltration of the liver.   Electronically Signed   By: Inge Rise M.D.   On: 02/13/2013 08:24    Assessment / Plan: 1.  Acute cholecystitis with sludge / small stones  IV Zosyn  LFT's elevated - Tbili now 6.2, transaminases elevated  Will request GI consultation - possible ERCP versus MRCP if LFT's remain elevated  On heparin - holding Xarelto  Appreciate medical service assistance  Timing of cholecystectomy to be determined by Dr. Frederico Hamman, MD, [facility] Office: [phone]  02/14/2013

## 2013-02-15 ENCOUNTER — Encounter (HOSPITAL_COMMUNITY): Admission: EM | Disposition: A | Discharge status: Home Health | Payer: Medicare Other | Source: Home / Self Care

## 2013-02-15 ENCOUNTER — Encounter (HOSPITAL_COMMUNITY): Payer: Self-pay | Admitting: Gastroenterology

## 2013-02-15 ENCOUNTER — Inpatient Hospital Stay (HOSPITAL_COMMUNITY): Payer: Medicare Other

## 2013-02-15 HISTORY — PX: ERCP: SHX5425

## 2013-02-15 LAB — CBC
HCT: 38.7 % — ABNORMAL LOW (ref 39.0–52.0)
Hemoglobin: 13.1 g/dL (ref 13.0–17.0)
MCH: 29.8 pg (ref 26.0–34.0)
MCHC: 33.9 g/dL (ref 30.0–36.0)
MCV: 88.2 fL (ref 78.0–100.0)
PLATELETS: 134 10*3/uL — AB (ref 150–400)
RBC: 4.39 MIL/uL (ref 4.22–5.81)
RDW: 13.8 % (ref 11.5–15.5)
WBC: 3.9 10*3/uL — AB (ref 4.0–10.5)

## 2013-02-15 LAB — COMPREHENSIVE METABOLIC PANEL
ALBUMIN: 3.1 g/dL — AB (ref 3.5–5.2)
ALT: 119 U/L — AB (ref 0–53)
AST: 83 U/L — ABNORMAL HIGH (ref 0–37)
Alkaline Phosphatase: 117 U/L (ref 39–117)
BUN: 15 mg/dL (ref 6–23)
CHLORIDE: 100 meq/L (ref 96–112)
CO2: 24 mEq/L (ref 19–32)
Calcium: 8.8 mg/dL (ref 8.4–10.5)
Creatinine, Ser: 1.42 mg/dL — ABNORMAL HIGH (ref 0.50–1.35)
GFR calc Af Amer: 55 mL/min — ABNORMAL LOW (ref >90)
GFR calc non Af Amer: 48 mL/min — ABNORMAL LOW (ref >90)
Glucose, Bld: 142 mg/dL — ABNORMAL HIGH (ref 70–99)
POTASSIUM: 4.2 meq/L (ref 3.7–5.3)
SODIUM: 136 meq/L — AB (ref 137–147)
TOTAL PROTEIN: 6.8 g/dL (ref 6.0–8.3)
Total Bilirubin: 4.4 mg/dL — ABNORMAL HIGH (ref 0.3–1.2)

## 2013-02-15 LAB — HEPARIN LEVEL (UNFRACTIONATED): HEPARIN UNFRACTIONATED: 0.62 [IU]/mL (ref 0.30–0.70)

## 2013-02-15 LAB — TYPE AND SCREEN
ABO/RH(D): A NEG
Antibody Screen: NEGATIVE

## 2013-02-15 LAB — ABO/RH: ABO/RH(D): A NEG

## 2013-02-15 SURGERY — ERCP, WITH INTERVENTION IF INDICATED
Anesthesia: Moderate Sedation

## 2013-02-15 MED ORDER — BUTAMBEN-TETRACAINE-BENZOCAINE 2-2-14 % EX AERO
INHALATION_SPRAY | CUTANEOUS | Status: DC | PRN
  Administered 2013-02-15: 2 via TOPICAL

## 2013-02-15 MED ORDER — FENTANYL CITRATE 0.05 MG/ML IJ SOLN
INTRAMUSCULAR | Status: DC | PRN
  Administered 2013-02-15 (×3): 25 ug via INTRAVENOUS

## 2013-02-15 MED ORDER — SODIUM CHLORIDE 0.9 % IV SOLN
INTRAVENOUS | Status: DC | PRN
  Administered 2013-02-15: 16:00:00

## 2013-02-15 MED ORDER — HEPARIN (PORCINE) IN NACL 100-0.45 UNIT/ML-% IJ SOLN: 700.0000 [IU]/h | INTRAMUSCULAR | Status: DC

## 2013-02-15 MED ORDER — GLUCAGON HCL (RDNA) 1 MG IJ SOLR
INTRAMUSCULAR | Status: AC
  Filled 2013-02-15: qty 2

## 2013-02-15 MED ORDER — FENTANYL CITRATE 0.05 MG/ML IJ SOLN
INTRAMUSCULAR | Status: AC
  Filled 2013-02-15: qty 2

## 2013-02-15 MED ORDER — MIDAZOLAM HCL 10 MG/2ML IJ SOLN
INTRAMUSCULAR | Status: AC
  Filled 2013-02-15: qty 2

## 2013-02-15 MED ORDER — HEPARIN (PORCINE) IN NACL 100-0.45 UNIT/ML-% IJ SOLN
700.0000 [IU]/h | INTRAMUSCULAR | Status: DC
  Administered 2013-02-16: 700 [IU]/h via INTRAVENOUS
  Filled 2013-02-15: qty 250

## 2013-02-15 MED ORDER — SODIUM CHLORIDE 0.9 % IV SOLN: INTRAVENOUS | Status: DC

## 2013-02-15 MED ORDER — MIDAZOLAM HCL 10 MG/2ML IJ SOLN
INTRAMUSCULAR | Status: DC | PRN
  Administered 2013-02-15 (×3): 2 mg via INTRAVENOUS

## 2013-02-15 NOTE — Progress Notes (Signed)
[facility] PROGRESS NOTE Subjective MRCP shows ? Small CBD stones. No clear GSs but thickened GB. Pt with RUQ pain and nausea this am. Labs pending.  Objective: Vital signs in last 24 hours: Temp:  [97.7 F (36.5 C)-98.4 F (36.9 C)] 98.4 F (36.9 C) (01/09 0550) Pulse Rate:  [67-75] 75 (01/09 0550) Resp:  [12-20] 20 (01/09 0550) BP: (113-136)/(70-78) 136/72 mmHg (01/09 0550) SpO2:  [95 %-96 %] 95 % (01/09 0550) Last BM Date: 02/12/13  Intake/Output from previous day: 01/08 0701 - 01/09 0700 In: 1564.7 [P.O.:960; I.V.:404.7; IV Piggyback:200] Out: -  Intake/Output this shift: Total I/O In: 240 [P.O.:240] Out: -   PE: General--pt retching in basin Heart--ok Lungs--clear Abdomen--few BSs, mild tender in the RUQ  Lab Results:  Recent Labs  02/13/13 0351 02/15/13 0514  WBC 8.9 3.9*  HGB 15.3 13.1  HCT 44.2 38.7*  PLT 164 134*   BMET  Recent Labs  02/13/13 0351 02/14/13 0025 02/15/13 0811  NA 140 135* 136*  K 4.5 4.1 4.2  CL 101 97 100  CO2 26 27 24   CREATININE 1.22 1.52* 1.42*   LFT  Recent Labs  02/13/13 0351 02/14/13 0025 02/15/13 0811  PROT 7.3 6.4 6.8  AST 273* 221* 83*  ALT 105* 200* 119*  ALKPHOS 78 86 117  BILITOT 2.4* 6.2* 4.4*   PT/INR No results found for this basename: LABPROT, INR,  in the last 72 hours PANCREAS  Recent Labs  02/13/13 0351 02/14/13 0025  LIPASE 29 15         Studies/Results: Dg Eye Foreign Body  02/14/2013   CLINICAL DATA:  Metal working/exposure; clearance prior to MRI  EXAM: ORBITS FOR FOREIGN BODY - 2 VIEW  COMPARISON:  None.  FINDINGS: There is no evidence of metallic foreign body within the orbits. No significant bone abnormality identified.  IMPRESSION: No evidence of metallic foreign body within the orbits.   Electronically Signed   By: David  Martinique   On: 02/14/2013 15:43   Mr 3d Recon At Scanner  02/14/2013   CLINICAL DATA:  Abdominal pain with elevated liver function studies. Possible  cholecystitis. Evaluate for choledocholithiasis.  EXAM: MRI ABDOMEN WITHOUT AND WITH CONTRAST (INCLUDING MRCP)  TECHNIQUE: Multiplanar multisequence MR imaging of the abdomen was performed both before and after the administration of intravenous contrast. Heavily T2-weighted images of the biliary and pancreatic ducts were obtained, and three-dimensional MRCP images were rendered by post processing.  CONTRAST:  41mL MULTIHANCE GADOBENATE DIMEGLUMINE 529 MG/ML IV SOLN  COMPARISON:  CT ABD/PELVIS W CM dated 02/13/2013; CT ABD/PELVIS W CM dated 11/18/2011; US ABDOMEN LIMITED RUQ/ASCITES dated 02/13/2013  FINDINGS: The gallbladder has a stable appearance with mild wall thickening and pericholecystic fluid. No gallstones are identified. The common bile duct measures 7 mm in diameter. There is no intrahepatic biliary dilatation. Best seen on the thin section MRCP images are two probable small dependent filling defects in the distal common bile duct, suspicious for choledocholithiasis.  There is no pancreatic ductal dilatation or evidence of pancreas divisum. The pancreas is mildly atrophied without focal mass or abnormal enhancement. The liver appears unremarkable.  The spleen and adrenal glands appear normal. Bilateral renal cysts are stable. 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 Small lymph nodes within the upper abdomen appear unchanged.  IMPRESSION: 1. There is suspicion of small intraductal calculi in the distal common bile duct consistent with choledocholithiasis. There is no significant biliary dilatation. 2. Persistent mild gallbladder wall thickening and pericholecystic fluid, nonspecific. Recent  ultrasound demonstrated no sonographic Murphy's sign. 3. No evidence of pancreatitis or pancreatic ductal dilatation. 4. Stable bilateral renal cysts.   Electronically Signed   By: Camie Patience M.D.   On: 02/14/2013 17:10   Mr Abd W/wo Cm/mrcp  02/14/2013   CLINICAL DATA:  Abdominal pain with elevated liver function  studies. Possible cholecystitis. Evaluate for choledocholithiasis.  EXAM: MRI ABDOMEN WITHOUT AND WITH CONTRAST (INCLUDING MRCP)  TECHNIQUE: Multiplanar multisequence MR imaging of the abdomen was performed both before and after the administration of intravenous contrast. Heavily T2-weighted images of the biliary and pancreatic ducts were obtained, and three-dimensional MRCP images were rendered by post processing.  CONTRAST:  28mL MULTIHANCE GADOBENATE DIMEGLUMINE 529 MG/ML IV SOLN  COMPARISON:  CT ABD/PELVIS W CM dated 02/13/2013; CT ABD/PELVIS W CM dated 11/18/2011; US ABDOMEN LIMITED RUQ/ASCITES dated 02/13/2013  FINDINGS: The gallbladder has a stable appearance with mild wall thickening and pericholecystic fluid. No gallstones are identified. The common bile duct measures 7 mm in diameter. There is no intrahepatic biliary dilatation. Best seen on the thin section MRCP images are two probable small dependent filling defects in the distal common bile duct, suspicious for choledocholithiasis.  There is no pancreatic ductal dilatation or evidence of pancreas divisum. The pancreas is mildly atrophied without focal mass or abnormal enhancement. The liver appears unremarkable.  The spleen and adrenal glands appear normal. Bilateral renal cysts are stable. 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 Small lymph nodes within the upper abdomen appear unchanged.  IMPRESSION: 1. There is suspicion of small intraductal calculi in the distal common bile duct consistent with choledocholithiasis. There is no significant biliary dilatation. 2. Persistent mild gallbladder wall thickening and pericholecystic fluid, nonspecific. Recent ultrasound demonstrated no sonographic Murphy's sign. 3. No evidence of pancreatitis or pancreatic ductal dilatation. 4. Stable bilateral renal cysts.   Electronically Signed   By: Camie Patience M.D.   On: 02/14/2013 17:10    Medications: I have reviewed the patient's current  medications.  Assessment/Plan: 1. ? CBD stones on MRCP. LFTs still up pt with more symptoms. Will proceed with ERCP this PM by Dr Watt Climes. Have discussed with wife and extensively with the pt including the risks pancreatitis and bleeding.  No Xarelto x 3 days and will turn off the heparin drip 3 hours before procedure and type and screen blood. Discussed with nursing.    Kellye Mizner JR,Holland Kotter L 02/15/2013, 9:13 AM

## 2013-02-15 NOTE — Progress Notes (Signed)
ANTICOAGULATION CONSULT NOTE - F/U Consult  Pharmacy Consult for Heparin Indication: Chronic Xarelto for Afib, Heparin for bridging with surgery  Allergies  Allergen Reactions  . Codeine Nausea Only  . Lactose Intolerance (Gi) Other (See Comments)    Gas pain   Patient Measurements: Height: 5\' 9"  (175.3 cm) Weight: 193 lb 12.6 oz (87.9 kg) IBW/kg (Calculated) : 70.7 Heparin Dosing Weight: 90 kg  Vital Signs: Temp: 98.4 F (36.9 C) (01/09 0550) Temp src: Oral (01/09 0550) BP: 136/72 mmHg (01/09 0550) Pulse Rate: 75 (01/09 0550)  Labs:  Recent Labs  02/13/13 0351  02/14/13 0025 02/14/13 0931 02/14/13 2007 02/15/13 0514 02/15/13 0802  HGB 15.3  --   --   --   --  13.1  --   HCT 44.2  --   --   --   --  38.7*  --   PLT 164  --   --   --   --  134*  --   APTT  --   --   --  89*  --   --   --   HEPARINUNFRC  --   < > 1.36* 0.96* 1.01*  --  0.62  CREATININE 1.22  --  1.52*  --   --   --   --   < > = values in this interval not displayed. Estimated Creatinine Clearance: 48.2 ml/min (by C-G formula based on Cr of 1.52).  Medications:  Scheduled:  . metoprolol  25 mg Oral BID  . piperacillin-tazobactam (ZOSYN)  IV  3.375 g Intravenous Q8H  . simvastatin  40 mg Oral QPM  . tamsulosin  0.4 mg Oral QPM   Infusions:  . heparin 700 Units/hr (02/15/13 0026)   Assessment: 61 yoM with cholecystitis - planned surgery. Hx of Afib on chronic Xarelto, home dose 20mg  daily with last dose 1/6 @ 1800. Heparin to begin 24 hr after last dose Xarelto.  Initial heparin levels had been elevated, possibly from residual effects of xarelto PTA  Heparin level now therapeutic (0.62), xarelto effects should now be negligible   CBC slightly decreased from [place]  No bleeding/IV interuptions per RN  Evaluation for possible surgery in progress  Goal of Therapy:  Heparin level 0.3-0.7 units/ml Monitor platelets by anticoagulation protocol: Yes   Plan:   Continue heparin 700  units/hr  Daily CBC while on Heparin  Recheck HL in 8 hours (expect Xarelto effect will be gone)  Monitor for signs/sx of bleed  Peggyann Juba, PharmD, BCPS Pager: [phone] 02/15/2013, 8:47 AM

## 2013-02-15 NOTE — Op Note (Addendum)
[facility] [place], [address]   ENDOSCOPY PROCEDURE REPORT  PATIENT: Michael Mejia, Michael Mejia  MR#: [id number] BIRTHDATE: [birth year] , 73  yrs. old GENDER: Male  ENDOSCOPIST: Clarene Essex, MD REFERRED BY:  PROCEDURE DATE:  02/15/2013 PROCEDURE:   ERCP with removal of calculus/calculi and ERCP with sphincterotomy/papillotomy ASA CLASS:   Class II INDICATIONS:established bile duct stone(s).  MEDICATIONS: Fentanyl 75 mcg IV and Versed 6 mg IV  TOPICAL ANESTHETIC:used  DESCRIPTION OF PROCEDURE:   After the risks benefits and alternatives of the procedure were thoroughly explained, informed consent was obtained.  The ercp pentax [id number]  endoscope was introduced through the mouth and advanced to the second portion of the duodenum , limited by Without limitations.   The instrument was slowly withdrawn as the mucosa was fully examined.a moderate size periampullary diverticulum was seen and using the triple-lumen sphincterotome loaded with the JAG Jagwire deep selective cannulation was obtained and there was no pancreatic duct injections or wire advancements throughout the procedure and an obvious CBD stone was seen on the first cholangiogram and we went ahead and proceeded with a medium large sphincterotomy in the customary is fashion until we had adequate biliary drainage and could get the fully bowed sphincterotome easily in and out of the ductand then we exchanged the sphincterotome for the 12-15 mm adjustable: And on the first pull-through actually 2 or 3 stones were removed and we went ahead and proceed with a few more prep balloon pull-through without any further stones and then we proceeded with an occlusion cholangiogram which was normal and there was adequate biliary drainage and the 12 mm balloon passed readily through the patent sphincterotomy site and the wire and the balloon were removed and the scope was removed and the patient tolerated the  procedure well there was no obvious immediate complication             FINDINGS:1. Moderate size periampullary diverticulum 2 few CBD stones status post removal with balloon after sphincterotomy 3. No PD injections or wire advancements 4 negative occlusion cholangiogram and adequate biliary drainage at the end of the procedure COMPLICATIONS:none  ENDOSCOPIC IMPRESSION:above   RECOMMENDATIONS:observe for delayed complications if none laparoscopic cholecystectomy either this weekend or early next week   REPEAT EXAM: as needed   _______________________________ Clarene Essex, MD eSigned:  Clarene Essex, MD 02/15/2013 3:41 PM    CC:  PATIENT NAME:  Kayhan, Boardley MR#: [id number]

## 2013-02-15 NOTE — Progress Notes (Signed)
Chart reviewed continue medical management and heparin drip while patient is not in a procedure. We'll plan on reevaluating next a.m.  Burt Piatek, [employer]

## 2013-02-15 NOTE — Progress Notes (Signed)
Subjective: HE feels bad, having pain and nausea, heparin off for ERCP.    Objective: Vital signs in last 24 hours: Temp:  [97.5 F (36.4 C)-98.4 F (36.9 C)] 97.5 F (36.4 C) (01/09 1037) Pulse Rate:  [67-75] 75 (01/09 1037) Resp:  [12-20] 16 (01/09 1037) BP: (113-149)/(70-81) 149/81 mmHg (01/09 1037) SpO2:  [95 %-98 %] 98 % (01/09 1037) Last BM Date: 02/12/13 960 PO yesterday,  Afebrile, VSS Bilirubin is down some, but possible stone in CBD on MRCP Intake/Output from previous day: 01/08 0701 - 01/09 0700 In: 1564.7 [P.O.:960; I.V.:404.7; IV Piggyback:200] Out: -  Intake/Output this shift: Total I/O In: 240 [P.O.:240] Out: -   General appearance: alert, cooperative, no distress and but he feels really bad.  just had something for pain. GI: soft, but very tender RUQ now.  he had some nausea and it si better, pain is better with meds.  Lab Results:   Recent Labs  02/13/13 0351 02/15/13 0514  WBC 8.9 3.9*  HGB 15.3 13.1  HCT 44.2 38.7*  PLT 164 134*    BMET  Recent Labs  02/14/13 0025 02/15/13 0811  NA 135* 136*  K 4.1 4.2  CL 97 100  CO2 27 24  GLUCOSE 129* 142*  BUN 18 15  CREATININE 1.52* 1.42*  CALCIUM 8.6 8.8   PT/INR No results found for this basename: LABPROT, INR,  in the last 72 hours   Recent Labs Lab 02/13/13 0351 02/14/13 0025 02/15/13 0811  AST 273* 221* 83*  ALT 105* 200* 119*  ALKPHOS 78 86 117  BILITOT 2.4* 6.2* 4.4*  PROT 7.3 6.4 6.8  ALBUMIN 3.8 3.1* 3.1*     Lipase     Component Value Date/Time   LIPASE 15 02/14/2013 0025     Studies/Results: Dg Eye Foreign Body  02/14/2013   CLINICAL DATA:  Metal working/exposure; clearance prior to MRI  EXAM: ORBITS FOR FOREIGN BODY - 2 VIEW  COMPARISON:  None.  FINDINGS: There is no evidence of metallic foreign body within the orbits. No significant bone abnormality identified.  IMPRESSION: No evidence of metallic foreign body within the orbits.   Electronically Signed   By: David   Martin[place]   On: 02/14/2013 15:43   Mr 3d Recon At Scanner  02/14/2013   CLINICAL DATA:  Abdominal pain with elevated liver function studies. Possible cholecystitis. Evaluate for choledocholithiasis.  EXAM: MRI ABDOMEN WITHOUT AND WITH CONTRAST (INCLUDING MRCP)  TECHNIQUE: Multiplanar multisequence MR imaging of the abdomen was performed both before and after the administration of intravenous contrast. Heavily T2-weighted images of the biliary and pancreatic ducts were obtained, and three-dimensional MRCP images were rendered by post processing.  CONTRAST:  56mL MULTIHANCE GADOBENATE DIMEGLUMINE 529 MG/ML IV SOLN  COMPARISON:  CT ABD/PELVIS W CM dated 02/13/2013; CT ABD/PELVIS W CM dated 11/18/2011; US ABDOMEN LIMITED RUQ/ASCITES dated 02/13/2013  FINDINGS: The gallbladder has a stable appearance with mild wall thickening and pericholecystic fluid. No gallstones are identified. The common bile duct measures 7 mm in diameter. There is no intrahepatic biliary dilatation. Best seen on the thin section MRCP images are two probable small dependent filling defects in the distal common bile duct, suspicious for choledocholithiasis.  There is no pancreatic ductal dilatation or evidence of pancreas divisum. The pancreas is mildly atrophied without focal mass or abnormal enhancement. The liver appears unremarkable.  The spleen and adrenal glands appear normal. Bilateral renal cysts are stable. 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 Small lymph nodes within the upper  abdomen appear unchanged.  IMPRESSION: 1. There is suspicion of small intraductal calculi in the distal common bile duct consistent with choledocholithiasis. There is no significant biliary dilatation. 2. Persistent mild gallbladder wall thickening and pericholecystic fluid, nonspecific. Recent ultrasound demonstrated no sonographic Murphy's sign. 3. No evidence of pancreatitis or pancreatic ductal dilatation. 4. Stable bilateral renal cysts.   Electronically Signed    By: Camie Patience M.D.   On: 02/14/2013 17:10   Mr Abd W/wo Cm/mrcp  02/14/2013   CLINICAL DATA:  Abdominal pain with elevated liver function studies. Possible cholecystitis. Evaluate for choledocholithiasis.  EXAM: MRI ABDOMEN WITHOUT AND WITH CONTRAST (INCLUDING MRCP)  TECHNIQUE: Multiplanar multisequence MR imaging of the abdomen was performed both before and after the administration of intravenous contrast. Heavily T2-weighted images of the biliary and pancreatic ducts were obtained, and three-dimensional MRCP images were rendered by post processing.  CONTRAST:  63mL MULTIHANCE GADOBENATE DIMEGLUMINE 529 MG/ML IV SOLN  COMPARISON:  CT ABD/PELVIS W CM dated 02/13/2013; CT ABD/PELVIS W CM dated 11/18/2011; US ABDOMEN LIMITED RUQ/ASCITES dated 02/13/2013  FINDINGS: The gallbladder has a stable appearance with mild wall thickening and pericholecystic fluid. No gallstones are identified. The common bile duct measures 7 mm in diameter. There is no intrahepatic biliary dilatation. Best seen on the thin section MRCP images are two probable small dependent filling defects in the distal common bile duct, suspicious for choledocholithiasis.  There is no pancreatic ductal dilatation or evidence of pancreas divisum. The pancreas is mildly atrophied without focal mass or abnormal enhancement. The liver appears unremarkable.  The spleen and adrenal glands appear normal. Bilateral renal cysts are stable. 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 Small lymph nodes within the upper abdomen appear unchanged.  IMPRESSION: 1. There is suspicion of small intraductal calculi in the distal common bile duct consistent with choledocholithiasis. There is no significant biliary dilatation. 2. Persistent mild gallbladder wall thickening and pericholecystic fluid, nonspecific. Recent ultrasound demonstrated no sonographic Murphy's sign. 3. No evidence of pancreatitis or pancreatic ductal dilatation. 4. Stable bilateral renal cysts.    Electronically Signed   By: Camie Patience M.D.   On: 02/14/2013 17:10    Medications: . metoprolol  25 mg Oral BID  . piperacillin-tazobactam (ZOSYN)  IV  3.375 g Intravenous Q8H  . simvastatin  40 mg Oral QPM  . tamsulosin  0.4 mg Oral QPM    Assessment/Plan 1. Acute cholecystitis with cholelithiasis  2. Evelvated LFT's/MRCP shows possible small intraductal calculi/ERCP planned today by Dr. Watt Climes. 3. 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 S/p Exploratory laparotomy with resection of perforated small bowel diverticula with primary anastomosis, 10/23/12.  6. Hyperlipidemia   Plan.  ERCP today, Heparin bridge with Xarelto,, last dose 02/12/13.  Continue antibiotics, daily LFT'S till we get his GB out.  LOS: 2 days    Michael Mejia 02/15/2013

## 2013-02-16 LAB — CBC WITH DIFFERENTIAL/PLATELET
BASOS PCT: 0 % (ref 0–1)
Basophils Absolute: 0 10*3/uL (ref 0.0–0.1)
Eosinophils Absolute: 0.1 10*3/uL (ref 0.0–0.7)
Eosinophils Relative: 2 % (ref 0–5)
HCT: 39.1 % (ref 39.0–52.0)
HEMOGLOBIN: 13.1 g/dL (ref 13.0–17.0)
Lymphocytes Relative: 26 % (ref 12–46)
Lymphs Abs: 1.2 10*3/uL (ref 0.7–4.0)
MCH: 29.9 pg (ref 26.0–34.0)
MCHC: 33.5 g/dL (ref 30.0–36.0)
MCV: 89.3 fL (ref 78.0–100.0)
MONOS PCT: 11 % (ref 3–12)
Monocytes Absolute: 0.5 10*3/uL (ref 0.1–1.0)
NEUTROS ABS: 3 10*3/uL (ref 1.7–7.7)
NEUTROS PCT: 61 % (ref 43–77)
Platelets: 132 10*3/uL — ABNORMAL LOW (ref 150–400)
RBC: 4.38 MIL/uL (ref 4.22–5.81)
RDW: 13.7 % (ref 11.5–15.5)
WBC: 4.8 10*3/uL (ref 4.0–10.5)

## 2013-02-16 LAB — LIPASE, BLOOD: LIPASE: 18 U/L (ref 11–59)

## 2013-02-16 LAB — HEPARIN LEVEL (UNFRACTIONATED)
HEPARIN UNFRACTIONATED: 0.37 [IU]/mL (ref 0.30–0.70)
Heparin Unfractionated: 0.31 IU/mL (ref 0.30–0.70)
Heparin Unfractionated: 0.16 IU/mL — ABNORMAL LOW (ref 0.30–0.70)

## 2013-02-16 LAB — COMPREHENSIVE METABOLIC PANEL
ALBUMIN: 3 g/dL — AB (ref 3.5–5.2)
ALT: 88 U/L — ABNORMAL HIGH (ref 0–53)
AST: 58 U/L — ABNORMAL HIGH (ref 0–37)
Alkaline Phosphatase: 145 U/L — ABNORMAL HIGH (ref 39–117)
BILIRUBIN TOTAL: 3.4 mg/dL — AB (ref 0.3–1.2)
BUN: 12 mg/dL (ref 6–23)
CO2: 27 mEq/L (ref 19–32)
CREATININE: 1.34 mg/dL (ref 0.50–1.35)
Calcium: 8.5 mg/dL (ref 8.4–10.5)
Chloride: 100 mEq/L (ref 96–112)
GFR calc Af Amer: 59 mL/min — ABNORMAL LOW (ref >90)
GFR calc non Af Amer: 51 mL/min — ABNORMAL LOW (ref >90)
Glucose, Bld: 110 mg/dL — ABNORMAL HIGH (ref 70–99)
Potassium: 4.3 mEq/L (ref 3.7–5.3)
Sodium: 138 mEq/L (ref 137–147)
TOTAL PROTEIN: 6.6 g/dL (ref 6.0–8.3)

## 2013-02-16 MED ORDER — HEPARIN (PORCINE) IN NACL 100-0.45 UNIT/ML-% IJ SOLN: 950.0000 [IU]/h | INTRAMUSCULAR | Status: AC

## 2013-02-16 NOTE — Progress Notes (Signed)
Patient ID: Michael Mejia, male   DOB: 07/26/1940, 73 y.o.   MRN: [id number] [facility] Surgery Progress Note:   1 Day Post-Op  Subjective: Mental status is clear.  Patient well known to me from e lap last year.   Objective: Vital signs in last 24 hours: Temp:  [97.5 F (36.4 C)-98.6 F (37 C)] 98.6 F (37 C) (01/10 0620) Pulse Rate:  [65-88] 73 (01/10 0620) Resp:  [10-29] 20 (01/10 0620) BP: (108-167)/(77-108) 137/84 mmHg (01/10 0620) SpO2:  [82 %-99 %] 98 % (01/10 0620)  Intake/Output from previous day: 01/09 0701 - 01/10 0700 In: 1058.5 [P.O.:720; I.V.:108.5; IV Piggyback:150] Out: -  Intake/Output this shift: Total I/O In: 240 [P.O.:240] Out: -   Physical Exam: Work of breathing is normal.  Abdomen is soft and nontender.  Has been off Xarelto for 5 days.    Lab Results:  Results for orders placed during the hospital encounter of 02/13/13 (from the past 48 hour(s))  HEPARIN LEVEL (UNFRACTIONATED)     Status: Abnormal   Collection Time    02/14/13  8:07 PM      Result Value Range   Heparin Unfractionated 1.01 (*) 0.30 - 0.70 IU/mL   Comment:            IF HEPARIN RESULTS ARE BELOW     EXPECTED VALUES, AND PATIENT     DOSAGE HAS BEEN CONFIRMED,     SUGGEST FOLLOW UP TESTING     OF ANTITHROMBIN III LEVELS.  CBC     Status: Abnormal   Collection Time    02/15/13  5:14 AM      Result Value Range   WBC 3.9 (*) 4.0 - 10.5 K/uL   RBC 4.39  4.22 - 5.81 MIL/uL   Hemoglobin 13.1  13.0 - 17.0 g/dL   HCT 38.7 (*) 39.0 - 52.0 %   MCV 88.2  78.0 - 100.0 fL   MCH 29.8  26.0 - 34.0 pg   MCHC 33.9  30.0 - 36.0 g/dL   RDW 13.8  11.5 - 15.5 %   Platelets 134 (*) 150 - 400 K/uL  HEPARIN LEVEL (UNFRACTIONATED)     Status: None   Collection Time    02/15/13  8:02 AM      Result Value Range   Heparin Unfractionated 0.62  0.30 - 0.70 IU/mL   Comment:            IF HEPARIN RESULTS ARE BELOW     EXPECTED VALUES, AND PATIENT     DOSAGE HAS BEEN CONFIRMED,     SUGGEST FOLLOW UP  TESTING     OF ANTITHROMBIN III LEVELS.  COMPREHENSIVE METABOLIC PANEL     Status: Abnormal   Collection Time    02/15/13  8:11 AM      Result Value Range   Sodium 136 (*) 137 - 147 mEq/L   Potassium 4.2  3.7 - 5.3 mEq/L   Chloride 100  96 - 112 mEq/L   CO2 24  19 - 32 mEq/L   Glucose, Bld 142 (*) 70 - 99 mg/dL   BUN 15  6 - 23 mg/dL   Creatinine, Ser 1.42 (*) 0.50 - 1.35 mg/dL   Calcium 8.8  8.4 - 10.5 mg/dL   Total Protein 6.8  6.0 - 8.3 g/dL   Albumin 3.1 (*) 3.5 - 5.2 g/dL   AST 83 (*) 0 - 37 U/L   ALT 119 (*) 0 - 53 U/L   Alkaline  Phosphatase 117  39 - 117 U/L   Total Bilirubin 4.4 (*) 0.3 - 1.2 mg/dL   GFR calc non Af Amer 48 (*) >90 mL/min   GFR calc Af Amer 55 (*) >90 mL/min   Comment: (NOTE)     The eGFR has been calculated using the CKD EPI equation.     This calculation has not been validated in all clinical situations.     eGFR's persistently <90 mL/min signify possible Chronic Kidney     Disease.  ABO/RH     Status: None   Collection Time    02/15/13  9:30 AM      Result Value Range   ABO/RH(D) A NEG    TYPE AND SCREEN     Status: None   Collection Time    02/15/13  9:51 AM      Result Value Range   ABO/RH(D) A NEG     Antibody Screen NEG     Sample Expiration 02/18/2013    COMPREHENSIVE METABOLIC PANEL     Status: Abnormal   Collection Time    02/16/13  4:12 AM      Result Value Range   Sodium 138  137 - 147 mEq/L   Potassium 4.3  3.7 - 5.3 mEq/L   Chloride 100  96 - 112 mEq/L   CO2 27  19 - 32 mEq/L   Glucose, Bld 110 (*) 70 - 99 mg/dL   BUN 12  6 - 23 mg/dL   Creatinine, Ser 1.34  0.50 - 1.35 mg/dL   Calcium 8.5  8.4 - 10.5 mg/dL   Total Protein 6.6  6.0 - 8.3 g/dL   Albumin 3.0 (*) 3.5 - 5.2 g/dL   AST 58 (*) 0 - 37 U/L   ALT 88 (*) 0 - 53 U/L   Alkaline Phosphatase 145 (*) 39 - 117 U/L   Total Bilirubin 3.4 (*) 0.3 - 1.2 mg/dL   GFR calc non Af Amer 51 (*) >90 mL/min   GFR calc Af Amer 59 (*) >90 mL/min   Comment: (NOTE)     The eGFR has  been calculated using the CKD EPI equation.     This calculation has not been validated in all clinical situations.     eGFR's persistently <90 mL/min signify possible Chronic Kidney     Disease.  LIPASE, BLOOD     Status: None   Collection Time    02/16/13  4:12 AM      Result Value Range   Lipase 18  11 - 59 U/L  HEPARIN LEVEL (UNFRACTIONATED)     Status: None   Collection Time    02/16/13  4:12 AM      Result Value Range   Heparin Unfractionated 0.31  0.30 - 0.70 IU/mL   Comment:            IF HEPARIN RESULTS ARE BELOW     EXPECTED VALUES, AND PATIENT     DOSAGE HAS BEEN CONFIRMED,     SUGGEST FOLLOW UP TESTING     OF ANTITHROMBIN III LEVELS.  CBC WITH DIFFERENTIAL     Status: Abnormal   Collection Time    02/16/13  4:12 AM      Result Value Range   WBC 4.8  4.0 - 10.5 K/uL   RBC 4.38  4.22 - 5.81 MIL/uL   Hemoglobin 13.1  13.0 - 17.0 g/dL   HCT 39.1  39.0 - 52.0 %   MCV 89.3  78.0 -  100.0 fL   MCH 29.9  26.0 - 34.0 pg   MCHC 33.5  30.0 - 36.0 g/dL   RDW 13.7  11.5 - 15.5 %   Platelets 132 (*) 150 - 400 K/uL   Neutrophils Relative % 61  43 - 77 %   Neutro Abs 3.0  1.7 - 7.7 K/uL   Lymphocytes Relative 26  12 - 46 %   Lymphs Abs 1.2  0.7 - 4.0 K/uL   Monocytes Relative 11  3 - 12 %   Monocytes Absolute 0.5  0.1 - 1.0 K/uL   Eosinophils Relative 2  0 - 5 %   Eosinophils Absolute 0.1  0.0 - 0.7 K/uL   Basophils Relative 0  0 - 1 %   Basophils Absolute 0.0  0.0 - 0.1 K/uL    Radiology/Results: Dg Eye Foreign Body  02/14/2013   CLINICAL DATA:  Metal working/exposure; clearance prior to MRI  EXAM: ORBITS FOR FOREIGN BODY - 2 VIEW  COMPARISON:  None.  FINDINGS: There is no evidence of metallic foreign body within the orbits. No significant bone abnormality identified.  IMPRESSION: No evidence of metallic foreign body within the orbits.   Electronically Signed   By: David  Martin[place]   On: 02/14/2013 15:43   Mr 3d Recon At Scanner  02/14/2013   CLINICAL DATA:  Abdominal pain  with elevated liver function studies. Possible cholecystitis. Evaluate for choledocholithiasis.  EXAM: MRI ABDOMEN WITHOUT AND WITH CONTRAST (INCLUDING MRCP)  TECHNIQUE: Multiplanar multisequence MR imaging of the abdomen was performed both before and after the administration of intravenous contrast. Heavily T2-weighted images of the biliary and pancreatic ducts were obtained, and three-dimensional MRCP images were rendered by post processing.  CONTRAST:  56m MULTIHANCE GADOBENATE DIMEGLUMINE 529 MG/ML IV SOLN  COMPARISON:  CT ABD/PELVIS W CM dated 02/13/2013; CT ABD/PELVIS W CM dated 11/18/2011; [place] RUQ/ASCITES dated 02/13/2013  FINDINGS: The gallbladder has a stable appearance with mild wall thickening and pericholecystic fluid. No gallstones are identified. The common bile duct measures 7 mm in diameter. There is no intrahepatic biliary dilatation. Best seen on the thin section MRCP images are two probable small dependent filling defects in the distal common bile duct, suspicious for choledocholithiasis.  There is no pancreatic ductal dilatation or evidence of pancreas divisum. The pancreas is mildly atrophied without focal mass or abnormal enhancement. The liver appears unremarkable.  The spleen and adrenal glands appear normal. Bilateral renal cysts are stable. 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 Small lymph nodes within the upper abdomen appear unchanged.  IMPRESSION: 1. There is suspicion of small intraductal calculi in the distal common bile duct consistent with choledocholithiasis. There is no significant biliary dilatation. 2. Persistent mild gallbladder wall thickening and pericholecystic fluid, nonspecific. Recent ultrasound demonstrated no sonographic Murphy's sign. 3. No evidence of pancreatitis or pancreatic ductal dilatation. 4. Stable bilateral renal cysts.   Electronically Signed   By: BCamie PatienceM.D.   On: 02/14/2013 17:10   Mr Abd W/wo Cm/mrcp  02/14/2013   CLINICAL DATA:   Abdominal pain with elevated liver function studies. Possible cholecystitis. Evaluate for choledocholithiasis.  EXAM: MRI ABDOMEN WITHOUT AND WITH CONTRAST (INCLUDING MRCP)  TECHNIQUE: Multiplanar multisequence MR imaging of the abdomen was performed both before and after the administration of intravenous contrast. Heavily T2-weighted images of the biliary and pancreatic ducts were obtained, and three-dimensional MRCP images were rendered by post processing.  CONTRAST:  927mMULTIHANCE GADOBENATE DIMEGLUMINE 529 MG/ML IV SOLN  COMPARISON:  CT ABD/PELVIS W CM dated 02/13/2013; CT ABD/PELVIS W CM dated 11/18/2011; US ABDOMEN LIMITED RUQ/ASCITES dated 02/13/2013  FINDINGS: The gallbladder has a stable appearance with mild wall thickening and pericholecystic fluid. No gallstones are identified. The common bile duct measures 7 mm in diameter. There is no intrahepatic biliary dilatation. Best seen on the thin section MRCP images are two probable small dependent filling defects in the distal common bile duct, suspicious for choledocholithiasis.  There is no pancreatic ductal dilatation or evidence of pancreas divisum. The pancreas is mildly atrophied without focal mass or abnormal enhancement. The liver appears unremarkable.  The spleen and adrenal glands appear normal. Bilateral renal cysts are stable. 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 Small lymph nodes within the upper abdomen appear unchanged.  IMPRESSION: 1. There is suspicion of small intraductal calculi in the distal common bile duct consistent with choledocholithiasis. There is no significant biliary dilatation. 2. Persistent mild gallbladder wall thickening and pericholecystic fluid, nonspecific. Recent ultrasound demonstrated no sonographic Murphy's sign. 3. No evidence of pancreatitis or pancreatic ductal dilatation. 4. Stable bilateral renal cysts.   Electronically Signed   By: Camie Patience M.D.   On: 02/14/2013 17:10    Anti-infectives: Anti-infectives    Start     Dose/Rate Route Frequency Ordered Stop   02/13/13 1800  piperacillin-tazobactam (ZOSYN) IVPB 3.375 g     3.375 g 12.5 mL/hr over 240 Minutes Intravenous Every 8 hours 02/13/13 1003     02/13/13 0900  piperacillin-tazobactam (ZOSYN) IVPB 3.375 g     3.375 g 100 mL/hr over 30 Minutes Intravenous  Once 02/13/13 0851 02/13/13 1137      Assessment/Plan: Problem List: Patient Active Problem List   Diagnosis Date Noted  . Cholelithiasis with acute cholecystitis 02/13/2013  . Perforated viscus 10/23/2011  . Atrial fibrillation, not on coumadin 10/23/2011    Request hold heparin after midnight for lap chole in the AM.   1 Day Post-Op    LOS: 3 days   Matt B. Hassell Done, MD, [facility] [phone] beeper [phone]  02/16/2013 11:41 AM

## 2013-02-16 NOTE — Progress Notes (Signed)
Rx Brief Anticoagulation note:  IV Heparin  Assessement:  HL= 0.37 after increasing drip to 950 units/ml  No problems reported  Plan:  Continue drip @ 950 units/hr  Drip to stop @ [place] for surgery tomorrow  F/U post-op anticoagulation plans   Michael Mejia 02/16/2013 11:06 PM

## 2013-02-16 NOTE — Progress Notes (Signed)
[facility] PROGRESS NOTE  Michael Mejia QIW:[id number] DOB: 1940/10/05 DOA: 02/13/2013 PCP: Vena Austria, MD  Assessment/Plan: Atrial fibrillation  - off of xarelto for surgery most likely this weekend - rate controlled with metoprolol 25 mg PO BID  - On heparin per pharmacy for anticoagulation since xarelto being held   BPH  - continue flomax   Dyslipidemia  - continue statin therapy  -Stable  Hypertension  - metoprolol 25 mg BID  Relatively well controlled on this regimen  Code Status: full Family Communication: No family at bedside Disposition Plan: Per surgeons   Procedures:  Pending  Antibiotics:  Zosyn  HPI/Subjective: No new complaints to me feels less pain since ERCP done yesterday.  Objective: Filed Vitals:   02/16/13 0620  BP: 137/84  Pulse: 73  Temp: 98.6 F (37 C)  Resp: 20    Intake/Output Summary (Last 24 hours) at 02/16/13 1329 Last data filed at 02/16/13 0900  Gross per 24 hour  Intake 1058.5 ml  Output      0 ml  Net 1058.5 ml   Filed Weights   02/13/13 0342 02/13/13 1247  Weight: 88.905 kg (196 lb) 87.9 kg (193 lb 12.6 oz)    Exam:   General:  Pt in NAD, Alert and awake  Cardiovascular: irregularly irregular, rate WNL, no murmurs  Respiratory: CTA BL, no wheezes  Abdomen: DN, soft  Musculoskeletal: no cyanosis or clubbing   Data Reviewed: Basic Metabolic Panel:  Recent Labs Lab 02/13/13 0351 02/14/13 0025 02/15/13 0811 02/16/13 0412  NA 140 135* 136* 138  K 4.5 4.1 4.2 4.3  CL 101 97 100 100  CO2 26 27 24 27   GLUCOSE 157* 129* 142* 110*  BUN 20 18 15 12   CREATININE 1.22 1.52* 1.42* 1.34  CALCIUM 9.5 8.6 8.8 8.5   Liver Function Tests:  Recent Labs Lab 02/13/13 0351 02/14/13 0025 02/15/13 0811 02/16/13 0412  AST 273* 221* 83* 58*  ALT 105* 200* 119* 88*  ALKPHOS 78 86 117 145*  BILITOT 2.4* 6.2* 4.4* 3.4*  PROT 7.3 6.4 6.8 6.6  ALBUMIN 3.8 3.1* 3.1* 3.0*    Recent Labs Lab  02/13/13 0351 02/14/13 0025 02/16/13 0412  LIPASE 29 15 18    No results found for this basename: AMMONIA,  in the last 168 hours CBC:  Recent Labs Lab 02/13/13 0351 02/15/13 0514 02/16/13 0412  WBC 8.9 3.9* 4.8  NEUTROABS 7.6  --  3.0  HGB 15.3 13.1 13.1  HCT 44.2 38.7* 39.1  MCV 88.2 88.2 89.3  PLT 164 134* 132*   Cardiac Enzymes: No results found for this basename: CKTOTAL, CKMB, CKMBINDEX, TROPONINI,  in the last 168 hours BNP (last 3 results) No results found for this basename: PROBNP,  in the last 8760 hours CBG: No results found for this basename: GLUCAP,  in the last 168 hours  No results found for this or any previous visit (from the past 240 hour(s)).   Studies: Dg Eye Foreign Body  02/14/2013   CLINICAL DATA:  Metal working/exposure; clearance prior to MRI  EXAM: ORBITS FOR FOREIGN BODY - 2 VIEW  COMPARISON:  None.  FINDINGS: There is no evidence of metallic foreign body within the orbits. No significant bone abnormality identified.  IMPRESSION: No evidence of metallic foreign body within the orbits.   Electronically Signed   By: David  Martinique   On: 02/14/2013 15:43   Mr 3d Recon At Scanner  02/14/2013   CLINICAL DATA:  Abdominal pain with  elevated liver function studies. Possible cholecystitis. Evaluate for choledocholithiasis.  EXAM: MRI ABDOMEN WITHOUT AND WITH CONTRAST (INCLUDING MRCP)  TECHNIQUE: Multiplanar multisequence MR imaging of the abdomen was performed both before and after the administration of intravenous contrast. Heavily T2-weighted images of the biliary and pancreatic ducts were obtained, and three-dimensional MRCP images were rendered by post processing.  CONTRAST:  73mL MULTIHANCE GADOBENATE DIMEGLUMINE 529 MG/ML IV SOLN  COMPARISON:  CT ABD/PELVIS W CM dated 02/13/2013; CT ABD/PELVIS W CM dated 11/18/2011; US ABDOMEN LIMITED RUQ/ASCITES dated 02/13/2013  FINDINGS: The gallbladder has a stable appearance with mild wall thickening and pericholecystic fluid.  No gallstones are identified. The common bile duct measures 7 mm in diameter. There is no intrahepatic biliary dilatation. Best seen on the thin section MRCP images are two probable small dependent filling defects in the distal common bile duct, suspicious for choledocholithiasis.  There is no pancreatic ductal dilatation or evidence of pancreas divisum. The pancreas is mildly atrophied without focal mass or abnormal enhancement. The liver appears unremarkable.  The spleen and adrenal glands appear normal. Bilateral renal cysts are stable. 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 Small lymph nodes within the upper abdomen appear unchanged.  IMPRESSION: 1. There is suspicion of small intraductal calculi in the distal common bile duct consistent with choledocholithiasis. There is no significant biliary dilatation. 2. Persistent mild gallbladder wall thickening and pericholecystic fluid, nonspecific. Recent ultrasound demonstrated no sonographic Murphy's sign. 3. No evidence of pancreatitis or pancreatic ductal dilatation. 4. Stable bilateral renal cysts.   Electronically Signed   By: Camie Patience M.D.   On: 02/14/2013 17:10   Mr Abd W/wo Cm/mrcp  02/14/2013   CLINICAL DATA:  Abdominal pain with elevated liver function studies. Possible cholecystitis. Evaluate for choledocholithiasis.  EXAM: MRI ABDOMEN WITHOUT AND WITH CONTRAST (INCLUDING MRCP)  TECHNIQUE: Multiplanar multisequence MR imaging of the abdomen was performed both before and after the administration of intravenous contrast. Heavily T2-weighted images of the biliary and pancreatic ducts were obtained, and three-dimensional MRCP images were rendered by post processing.  CONTRAST:  70mL MULTIHANCE GADOBENATE DIMEGLUMINE 529 MG/ML IV SOLN  COMPARISON:  CT ABD/PELVIS W CM dated 02/13/2013; CT ABD/PELVIS W CM dated 11/18/2011; US ABDOMEN LIMITED RUQ/ASCITES dated 02/13/2013  FINDINGS: The gallbladder has a stable appearance with mild wall thickening and  pericholecystic fluid. No gallstones are identified. The common bile duct measures 7 mm in diameter. There is no intrahepatic biliary dilatation. Best seen on the thin section MRCP images are two probable small dependent filling defects in the distal common bile duct, suspicious for choledocholithiasis.  There is no pancreatic ductal dilatation or evidence of pancreas divisum. The pancreas is mildly atrophied without focal mass or abnormal enhancement. The liver appears unremarkable.  The spleen and adrenal glands appear normal. Bilateral renal cysts are stable. 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 Small lymph nodes within the upper abdomen appear unchanged.  IMPRESSION: 1. There is suspicion of small intraductal calculi in the distal common bile duct consistent with choledocholithiasis. There is no significant biliary dilatation. 2. Persistent mild gallbladder wall thickening and pericholecystic fluid, nonspecific. Recent ultrasound demonstrated no sonographic Murphy's sign. 3. No evidence of pancreatitis or pancreatic ductal dilatation. 4. Stable bilateral renal cysts.   Electronically Signed   By: Camie Patience M.D.   On: 02/14/2013 17:10    Scheduled Meds: . metoprolol  25 mg Oral BID  . piperacillin-tazobactam (ZOSYN)  IV  3.375 g Intravenous Q8H  . simvastatin  40 mg Oral QPM  .  tamsulosin  0.4 mg Oral QPM   Continuous Infusions: . heparin 700 Units/hr (02/16/13 1232)    Active Problems:   Atrial fibrillation, not on coumadin   Cholelithiasis with acute cholecystitis    Time spent: >20 minutes    Velvet Bathe  [facility] Pager [phone]. If 7PM-7AM, please contact night-coverage at [email or web address], password [facility] 02/16/2013, 1:29 PM  LOS: 3 days

## 2013-02-16 NOTE — Progress Notes (Signed)
Patient ID: Michael Mejia, male   DOB: 04-01-1940, 73 y.o.   MRN: [id number] [facility] Gastroenterology Progress Note  Michael Mejia 73 y.o. [birth year]   Subjective: Feels ok. Denies N/V/abdominal pain. S/P ERCP yesterday with CBD stones removed.  Objective: Vital signs in last 24 hours: Filed Vitals:   02/16/13 0620  BP: 137/84  Pulse: 73  Temp: 98.6 F (37 C)  Resp: 20    Physical Exam: Gen: alert, no acute distress Abd: soft, nontender, nondistended, +BS  Lab Results:  Recent Labs  02/15/13 0811 02/16/13 0412  NA 136* 138  K 4.2 4.3  CL 100 100  CO2 24 27  GLUCOSE 142* 110*  BUN 15 12  CREATININE 1.42* 1.34  CALCIUM 8.8 8.5    Recent Labs  02/15/13 0811 02/16/13 0412  AST 83* 58*  ALT 119* 88*  ALKPHOS 117 145*  BILITOT 4.4* 3.4*  PROT 6.8 6.6  ALBUMIN 3.1* 3.0*    Recent Labs  02/15/13 0514 02/16/13 0412  WBC 3.9* 4.8  NEUTROABS  --  3.0  HGB 13.1 13.1  HCT 38.7* 39.1  MCV 88.2 89.3  PLT 134* 132*   No results found for this basename: LABPROT, INR,  in the last 72 hours    Assessment/Plan: 73 yo s/p ERCP for CBD stones yesterday on IV Hep for Afib. Transaminases improving. Hgb stable. GB surgery planned for this admission. Keep on clears. Will follow.   [place] C. 02/16/2013, 11:33 AM

## 2013-02-16 NOTE — Progress Notes (Addendum)
ANTICOAGULATION CONSULT NOTE - F/U Consult  Pharmacy Consult for Heparin Indication: Chronic Xarelto for Afib, Heparin for bridging with surgery  Allergies  Allergen Reactions  . Codeine Nausea Only  . Lactose Intolerance (Gi) Other (See Comments)    Gas pain   Patient Measurements: Height: 5\' 9"  (175.3 cm) Weight: 193 lb 12.6 oz (87.9 kg) IBW/kg (Calculated) : 70.7 Heparin Dosing Weight: 90 kg  Vital Signs: Temp: 98.6 F (37 C) (01/10 0620) Temp src: Oral (01/10 0620) BP: 137/84 mmHg (01/10 0620) Pulse Rate: 73 (01/10 0620)  Labs:  Recent Labs  02/14/13 0025 02/14/13 0931 02/14/13 2007 02/15/13 0514 02/15/13 0802 02/15/13 0811 02/16/13 0412  HGB  --   --   --  13.1  --   --  13.1  HCT  --   --   --  38.7*  --   --  39.1  PLT  --   --   --  134*  --   --  132*  APTT  --  89*  --   --   --   --   --   HEPARINUNFRC 1.36* 0.96* 1.01*  --  0.62  --  0.31  CREATININE 1.52*  --   --   --   --  1.42* 1.34   Estimated Creatinine Clearance: 54.7 ml/min (by C-G formula based on Cr of 1.34).  Medications:  Scheduled:  . metoprolol  25 mg Oral BID  . piperacillin-tazobactam (ZOSYN)  IV  3.375 g Intravenous Q8H  . simvastatin  40 mg Oral QPM  . tamsulosin  0.4 mg Oral QPM   Infusions:  . heparin 700 Units/hr (02/15/13 2251)   Assessment: 35 yoM with acute cholecystitis with cholelithiasis - planned surgery. Hx of Afib on chronic Xarelto, home dose 20mg  daily.  Pharmacy consulted to manage heparin bridge for surgery.  Heparin started 1/7, 24 hours following last dose of Xarelto.  Initial heparin levels had been elevated, possibly from residual effects of xarelto PTA.  Patient now s/p ERCP 1/9.  IV heparin resumed the evening of 1/9.   Heparin level therapeutic at 0.31 this AM with infusion at 700 units/hr.  Hgb 13.1, Platelets 132 No bleeding/complications reported.  Surgeon's operative note 1/9 recommends observation for delayed complications post-op with plans to  proceed with laparoscopic cholecystectomy this weekend or early next week if no complications.    Goal of Therapy:  Heparin level 0.3-0.7 units/ml Monitor platelets by anticoagulation protocol: Yes   Plan:   Continue heparin 700 units/hr.  Recheck heparin level this afternoon to confirm rate.  Daily CBC while on Heparin  Monitor for signs/sx of bleed  Follow up plans for possible further surgery.    Hershal Coria, PharmD, BCPS Pager: [phone] 02/16/2013 8:02 AM   ADDENDUM: 02/16/2013 1:49 PM Repeated heparin level subtherapeutic at 0.16 with infusion at 700 units/hr. No complications or line interruptions per RN.  Plan: 1.  Increase heparin rate to 950 units/hr (9.5 mL/hr). 2.  Recheck HL 8 hours following rate adjustment (at 2200).  Rechecking level prior to holding heparin so we know what rate to resume heparin post-operatively. 3.  Plan to HOLD heparin at midnight tonight per surgery order in preparation of surgery tomorrow. 4.  Pharmacy to f/u post-op for anticoagulation plans.  Hershal Coria, PharmD, BCPS Pager: [phone] 02/16/2013 1:52 PM

## 2013-02-17 ENCOUNTER — Encounter (HOSPITAL_COMMUNITY): Admission: EM | Disposition: A | Discharge status: Home Health | Payer: Medicare Other | Source: Home / Self Care

## 2013-02-17 ENCOUNTER — Encounter (HOSPITAL_COMMUNITY): Payer: Medicare Other | Admitting: Anesthesiology

## 2013-02-17 ENCOUNTER — Inpatient Hospital Stay (HOSPITAL_COMMUNITY): Payer: Medicare Other

## 2013-02-17 ENCOUNTER — Inpatient Hospital Stay (HOSPITAL_COMMUNITY): Payer: Medicare Other | Admitting: Anesthesiology

## 2013-02-17 ENCOUNTER — Encounter (HOSPITAL_COMMUNITY): Payer: Self-pay | Admitting: Anesthesiology

## 2013-02-17 DIAGNOSIS — Z9049 Acquired absence of other specified parts of digestive tract: Secondary | ICD-10-CM

## 2013-02-17 DIAGNOSIS — Z9889 Other specified postprocedural states: Secondary | ICD-10-CM

## 2013-02-17 DIAGNOSIS — K811 Chronic cholecystitis: Secondary | ICD-10-CM

## 2013-02-17 HISTORY — DX: Acquired absence of other specified parts of digestive tract: Z90.49

## 2013-02-17 HISTORY — PX: CHOLECYSTECTOMY: SHX55

## 2013-02-17 LAB — COMPREHENSIVE METABOLIC PANEL
ALBUMIN: 3 g/dL — AB (ref 3.5–5.2)
ALK PHOS: 142 U/L — AB (ref 39–117)
ALT: 80 U/L — ABNORMAL HIGH (ref 0–53)
AST: 68 U/L — AB (ref 0–37)
BUN: 13 mg/dL (ref 6–23)
CHLORIDE: 102 meq/L (ref 96–112)
CO2: 26 mEq/L (ref 19–32)
Calcium: 9 mg/dL (ref 8.4–10.5)
Creatinine, Ser: 1.4 mg/dL — ABNORMAL HIGH (ref 0.50–1.35)
GFR calc Af Amer: 56 mL/min — ABNORMAL LOW (ref >90)
GFR calc non Af Amer: 49 mL/min — ABNORMAL LOW (ref >90)
Glucose, Bld: 106 mg/dL — ABNORMAL HIGH (ref 70–99)
Potassium: 4.1 mEq/L (ref 3.7–5.3)
SODIUM: 139 meq/L (ref 137–147)
Total Bilirubin: 1.8 mg/dL — ABNORMAL HIGH (ref 0.3–1.2)
Total Protein: 6.9 g/dL (ref 6.0–8.3)

## 2013-02-17 LAB — CBC
HEMATOCRIT: 41.4 % (ref 39.0–52.0)
Hemoglobin: 14.2 g/dL (ref 13.0–17.0)
MCH: 30 pg (ref 26.0–34.0)
MCHC: 34.3 g/dL (ref 30.0–36.0)
MCV: 87.5 fL (ref 78.0–100.0)
PLATELETS: 148 10*3/uL — AB (ref 150–400)
RBC: 4.73 MIL/uL (ref 4.22–5.81)
RDW: 13.5 % (ref 11.5–15.5)
WBC: 3.8 10*3/uL — AB (ref 4.0–10.5)

## 2013-02-17 LAB — SURGICAL PCR SCREEN
MRSA, PCR: NEGATIVE
Staphylococcus aureus: NEGATIVE

## 2013-02-17 SURGERY — LAPAROSCOPIC CHOLECYSTECTOMY WITH INTRAOPERATIVE CHOLANGIOGRAM
Anesthesia: General | Site: Abdomen

## 2013-02-17 MED ORDER — GLYCOPYRROLATE 0.2 MG/ML IJ SOLN
INTRAMUSCULAR | Status: AC
  Filled 2013-02-17: qty 3

## 2013-02-17 MED ORDER — BUPIVACAINE LIPOSOME 1.3 % IJ SUSP
20.0000 mL | Freq: Once | INTRAMUSCULAR | Status: AC
  Administered 2013-02-17: 09:00:00 20 mL
  Filled 2013-02-17: qty 20

## 2013-02-17 MED ORDER — ROCURONIUM BROMIDE 100 MG/10ML IV SOLN
INTRAVENOUS | Status: DC | PRN
  Administered 2013-02-17: 10 mg via INTRAVENOUS
  Administered 2013-02-17: 30 mg via INTRAVENOUS

## 2013-02-17 MED ORDER — ONDANSETRON HCL 4 MG/2ML IJ SOLN
INTRAMUSCULAR | Status: DC | PRN
  Administered 2013-02-17: 4 mg via INTRAVENOUS

## 2013-02-17 MED ORDER — LIDOCAINE HCL (CARDIAC) 20 MG/ML IV SOLN
INTRAVENOUS | Status: DC | PRN
  Administered 2013-02-17: 50 mg via INTRAVENOUS

## 2013-02-17 MED ORDER — METOCLOPRAMIDE HCL 5 MG/ML IJ SOLN
INTRAMUSCULAR | Status: DC | PRN
  Administered 2013-02-17: 10 mg via INTRAVENOUS

## 2013-02-17 MED ORDER — NEOSTIGMINE METHYLSULFATE 1 MG/ML IJ SOLN
INTRAMUSCULAR | Status: DC | PRN
  Administered 2013-02-17: 5 mg via INTRAVENOUS

## 2013-02-17 MED ORDER — METOPROLOL TARTRATE 1 MG/ML IV SOLN
INTRAVENOUS | Status: AC
  Filled 2013-02-17: qty 5

## 2013-02-17 MED ORDER — METOPROLOL TARTRATE 1 MG/ML IV SOLN
INTRAVENOUS | Status: DC | PRN
  Administered 2013-02-17: 1 mg via INTRAVENOUS

## 2013-02-17 MED ORDER — GLYCOPYRROLATE 0.2 MG/ML IJ SOLN
INTRAMUSCULAR | Status: DC | PRN
  Administered 2013-02-17: 0.6 mg via INTRAVENOUS

## 2013-02-17 MED ORDER — LIDOCAINE HCL (CARDIAC) 20 MG/ML IV SOLN
INTRAVENOUS | Status: AC
  Filled 2013-02-17: qty 5

## 2013-02-17 MED ORDER — VITAMINS A & D EX OINT
TOPICAL_OINTMENT | CUTANEOUS | Status: AC
  Administered 2013-02-17: 11:00:00
  Filled 2013-02-17: qty 5

## 2013-02-17 MED ORDER — PROPOFOL 10 MG/ML IV BOLUS
INTRAVENOUS | Status: AC
  Filled 2013-02-17: qty 20

## 2013-02-17 MED ORDER — HYDROMORPHONE HCL PF 1 MG/ML IJ SOLN
INTRAMUSCULAR | Status: AC
  Filled 2013-02-17: qty 1

## 2013-02-17 MED ORDER — DEXAMETHASONE SODIUM PHOSPHATE 4 MG/ML IJ SOLN
INTRAMUSCULAR | Status: DC | PRN
  Administered 2013-02-17: 10 mg via INTRAVENOUS

## 2013-02-17 MED ORDER — PROMETHAZINE HCL 25 MG/ML IJ SOLN: 6.2500 mg | INTRAMUSCULAR | Status: DC | PRN

## 2013-02-17 MED ORDER — 0.9 % SODIUM CHLORIDE (POUR BTL) OPTIME
TOPICAL | Status: DC | PRN
  Administered 2013-02-17: 1000 mL

## 2013-02-17 MED ORDER — PHENYLEPHRINE HCL 10 MG/ML IJ SOLN
INTRAMUSCULAR | Status: DC | PRN
  Administered 2013-02-17: 40 ug via INTRAVENOUS
  Administered 2013-02-17: 80 ug via INTRAVENOUS

## 2013-02-17 MED ORDER — FENTANYL CITRATE 0.05 MG/ML IJ SOLN
INTRAMUSCULAR | Status: DC | PRN
Start: 2013-02-17 — End: 2013-02-17
  Administered 2013-02-17 (×5): 50 ug via INTRAVENOUS

## 2013-02-17 MED ORDER — ROCURONIUM BROMIDE 100 MG/10ML IV SOLN
INTRAVENOUS | Status: AC
Start: 2013-02-17 — End: 2013-02-17
  Filled 2013-02-17: qty 1

## 2013-02-17 MED ORDER — LACTATED RINGERS IR SOLN
Status: DC | PRN
  Administered 2013-02-17: 1

## 2013-02-17 MED ORDER — ONDANSETRON HCL 4 MG/2ML IJ SOLN
INTRAMUSCULAR | Status: AC
Start: 2013-02-17 — End: 2013-02-17
  Filled 2013-02-17: qty 2

## 2013-02-17 MED ORDER — HEPARIN (PORCINE) IN NACL 100-0.45 UNIT/ML-% IJ SOLN
950.0000 [IU]/h | INTRAMUSCULAR | Status: DC
  Administered 2013-02-17: 18:00:00 950 [IU]/h via INTRAVENOUS
  Filled 2013-02-17: qty 250

## 2013-02-17 MED ORDER — FENTANYL CITRATE 0.05 MG/ML IJ SOLN
INTRAMUSCULAR | Status: AC
  Filled 2013-02-17: qty 5

## 2013-02-17 MED ORDER — HYDROMORPHONE HCL PF 1 MG/ML IJ SOLN
0.2500 mg | INTRAMUSCULAR | Status: DC | PRN
  Administered 2013-02-17 (×4): 0.5 mg via INTRAVENOUS

## 2013-02-17 MED ORDER — PROPOFOL 10 MG/ML IV BOLUS
INTRAVENOUS | Status: DC | PRN
  Administered 2013-02-17: 90 mg via INTRAVENOUS

## 2013-02-17 MED ORDER — SUCCINYLCHOLINE CHLORIDE 20 MG/ML IJ SOLN
INTRAMUSCULAR | Status: DC | PRN
  Administered 2013-02-17: 100 mg via INTRAVENOUS

## 2013-02-17 MED ORDER — DEXAMETHASONE SODIUM PHOSPHATE 10 MG/ML IJ SOLN
INTRAMUSCULAR | Status: AC
Start: 2013-02-17 — End: 2013-02-17
  Filled 2013-02-17: qty 1

## 2013-02-17 MED ORDER — NEOSTIGMINE METHYLSULFATE 1 MG/ML IJ SOLN
INTRAMUSCULAR | Status: AC
  Filled 2013-02-17: qty 10

## 2013-02-17 MED ORDER — METOCLOPRAMIDE HCL 5 MG/ML IJ SOLN
INTRAMUSCULAR | Status: AC
Start: 2013-02-17 — End: 2013-02-17
  Filled 2013-02-17: qty 2

## 2013-02-17 MED ORDER — LACTATED RINGERS IV SOLN
INTRAVENOUS | Status: DC | PRN
  Administered 2013-02-17 (×2): via INTRAVENOUS

## 2013-02-17 MED ORDER — IOHEXOL 300 MG/ML  SOLN
INTRAMUSCULAR | Status: DC | PRN
  Administered 2013-02-17: 09:00:00 8 mL via INTRAVENOUS

## 2013-02-17 MED ORDER — EPHEDRINE SULFATE 50 MG/ML IJ SOLN
INTRAMUSCULAR | Status: DC | PRN
  Administered 2013-02-17: 10 mg via INTRAVENOUS
  Administered 2013-02-17: 5 mg via INTRAVENOUS

## 2013-02-17 SURGICAL SUPPLY — 38 items
APPLIER CLIP ROT 10 11.4 M/L (STAPLE) ×3
BENZOIN TINCTURE PRP APPL 2/3 (GAUZE/BANDAGES/DRESSINGS) IMPLANT
CABLE HIGH FREQUENCY MONO STRZ (ELECTRODE) IMPLANT
CANISTER SUCTION 2500CC (MISCELLANEOUS) ×3 IMPLANT
CATH REDDICK CHOLANGI 4FR 50CM (CATHETERS) ×3 IMPLANT
CLIP APPLIE ROT 10 11.4 M/L (STAPLE) ×1 IMPLANT
CLOSURE WOUND 1/2 X4 (GAUZE/BANDAGES/DRESSINGS)
COVER MAYO STAND STRL (DRAPES) ×3 IMPLANT
DECANTER SPIKE VIAL GLASS SM (MISCELLANEOUS) ×3 IMPLANT
DERMABOND ADVANCED (GAUZE/BANDAGES/DRESSINGS) ×2
DERMABOND ADVANCED .7 DNX12 (GAUZE/BANDAGES/DRESSINGS) ×1 IMPLANT
DEVICE TROCAR PUNCTURE CLOSURE (ENDOMECHANICALS) ×3 IMPLANT
DRAPE C-ARM 42X120 X-RAY (DRAPES) ×3 IMPLANT
DRAPE LAPAROSCOPIC ABDOMINAL (DRAPES) ×3 IMPLANT
ELECT REM PT RETURN 9FT ADLT (ELECTROSURGICAL) ×3
ELECTRODE REM PT RTRN 9FT ADLT (ELECTROSURGICAL) ×1 IMPLANT
GLOVE BIOGEL M 8.0 STRL (GLOVE) ×3 IMPLANT
GOWN STRL REUS W/TWL XL LVL3 (GOWN DISPOSABLE) ×6 IMPLANT
HEMOSTAT SURGICEL 4X8 (HEMOSTASIS) IMPLANT
IV CATH 14GX2 1/4 (CATHETERS) ×3 IMPLANT
KIT BASIN OR (CUSTOM PROCEDURE TRAY) ×3 IMPLANT
NS IRRIG 1000ML POUR BTL (IV SOLUTION) ×3 IMPLANT
POUCH SPECIMEN RETRIEVAL 10MM (ENDOMECHANICALS) ×3 IMPLANT
SCISSORS LAP 5X45 EPIX DISP (ENDOMECHANICALS) ×3 IMPLANT
SET IRRIG TUBING LAPAROSCOPIC (IRRIGATION / IRRIGATOR) ×3 IMPLANT
SLEEVE XCEL OPT CAN 5 100 (ENDOMECHANICALS) ×9 IMPLANT
SOLUTION ANTI FOG 6CC (MISCELLANEOUS) ×3 IMPLANT
STRIP CLOSURE SKIN 1/2X4 (GAUZE/BANDAGES/DRESSINGS) IMPLANT
SUT VIC AB 4-0 SH 18 (SUTURE) ×3 IMPLANT
SYR 20CC LL (SYRINGE) ×3 IMPLANT
TOWEL OR 17X26 10 PK STRL BLUE (TOWEL DISPOSABLE) ×3 IMPLANT
TOWEL OR NON WOVEN STRL DISP B (DISPOSABLE) ×3 IMPLANT
TRAY LAP CHOLE (CUSTOM PROCEDURE TRAY) ×3 IMPLANT
TROCAR BLADELESS OPT 5 100 (ENDOMECHANICALS) ×3 IMPLANT
TROCAR XCEL 12X100 BLDLESS (ENDOMECHANICALS) ×3 IMPLANT
TROCAR XCEL BLUNT TIP 100MML (ENDOMECHANICALS) IMPLANT
TROCAR XCEL NON-BLD 11X100MML (ENDOMECHANICALS) ×3 IMPLANT
TUBING INSUFFLATION 10FT LAP (TUBING) ×3 IMPLANT

## 2013-02-17 NOTE — Anesthesia Preprocedure Evaluation (Signed)
Anesthesia Evaluation  Patient identified by MRN, date of birth, ID band Patient awake    Reviewed: Allergy & Precautions, H&P , NPO status , Patient's Chart, lab work & pertinent test results  Airway Mallampati: II TM Distance: >3 FB Neck ROM: Full    Dental no notable dental hx.    Pulmonary former smoker,  breath sounds clear to auscultation  Pulmonary exam normal       Cardiovascular + dysrhythmias Atrial Fibrillation Rhythm:Regular Rate:Normal  ECG: Afib, LBBB   Neuro/Psych negative neurological ROS  negative psych ROS   GI/Hepatic negative GI ROS, Neg liver ROS,   Endo/Other  negative endocrine ROS  Renal/GU negative Renal ROS  negative genitourinary   Musculoskeletal negative musculoskeletal ROS (+)   Abdominal   Peds negative pediatric ROS (+)  Hematology negative hematology ROS (+)   Anesthesia Other Findings   Reproductive/Obstetrics negative OB ROS                           Anesthesia Physical Anesthesia Plan  ASA: III  Anesthesia Plan: General   Post-op Pain Management:    Induction: Intravenous  Airway Management Planned: Oral ETT  Additional Equipment:   Intra-op Plan:   Post-operative Plan: Extubation in OR  Informed Consent: I have reviewed the patients History and Physical, chart, labs and discussed the procedure including the risks, benefits and alternatives for the proposed anesthesia with the patient or authorized representative who has indicated his/her understanding and acceptance.   Dental advisory given  Plan Discussed with: CRNA  Anesthesia Plan Comments:         Anesthesia Quick Evaluation

## 2013-02-17 NOTE — Preoperative (Signed)
Beta Blockers   Reason not to administer Beta Blockers:Not Applicable, took BB 6/78/93 2103

## 2013-02-17 NOTE — Transfer of Care (Signed)
Immediate Anesthesia Transfer of Care Note  Patient: Michael Mejia  Procedure(s) Performed: Procedure(s): LAPAROSCOPIC CHOLECYSTECTOMY WITH INTRAOPERATIVE CHOLANGIOGRAM (N/A)  Patient Location: PACU  Anesthesia Type:General  Level of Consciousness: Patient easily awoken, sedated, comfortable, cooperative, following commands, responds to stimulation.   Airway & Oxygen Therapy: Patient spontaneously breathing, ventilating well, oxygen via simple oxygen mask.  Post-op Assessment: Report given to PACU RN, vital signs reviewed and stable, moving all extremities.   Post vital signs: Reviewed and stable.  Complications: No apparent anesthesia complications

## 2013-02-17 NOTE — Addendum Note (Signed)
Addendum created 02/17/13 1145 by Isa Rankin, CRNA   Modules edited: Anesthesia LDA

## 2013-02-17 NOTE — Progress Notes (Signed)
Pt back to room from sx, no c/o, drowsy.  Family at bedside

## 2013-02-17 NOTE — Anesthesia Postprocedure Evaluation (Signed)
  Anesthesia Post-op Note  Patient: Michael Mejia  Procedure(s) Performed: Procedure(s) (LRB): LAPAROSCOPIC CHOLECYSTECTOMY WITH INTRAOPERATIVE CHOLANGIOGRAM (N/A)  Patient Location: PACU  Anesthesia Type: General  Level of Consciousness: awake and alert   Airway and Oxygen Therapy: Patient Spontanous Breathing  Post-op Pain: mild  Post-op Assessment: Post-op Vital signs reviewed, Patient's Cardiovascular Status Stable, Respiratory Function Stable, Patent Airway and No signs of Nausea or vomiting  Last Vitals:  Filed Vitals:   02/17/13 1020  BP: 125/70  Pulse: 77  Temp:   Resp: 11    Post-op Vital Signs: stable   Complications: No apparent anesthesia complications

## 2013-02-17 NOTE — Progress Notes (Signed)
[facility] PROGRESS NOTE  ROBIE MCNIEL ATF:[id number] DOB: 1941/01/11 DOA: 02/13/2013 PCP: Vena Austria, MD  Assessment/Plan: Atrial fibrillation  - off of xarelto surgery to clarify when patient can resume.  - rate controlled with metoprolol 25 mg PO BID  - Heparin while patient is off xarelto.  To be started later this evening per surgery recommendations.  BPH  - continue flomax   Dyslipidemia  - continue statin therapy  -Stable  Hypertension  - metoprolol 25 mg BID  Relatively well controlled on this regimen  Code Status: full Family Communication: No family at bedside Disposition Plan: Once cleared by surgical team may go home oh home regimen for known medical chronic problems listed in the PMH   Procedures:  Pending  Antibiotics:  Zosyn  HPI/Subjective: Pt was just back from surgery. No new complaints. No acute issues reported overnight.  Objective: Filed Vitals:   02/17/13 1032  BP: 144/79  Pulse: 74  Temp: 98.7 F (37.1 C)  Resp: 13    Intake/Output Summary (Last 24 hours) at 02/17/13 1401 Last data filed at 02/17/13 1021  Gross per 24 hour  Intake 1957.83 ml  Output      0 ml  Net 1957.83 ml   Filed Weights   02/13/13 0342 02/13/13 1247  Weight: 88.905 kg (196 lb) 87.9 kg (193 lb 12.6 oz)    Exam:   General:  Pt in NAD, Alert and awake  Cardiovascular: irregularly irregular, rate WNL, no murmurs  Respiratory: CTA BL, no wheezes  Abdomen: DN, soft  Musculoskeletal: no cyanosis or clubbing   Data Reviewed: Basic Metabolic Panel:  Recent Labs Lab 02/13/13 0351 02/14/13 0025 02/15/13 0811 02/16/13 0412 02/17/13 0449  NA 140 135* 136* 138 139  K 4.5 4.1 4.2 4.3 4.1  CL 101 97 100 100 102  CO2 26 27 24 27 26   GLUCOSE 157* 129* 142* 110* 106*  BUN 20 18 15 12 13   CREATININE 1.22 1.52* 1.42* 1.34 1.40*  CALCIUM 9.5 8.6 8.8 8.5 9.0   Liver Function Tests:  Recent Labs Lab 02/13/13 0351 02/14/13 0025  02/15/13 0811 02/16/13 0412 02/17/13 0449  AST 273* 221* 83* 58* 68*  ALT 105* 200* 119* 88* 80*  ALKPHOS 78 86 117 145* 142*  BILITOT 2.4* 6.2* 4.4* 3.4* 1.8*  PROT 7.3 6.4 6.8 6.6 6.9  ALBUMIN 3.8 3.1* 3.1* 3.0* 3.0*    Recent Labs Lab 02/13/13 0351 02/14/13 0025 02/16/13 0412  LIPASE 29 15 18    No results found for this basename: AMMONIA,  in the last 168 hours CBC:  Recent Labs Lab 02/13/13 0351 02/15/13 0514 02/16/13 0412 02/17/13 0449  WBC 8.9 3.9* 4.8 3.8*  NEUTROABS 7.6  --  3.0  --   HGB 15.3 13.1 13.1 14.2  HCT 44.2 38.7* 39.1 41.4  MCV 88.2 88.2 89.3 87.5  PLT 164 134* 132* 148*   Cardiac Enzymes: No results found for this basename: CKTOTAL, CKMB, CKMBINDEX, TROPONINI,  in the last 168 hours BNP (last 3 results) No results found for this basename: PROBNP,  in the last 8760 hours CBG: No results found for this basename: GLUCAP,  in the last 168 hours  Recent Results (from the past 240 hour(s))  SURGICAL PCR SCREEN     Status: None   Collection Time    02/17/13 12:16 AM      Result Value Range Status   MRSA, PCR NEGATIVE  NEGATIVE Final   Staphylococcus aureus NEGATIVE  NEGATIVE Final  Comment:            The Xpert SA Assay (FDA     approved for NASAL specimens     in patients over 72 years of age),     is one component of     a comprehensive surveillance     program.  Test performance has     been validated by [employer] for patients greater     than or equal to 72 year old.     It is not intended     to diagnose infection nor to     guide or monitor treatment.     Studies: Dg Cholangiogram Operative  02/17/2013   CLINICAL DATA:  Cholecystectomy  EXAM: INTRAOPERATIVE CHOLANGIOGRAM  TECHNIQUE: Cholangiographic images from the C-arm fluoroscopic device were submitted for interpretation post-operatively. Please see the procedural report for the amount of contrast and the fluoroscopy time utilized.  COMPARISON:  02/14/2013  FINDINGS:  Contrast is injected through the cystic duct remnant. There is some leak or reflux back into the gallbladder if that has not yet been removed. There are small filling defects in the cystic duct consistent with small stones. These could possibly be small air bubbles. There are small filling defects in the distal common bile duct consistent with small stones. There is no obstruction as contrast does pass into the intestine.  IMPRESSION: Small filling defects in the cystic duct and common bile duct without obstruction. In correlation with the previous MRI, these could represent small stones. Air bubbles are not excluded. Contrast is seen to reflux back into the gallbladder if this has not been removed. If it has, there is some leak.   Electronically Signed   By: Nelson Chimes M.D.   On: 02/17/2013 10:37    Scheduled Meds: . HYDROmorphone      . HYDROmorphone      . metoprolol  25 mg Oral BID  . piperacillin-tazobactam (ZOSYN)  IV  3.375 g Intravenous Q8H  . simvastatin  40 mg Oral QPM  . tamsulosin  0.4 mg Oral QPM   Continuous Infusions: . heparin      Active Problems:   Atrial fibrillation, not on coumadin   Lap chole Jan 2015    Time spent: >20 minutes    Codylee Patil, [facility] Pager [phone]. If 7PM-7AM, please contact night-coverage at [email or web address], password [facility] 02/17/2013, 2:01 PM  LOS: 4 days

## 2013-02-17 NOTE — Op Note (Signed)
Michael Mejia @date @  Procedure: Laparoscopic Cholecystectomy with intraoperative cholangiogram  Surgeon: Kaylyn Lim, MD, FACS Asst:  Jackolyn Confer, MD, FACS  Anes:  General  Drains: None  Findings: Chronic cholecystitis, nl IOC  Description of Procedure: The patient was taken to OR 1 and given general anesthesia.  The patient was prepped with PCMX and draped sterilely. A time out was performed.  Access to the abdomen was achieved with a 5 mm Optiview through the right upper quadrant without difficulty.  A second 5 mm trocar was placed inferiorraly and laterally and this was used to take down adhesions in the midline from his prior laparotomy.  Port placement included a 12 in the upper midline.    The gallbladder was visualized and the fundus was grasped and the gallbladder was elevated. Traction on the infundibulum allowed for successful demonstration of the critical view. Inflammatory changes were this and easily taken down.  The cystic duct was identified and clipped up on the gallbladder and an incision was made in the cystic duct and the Reddick catheter was inserted after milking the cystic duct of any debris. A dynamic cholangiogram was performed which demonstrated flow into the duodenum and intrahepatic filling.    The cystic duct was then triple clipped and divided, the cystic artery was double clipped and divided and then the gallbladder was removed from the gallbladder bed. Removal of the gallbladder from the gallbladder bed was straightforward and vessels along the way were Bovied or clipped.  The gallbladder was then placed in a bag and brought out through the 12 mm trocar sites. The gallbladder bed was inspected and no bleeding or bile leaks were seen.   Laparoscopic visualization was used when closing fascial defects for trocar sites at the umbilicus where a small hernia was noted.   Incisions were injected with Exaprel and closed with 4-0 Vicryl and Dermabond on the skin.  Sponge  and needle count were correct.    The patient was taken to the recovery room in satisfactory condition.

## 2013-02-17 NOTE — Progress Notes (Signed)
ANTICOAGULATION CONSULT NOTE - Follow Up Consult  Pharmacy Consult for Heparin IV Indication: atrial fibrillation  Allergies  Allergen Reactions  . Codeine Nausea Only  . Lactose Intolerance (Gi) Other (See Comments)    Gas pain    Patient Measurements: Height: 5\' 9"  (175.3 cm) Weight: 193 lb 12.6 oz (87.9 kg) IBW/kg (Calculated) : 70.7  Vital Signs: Temp: 97.8 F (36.6 C) (01/11 0926) Temp src: Oral (01/11 0528) BP: 135/80 mmHg (01/11 0945) Pulse Rate: 85 (01/11 0950)  Labs:  Recent Labs  02/15/13 0514  02/15/13 0811 02/16/13 0412 02/16/13 1249 02/16/13 2209 02/17/13 0449  HGB 13.1  --   --  13.1  --   --  14.2  HCT 38.7*  --   --  39.1  --   --  41.4  PLT 134*  --   --  132*  --   --  148*  HEPARINUNFRC  --   < >  --  0.31 0.16* 0.37  --   CREATININE  --   --  1.42* 1.34  --   --  1.40*  < > = values in this interval not displayed.  Estimated Creatinine Clearance: 52.3 ml/min (by C-G formula based on Cr of 1.4).   Medications:  Scheduled:  . HYDROmorphone      . HYDROmorphone      . metoprolol  25 mg Oral BID  . piperacillin-tazobactam (ZOSYN)  IV  3.375 g Intravenous Q8H  . simvastatin  40 mg Oral QPM  . tamsulosin  0.4 mg Oral QPM   Infusions:    Assessment: 62 yoM admitted 1/7 with acute cholecystitis with cholelithiasis.  Chronic Xarelto for hx of Afib was held on admission and pharmacy consulted to dose IV heparin as bridge for surgery.  Heparin initially started on 1/7.  She is now s/p ERCP on 1/9 and s/p laparoscopic cholecystectomy on 1/11.    Heparin held at 0000 (midnight) on 1/11 for surgery today.  Pharmacy consulted to resume IV heparin tonight at 6pm.  Heparin will have been off ~ 18 hours total.  Previously therapeutic heparin level (0.37) last night on heparin 950 units/hr.  CBC: Hgb 14.2 and Plt 488  No complications or bleeding noted/reported.  Goal of Therapy:  Heparin level 0.3-0.7 units/ml Monitor platelets by anticoagulation  protocol: Yes   Plan:   Restart heparin IV infusion at 950 units/hr (9.5 ml/hr)  No bolus due to recent surgery  Heparin level 8 hours after restarting  Daily heparin level and CBC  Continue to monitor H&H and platelets  Follow up plans to resume Xarelto when appropriate.  Gretta Arab PharmD, BCPS Pager [phone] 02/17/2013 10:17 AM

## 2013-02-18 ENCOUNTER — Encounter (HOSPITAL_COMMUNITY): Payer: Self-pay | Admitting: Surgery

## 2013-02-18 LAB — COMPREHENSIVE METABOLIC PANEL
ALT: 85 U/L — ABNORMAL HIGH (ref 0–53)
AST: 83 U/L — AB (ref 0–37)
Albumin: 3 g/dL — ABNORMAL LOW (ref 3.5–5.2)
Alkaline Phosphatase: 117 U/L (ref 39–117)
BILIRUBIN TOTAL: 1.2 mg/dL (ref 0.3–1.2)
BUN: 16 mg/dL (ref 6–23)
CALCIUM: 9.2 mg/dL (ref 8.4–10.5)
CO2: 26 mEq/L (ref 19–32)
CREATININE: 1.2 mg/dL (ref 0.50–1.35)
Chloride: 97 mEq/L (ref 96–112)
GFR, EST AFRICAN AMERICAN: 68 mL/min — AB (ref >90)
GFR, EST NON AFRICAN AMERICAN: 59 mL/min — AB (ref >90)
GLUCOSE: 176 mg/dL — AB (ref 70–99)
Potassium: 4.2 mEq/L (ref 3.7–5.3)
Sodium: 135 mEq/L — ABNORMAL LOW (ref 137–147)
Total Protein: 6.8 g/dL (ref 6.0–8.3)

## 2013-02-18 LAB — CBC
HEMATOCRIT: 38.8 % — AB (ref 39.0–52.0)
Hemoglobin: 13.1 g/dL (ref 13.0–17.0)
MCH: 29.3 pg (ref 26.0–34.0)
MCHC: 33.8 g/dL (ref 30.0–36.0)
MCV: 86.8 fL (ref 78.0–100.0)
PLATELETS: 152 10*3/uL (ref 150–400)
RBC: 4.47 MIL/uL (ref 4.22–5.81)
RDW: 13.3 % (ref 11.5–15.5)
WBC: 6.2 10*3/uL (ref 4.0–10.5)

## 2013-02-18 LAB — HEPARIN LEVEL (UNFRACTIONATED): HEPARIN UNFRACTIONATED: 0.29 [IU]/mL — AB (ref 0.30–0.70)

## 2013-02-18 MED ORDER — ACETAMINOPHEN 325 MG PO TABS: 650.0000 mg | ORAL_TABLET | Freq: Four times a day (QID) | ORAL | Status: DC | PRN

## 2013-02-18 MED ORDER — RIVAROXABAN 20 MG PO TABS
20.0000 mg | ORAL_TABLET | Freq: Once | ORAL | Status: AC
  Administered 2013-02-18: 11:00:00 20 mg via ORAL
  Filled 2013-02-18: qty 1

## 2013-02-18 MED ORDER — HEPARIN (PORCINE) IN NACL 100-0.45 UNIT/ML-% IJ SOLN
1000.0000 [IU]/h | INTRAMUSCULAR | Status: DC
  Administered 2013-02-18: 1000 [IU]/h via INTRAVENOUS

## 2013-02-18 MED ORDER — RIVAROXABAN 20 MG PO TABS: 20.0000 mg | ORAL_TABLET | Freq: Every day | ORAL | Status: DC

## 2013-02-18 MED ORDER — OXYCODONE-ACETAMINOPHEN 5-325 MG PO TABS: 1.0000 | ORAL_TABLET | ORAL | Status: DC | PRN

## 2013-02-18 NOTE — Progress Notes (Signed)
[facility] PROGRESS NOTE  Michael Mejia RUE:[id number] DOB: October 08, 1940 DOA: 02/13/2013 PCP: Vena Austria, MD  Assessment/Plan: Atrial fibrillation  - off of xarelto surgery to clarify when patient can resume.  - rate controlled with metoprolol 25 mg PO BID  - Once ready to transition home would continue home regimen of xarelto.  BPH  - continue flomax   Dyslipidemia  - continue statin therapy  -Stable  Hypertension  - metoprolol 25 mg BID  Relatively well controlled on this regimen  Code Status: full Family Communication: No family at bedside Disposition Plan: Once cleared by surgical team may go home oh home regimen for known medical chronic problems listed in the PMH   Procedures:  Pending  Antibiotics:  Zosyn  HPI/Subjective: Pt states that he has been eating. No new complaints. No acute issues reported overnight to me from patient.  Objective: Filed Vitals:   02/18/13 0853  BP: 105/58  Pulse: 62  Temp: 97.9 F (36.6 C)  Resp: 20    Intake/Output Summary (Last 24 hours) at 02/18/13 1004 Last data filed at 02/18/13 1478  Gross per 24 hour  Intake  911.6 ml  Output    400 ml  Net  511.6 ml   Filed Weights   02/13/13 0342 02/13/13 1247  Weight: 88.905 kg (196 lb) 87.9 kg (193 lb 12.6 oz)    Exam:   General:  Pt in NAD, Alert and awake  Cardiovascular: irregularly irregular, rate WNL, no murmurs  Respiratory: CTA BL, no wheezes  Abdomen: DN, soft  Musculoskeletal: no cyanosis or clubbing   Data Reviewed: Basic Metabolic Panel:  Recent Labs Lab 02/14/13 0025 02/15/13 0811 02/16/13 0412 02/17/13 0449 02/18/13 0236  NA 135* 136* 138 139 135*  K 4.1 4.2 4.3 4.1 4.2  CL 97 100 100 102 97  CO2 27 24 27 26 26   GLUCOSE 129* 142* 110* 106* 176*  BUN 18 15 12 13 16   CREATININE 1.52* 1.42* 1.34 1.40* 1.20  CALCIUM 8.6 8.8 8.5 9.0 9.2   Liver Function Tests:  Recent Labs Lab 02/14/13 0025 02/15/13 0811 02/16/13 0412  02/17/13 0449 02/18/13 0236  AST 221* 83* 58* 68* 83*  ALT 200* 119* 88* 80* 85*  ALKPHOS 86 117 145* 142* 117  BILITOT 6.2* 4.4* 3.4* 1.8* 1.2  PROT 6.4 6.8 6.6 6.9 6.8  ALBUMIN 3.1* 3.1* 3.0* 3.0* 3.0*    Recent Labs Lab 02/13/13 0351 02/14/13 0025 02/16/13 0412  LIPASE 29 15 18    No results found for this basename: AMMONIA,  in the last 168 hours CBC:  Recent Labs Lab 02/13/13 0351 02/15/13 0514 02/16/13 0412 02/17/13 0449 02/18/13 0236  WBC 8.9 3.9* 4.8 3.8* 6.2  NEUTROABS 7.6  --  3.0  --   --   HGB 15.3 13.1 13.1 14.2 13.1  HCT 44.2 38.7* 39.1 41.4 38.8*  MCV 88.2 88.2 89.3 87.5 86.8  PLT 164 134* 132* 148* 152   Cardiac Enzymes: No results found for this basename: CKTOTAL, CKMB, CKMBINDEX, TROPONINI,  in the last 168 hours BNP (last 3 results) No results found for this basename: PROBNP,  in the last 8760 hours CBG: No results found for this basename: GLUCAP,  in the last 168 hours  Recent Results (from the past 240 hour(s))  SURGICAL PCR SCREEN     Status: None   Collection Time    02/17/13 12:16 AM      Result Value Range Status   MRSA, PCR NEGATIVE  NEGATIVE  Final   Staphylococcus aureus NEGATIVE  NEGATIVE Final   Comment:            The Xpert SA Assay (FDA     approved for NASAL specimens     in patients over 28 years of age),     is one component of     a comprehensive surveillance     program.  Test performance has     been validated by [employer] for patients greater     than or equal to 64 year old.     It is not intended     to diagnose infection nor to     guide or monitor treatment.     Studies: Dg Cholangiogram Operative  02/17/2013   CLINICAL DATA:  Cholecystectomy  EXAM: INTRAOPERATIVE CHOLANGIOGRAM  TECHNIQUE: Cholangiographic images from the C-arm fluoroscopic device were submitted for interpretation post-operatively. Please see the procedural report for the amount of contrast and the fluoroscopy time utilized.  COMPARISON:   02/14/2013  FINDINGS: Contrast is injected through the cystic duct remnant. There is some leak or reflux back into the gallbladder if that has not yet been removed. There are small filling defects in the cystic duct consistent with small stones. These could possibly be small air bubbles. There are small filling defects in the distal common bile duct consistent with small stones. There is no obstruction as contrast does pass into the intestine.  IMPRESSION: Small filling defects in the cystic duct and common bile duct without obstruction. In correlation with the previous MRI, these could represent small stones. Air bubbles are not excluded. Contrast is seen to reflux back into the gallbladder if this has not been removed. If it has, there is some leak.   Electronically Signed   By: Nelson Chimes M.D.   On: 02/17/2013 10:37    Scheduled Meds: . metoprolol  25 mg Oral BID  . piperacillin-tazobactam (ZOSYN)  IV  3.375 g Intravenous Q8H  . simvastatin  40 mg Oral QPM  . tamsulosin  0.4 mg Oral QPM   Continuous Infusions: . heparin 1,000 Units/hr (02/18/13 0428)    Active Problems:   Atrial fibrillation, not on coumadin   Lap chole Jan 2015    Time spent: >20 minutes    Velvet Bathe  [facility] Pager [phone]. If 7PM-7AM, please contact night-coverage at [email or web address], password [facility] 02/18/2013, 10:04 AM  LOS: 5 days

## 2013-02-18 NOTE — Discharge Instructions (Signed)
Endoscopic Retrograde Cholangiopancreatography (ERCP) Endoscopic retrograde cholangiopancreatography (ERCP) is a procedure used to diagnosis many diseases of the pancreas, bile ducts, liver, and gallbladder. During ERCP a thin, lighted tube (endoscope) is passed through the mouth and down the back of the throat into the first part of the small intestine (duodenum). A small, plastic tube (cannula) is then passed through the endoscope and directed into the bile duct or pancreatic duct. Dye is then injected through the cannula and X-rays are taken to study the biliary and pancreatic passageways.  LET [facility] CARE PROVIDER KNOW ABOUT:   Any allergies you have.   All medicines you are taking, including vitamins, herbs, eyedrops, creams, and over-the-counter medicines.   Previous problems you or members of your family have had with the use of anesthetics.   Any blood disorders you have.   Previous surgeries you have had.   Medical conditions you have. RISKS AND COMPLICATIONS Generally, ERCP is a safe procedure. However, as with any procedure, complications can occur. A simple removal of gallstones has the lowest rate of complications. Higher rates of complication occur in people who have poorly functioning bile or pancreatic ducts. Possible complications include:   Pancreatitis.  Bleeding.  Accidental punctures in the bowel wall, pancreas, or gall bladder.  Gall bladder or bile duct infection. BEFORE THE PROCEDURE   Do not eat or drink anything, including water, for at least 8 hours before the procedure or as directed by your health care provider.   Ask your health care provider whether you should stop taking certain medicines prior to your procedure.   Arrange for someone to drive you home. You will not be allowed to drive for 12 24 hours after the procedure. PROCEDURE   You will be given medicine through a vein (intravenously) to make you relaxed and sleepy.   You might  have a breathing tube placed to give you medicine that makes you sleep (general anesthetic).   Your throat may be sprayed with medicine that numbs the area and prevents gagging (local anesthetic), or you may gargle this medicine.   You will lie on your left side.   The endoscope will be inserted through your mouth and into the duodenum. The tube will not interfere with your breathing. Gagging is prevented by the anesthesia.   While X-rays are being taken, you may be positioned on your stomach.   A small sample of tissue (biopsy) may be removed for examination. AFTER THE PROCEDURE   You will rest in bed until you are fully conscious.   When you first wake up, your throat may feel slightly sore.   You will not be allowed to eat or drink until numbness subsides.   Once you are able to drink, urinate, and sit on the edge of the bed without feeling sick to your stomach (nauseous) or dizzy, you may be allowed to go home. Document Released: 10/19/2000 Document Revised: 11/14/2012 Document Reviewed: 09/04/2012 ExitCare Patient Information 2014 ExitCare, [facility]  Laparoscopic Cholecystectomy Laparoscopic cholecystectomy is surgery to remove the gallbladder. The gallbladder is located in the upper right part of the abdomen, behind the liver. It is a storage sac for bile produced in the liver. Bile aids in the digestion and absorption of fats. Cholecystectomy is often done for inflammation of the gallbladder (cholecystitis). This condition is usually caused by a buildup of gallstones (cholelithiasis) in your gallbladder. Gallstones can block the flow of bile, resulting in inflammation and pain. In severe cases, emergency surgery may be  required. When emergency surgery is not required, you will have time to prepare for the procedure. Laparoscopic surgery is an alternative to open surgery. Laparoscopic surgery has a shorter recovery time. Your common bile duct may also need to be examined during  the procedure. If stones are found in the common bile duct, they may be removed. LET [facility] CARE PROVIDER KNOW ABOUT:  Any allergies you have.  All medicines you are taking, including vitamins, herbs, eye drops, creams, and over-the-counter medicines.  Previous problems you or members of your family have had with the use of anesthetics.  Any blood disorders you have.  Previous surgeries you have had.  Medical conditions you have. RISKS AND COMPLICATIONS Generally, this is a safe procedure. However, as with any procedure, complications can occur. Possible complications include:  Infection.  Damage to the common bile duct, nerves, arteries, veins, or other internal organs such as the stomach, liver, or intestines.  Bleeding.  A stone may remain in the common bile duct.  A bile leak from the cyst duct that is clipped when your gallbladder is removed.  The need to convert to open surgery, which requires a larger incision in the abdomen. This may be necessary if your surgeon thinks it is not safe to continue with a laparoscopic procedure. BEFORE THE PROCEDURE  Ask your health care provider about changing or stopping any regular medicines. You will need to stop taking aspirin or blood thinners at least 5 days prior to surgery.  Do not eat or drink anything after midnight the night before surgery.  Let your health care provider know if you develop a cold or other infectious problem before surgery. PROCEDURE   You will be given medicine to make you sleep through the procedure (general anesthetic). A breathing tube will be placed in your mouth.  When you are asleep, your surgeon will make several small cuts (incisions) in your abdomen.  A thin, lighted tube with a tiny camera on the end (laparoscope) is inserted through one of the small incisions. The camera on the laparoscope sends a picture to a TV screen in the operating room. This gives the surgeon a good view inside your  abdomen.  A gas will be pumped into your abdomen. This expands your abdomen so that the surgeon has more room to perform the surgery.  Other tools needed for the procedure are inserted through the other incisions. The gallbladder is removed through one of the incisions.  After the removal of your gallbladder, the incisions will be closed with stitches, staples, or skin glue. AFTER THE PROCEDURE  You will be taken to a recovery area where your progress will be checked often.  You may be allowed to go home the same day if your pain is controlled and you can tolerate liquids. Document Released: 01/24/2005 Document Revised: 11/14/2012 Document Reviewed: 09/05/2012 [facility] Patient Information 2014 [place].. CCS ______[facility] LAPAROSCOPIC SURGERY: POST OP INSTRUCTIONS Always review your discharge instruction sheet given to you by the facility where your surgery was performed. IF YOU HAVE DISABILITY OR FAMILY LEAVE FORMS, YOU MUST BRING THEM TO THE OFFICE FOR PROCESSING.   DO NOT GIVE THEM TO YOUR DOCTOR.  1. A prescription for pain medication may be given to you upon discharge.  Take your pain medication as prescribed, if needed.  If narcotic pain medicine is not needed, then you may take acetaminophen (Tylenol) or ibuprofen (Advil) as needed. 2. Take your usually prescribed medications unless otherwise directed.  3. If you need a refill on your pain medication, please contact your pharmacy.  They will contact our office to request authorization. Prescriptions will not be filled after 5pm or on week-ends. 4. You should follow a light diet the first few days after arrival home, such as soup and crackers, etc.  Be sure to include lots of fluids daily. 5. Most patients will experience some swelling and bruising in the area of the incisions.  Ice packs will help.  Swelling and bruising can take several days to resolve.  6. It is common to experience some constipation if  taking pain medication after surgery.  Increasing fluid intake and taking a stool softener (such as Colace) will usually help or prevent this problem from occurring.  A mild laxative (Milk of Magnesia or Miralax) should be taken according to package instructions if there are no bowel movements after 48 hours. 7. Unless discharge instructions indicate otherwise, you may remove your bandages 24-48 hours after surgery, and you may shower at that time.  You may have steri-strips (small skin tapes) in place directly over the incision.  These strips should be left on the skin for 7-10 days.  If your surgeon used skin glue on the incision, you may shower in 24 hours.  The glue will flake off over the next 2-3 weeks.  Any sutures or staples will be removed at the office during your follow-up visit. 8. ACTIVITIES:  You may resume regular (light) daily activities beginning the next day--such as daily self-care, walking, climbing stairs--gradually increasing activities as tolerated.  You may have sexual intercourse when it is comfortable.  Refrain from any heavy lifting or straining until approved by your doctor. a. You may drive when you are no longer taking prescription pain medication, you can comfortably wear a seatbelt, and you can safely maneuver your car and apply brakes. b. RETURN TO WORK:  __________________________________________________________ 9. You should see your doctor in the office for a follow-up appointment approximately 2-3 weeks after your surgery.  Make sure that you call for this appointment within a day or two after you arrive home to insure a convenient appointment time. 10. OTHER INSTRUCTIONS: __________________________________________________________________________________________________________________________ __________________________________________________________________________________________________________________________ WHEN TO CALL YOUR DOCTOR: 1. Fever over  101.0 2. Inability to urinate 3. Continued bleeding from incision. 4. Increased pain, redness, or drainage from the incision. 5. Increasing abdominal pain  The clinic staff is available to answer your questions during regular business hours.  Please dont hesitate to call and ask to speak to one of the nurses for clinical concerns.  If you have a medical emergency, go to the nearest emergency room or call 911.  A surgeon from [facility] is always on call at the hospital. [address], [place], [place], [place]  [address] ? [address], [place], [place]   [address] [phone] ? [phone] ? FAX [phone] Web site: [email or web address]

## 2013-02-18 NOTE — Discharge Summary (Signed)
Physician Discharge Summary  Patient ID: Michael Mejia MRN: [id number] DOB/AGE: July 24, 1940 73 y.o.  Admit date: 02/13/2013 Discharge date: 02/18/2013  Admission Diagnoses:   1. Acute cholecystitis with cholelithiasis  2. Evelvated LFT's 3. 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 S/p Exploratory laparotomy with resection of perforated small bowel diverticula with primary anastomosis, 10/23/12.  6. Hyperlipidemia.  7. BPH   Discharge Diagnoses:  1. Acute cholecystitis with cholelithiasis  2. Evelvated LFT's/MRCP shows possible small intraductal calculi/ERCP by Dr. Watt Climes 3. 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 S/p Exploratory laparotomy with resection of perforated small bowel diverticula with primary anastomosis, 10/23/12.  6. Hyperlipidemia.  7. BPH 8.  Mild renal insuffiencey now resolved.  Active Problems:   Atrial fibrillation, not on coumadin   Lap chole Jan 2015   PROCEDURES:  S/p Laparoscopic Cholecystectomy with intraoperative cholangiogram. 02/17/13  Dr. Johnathan Hausen S/p ERCP with removal of calculus/calculi and ERCP with sphincterotomy/papillotomy 02/15/13. Dr. Altamese Dilling [facility] Course: Pt doing well till last PM after eating cabbage rolls, with sausage and hamburger. Say about 11 PM he started having pain and burning sensation mid epigastric site. He tried Rolaids, beano, various positions to make symptoms better nothing worked. He had a BM and while trying various positions developed nausea and vomited. He felt better for a time after that and took a nap, but awoke again at [facility] with severe pain and burning mid abdomen. He came to the ER and pain was a little better here. Labs show a normal WBC but elevated bilirubin 2.4, elevated AST 273, ALT 105. Creatinine is borderline normal 1.22. Single view abdomen was normal, CT abdomen shows some ascites with some  pericholecystic fluid. SB anastomosis is normal with no SBO. US shows: Sludge in GB, GB wall thickening and pericholecystic fluid. We are ask to see.  He was admitted and started on antibiotics, his Xarelto was held and he was placed on heparin.  Medicine saw him and helped with care.  His creatinine went up as did his LFT's with a bilirubin up to 6.2.  GI saw him and he was taken to Endo lab for ERCP by Dr. Clarene Essex, 02/15/13.  His LFT's improved and he was taken to the OR on 02/1113 by Dr. Hassell Done.  He is doing very well the 1st post op day, BP was down some this AM.  We will finish IV anitbiotic hanging, mobilize more and if OK aim for dc later today.  We will have pharmacy restart his Xarelto and then stop heparin today also.   His renal function has returned to base line of 1.2 Follow up in 3 weeks at our clinic, sooner if he has an issue. Follow up with PCP and Dr. Wynonia Lawman at their discretion.  His BP is still a little low, 99/60 sitting and standing, walking 94/60.  HR 68 sitting up to 80 walking, and back down with rest to the 60's. I am going to let him go home and just take his lopressor at bedtime today and have ask his wife to Call if his HR is up or down, and his BP remains low.    Condition on d/c:  Improved Follow up as listed below.  138    139  135 Sodium   Potassium  4.2    4.3    4.1  4.2 Potassium   Chloride  100  100    102  97 Chloride   CO2  24    27    26  26  CO2   BUN  15    12    13  16  BUN   Creatinine, Ser  1.42    1.34    1.40  1.20 Creatinine, Ser   Calcium  8.8    8.5    9.0  9.2 Calcium   GFR calc non Af Amer  48    51    49  59 GFR calc non Af Amer   GFR calc Af Amer  55     59     56   68  GFR calc Af Amer   Glucose, Bld  142    110    106  176 Glucose, Bld   Alkaline Phosphatase  117    145    142  117 Alkaline Phosphatase   Albumin  3.1    3.0    3.0  3.0 Albumin   Lipase      18       Lipase   AST  83    58    68  83 AST   ALT  119    88    80  85 ALT    Total Protein  6.8    6.6    6.9  6.8 Total Protein   Total Bilirubin  4.4    3.4    1.8  1.2 Total Bilirubin  [id number]   CBC    WBC      4.8    3.8  6.2 WBC   RBC      4.38    4.73  4.47 RBC   Hemoglobin      13.1    14.2  13.1 Hemoglobin   HCT      39.1    41.4  38.8 HCT   MCV      89.3    87.5  86.8 MCV   MCH      29.9    30.0  29.3 MCH   MCHC      33.5    34.3  33.8 MCHC   RDW      13.7    13.5  13.3 RDW   Platelets      132    148  152 Platelets   DIFFERENTIAL    Neutrophils Relative %      61       Neutrophils Relative %   Lymphocytes Relative      26       Lymphocytes Relative   Monocytes Relative      11       Monocytes Relative   Eosinophils Relative      2       Eosinophils Relative   Basophils Relative      0       Basophils Relative   Neutro Abs      3.0       Neutro Abs   Lymphs Abs      1.2       Lymphs Abs   Monocytes Absolute      0.5       Monocytes Absolute   Eosinophils Absolute      0.1       Eosinophils Absolute   Basophils Absolute      0.0       Basophils Absolute   HEPARIN  STUDIES    Heparin Unfractionated 0.62      0.31  0.16  0.37     0.29  Heparin Unfractionated   DIABETES    Glucose, Bld  142    110    106  176 Glucose, Bld   BLOOD TYPE/TRANSFUSIONS    Sample Expiration    02/18/2013         Sample Expiration   Antibody Screen    NEG         Antibody Screen   ABO/RH(D)   A NEG A NEG         ABO/RH(D)   MICROBIOLOGY    MRSA, PCR         NEGATIVE    MRSA, PCR   Staphylococcus aureus         NEGATIVE     Staphylococcus aureus   GENERAL DIAGNOSTIC    DG CHOLANGIOGRAM OPERATIVE             DG CHOLANGIOGRAM OPERATIVE   DG ERCP WITH SPHINCTEROTOMY             DG ERCP WITH SPHINCTEROTOMY        Disposition: 06-Home-Health Care Svc   Future Appointments Provider Department Dept Phone   03/12/2013 2:45 PM [facility], [place] [phone]       Medication List         acetaminophen 325 MG tablet  Commonly known as:  TYLENOL  Take 2 tablets (650 mg total) by mouth every 6 (six) hours as needed for mild pain, moderate pain or fever (or Temp > 100).  Do not take more than 4000 mg of Tylenol/acetaminophen per 24 hour period.     metoprolol 50 MG tablet  Commonly known as:  LOPRESSOR  Take 25 mg by mouth 2 (two) times daily.  Today 02/18/13 take a single dose at bedtime.  If your heart rate is less than 60, or greater than 80, tomorrow AM; call Dr. Alyson Ingles or Wynonia Lawman tomorrow.  If your systolic blood pressure (top number)  is less than 90 tomorrow AM; call one of your doctors before restarting this.     oxyCODONE-acetaminophen 5-325 MG per tablet  Commonly known as:  PERCOCET/ROXICET  Take 1-2 tablets by mouth every 4 (four) hours as needed for moderate pain.     simvastatin 40 MG tablet  Commonly known as:  ZOCOR  Take 40 mg by mouth every evening.     tamsulosin 0.4 MG Caps capsule  Commonly known as:  FLOMAX  Take 0.4 mg by mouth every evening.     XARELTO 20 MG Tabs tablet  Generic drug:  Rivaroxaban  Take 20 mg by mouth every evening.       Follow-up Information   Follow up with [facility] On 03/12/2013. (Your appointment is at 2:45, be a the office at 2:15 for check in.)    Contact information:   [address] [place]   [place] [address] [phone]       Call Vena Austria, MD. (Follow up with him as needed for medical issues.)    Specialty:  Family Medicine   Contact information:   [place] [address] [phone]       Call [place]. (Let him know about your surgery and follow up as needed.)    Contact information:   [address]  [place] [address] [phone]      Follow up with If his heart rate is below 60 or more than 80, or if his BP is less  than 90, call Dr. Alyson Ingles or Oran Rein tomorrow.   Record BP and heart rate when you get home,  Supper time and bedtime today.  Check your BP in the AM and call if there is a concern tomorrow during the day.      SignedEarnstine Regal 02/18/2013, 1:52 PM

## 2013-02-18 NOTE — Progress Notes (Signed)
ANTICOAGULATION CONSULT NOTE - Follow Up Consult  Pharmacy Consult for Heparin IV Indication: atrial fibrillation  Allergies  Allergen Reactions  . Codeine Nausea Only  . Lactose Intolerance (Gi) Other (See Comments)    Gas pain    Patient Measurements: Height: 5\' 9"  (175.3 cm) Weight: 193 lb 12.6 oz (87.9 kg) IBW/kg (Calculated) : 70.7  Vital Signs: Temp: 97.5 F (36.4 C) (01/12 0147) Temp src: Oral (01/12 0147) BP: 139/73 mmHg (01/12 0147) Pulse Rate: 62 (01/12 0147)  Labs:  Recent Labs  02/16/13 0412 02/16/13 1249 02/16/13 2209 02/17/13 0449 02/18/13 0236  HGB 13.1  --   --  14.2 13.1  HCT 39.1  --   --  41.4 38.8*  PLT 132*  --   --  148* 152  HEPARINUNFRC 0.31 0.16* 0.37  --  0.29*  CREATININE 1.34  --   --  1.40* 1.20    Estimated Creatinine Clearance: 61.1 ml/min (by C-G formula based on Cr of 1.2).   Medications:  Scheduled:  . metoprolol  25 mg Oral BID  . piperacillin-tazobactam (ZOSYN)  IV  3.375 g Intravenous Q8H  . simvastatin  40 mg Oral QPM  . tamsulosin  0.4 mg Oral QPM   Infusions:  . heparin 950 Units/hr (02/17/13 1820)    Assessment: 14 yoM admitted 1/7 with acute cholecystitis with cholelithiasis.  Chronic Xarelto for hx of Afib was held on admission and pharmacy consulted to dose IV heparin as bridge for surgery.  Heparin initially started on 1/7.  She is now s/p ERCP on 1/9 and s/p laparoscopic cholecystectomy on 1/11.    Heparin resumed ~ 18:30 on 1/11 post op at previously therapeutic dose of 950 units/hr   Heparin level = 0.29 with heparin @ 950 units/hr  CBC stable  No complications or bleeding noted/reported.  Goal of Therapy:  Heparin level 0.3-0.7 units/ml Monitor platelets by anticoagulation protocol: Yes   Plan:   Increase heparin slightly to 1000 units/hr  Recheck Heparin level 8 hours after rate increase  Daily heparin level and CBC  Continue to monitor H&H and platelets  Follow up plans to resume Xarelto  when appropriate.  Leone Haven, PharmD  02/18/2013 3:30 AM

## 2013-02-18 NOTE — Discharge Summary (Signed)
Agree with summary. 

## 2013-02-18 NOTE — Progress Notes (Signed)
Patient discharged home with wife, discharge instructions given and explained to patient/wife and they verbalized understanding, denies any pain/distress; surgical incision clean/dry/intact, no other wound noted, skin intact. Accompanied home by wife, transported to the car by staff.

## 2013-02-18 NOTE — Progress Notes (Signed)
Patient blood pressure in the low 100s and heart rate down to 50s at times, metoprolol not given and Dr. Wendee Beavers notified and okay with holding the metoprolol this AM. Will continue to assess patient.

## 2013-02-18 NOTE — Progress Notes (Signed)
1 Day Post-Op  Subjective: He feels good, and took a regular diet.  We will stop his heparin and restart his Xarelto.  If he is doing well home after lunch today.  Objective: Vital signs in last 24 hours: Temp:  [97.5 F (36.4 C)-98.7 F (37.1 C)] 97.9 F (36.6 C) (01/12 0853) Pulse Rate:  [62-80] 62 (01/12 0853) Resp:  [13-20] 20 (01/12 0853) BP: (91-145)/(56-82) 105/58 mmHg (01/12 0853) SpO2:  [94 %-97 %] 97 % (01/12 0853) Last BM Date: 02/18/13 120 PO yesterday, + BM Diet: regular Afebrile, BP down some, HR in the 60's. Labs are better, T Bil back to normal , WBC is normal, AST/ALT still up some Intake/Output from previous day: 01/11 0701 - 01/12 0700 In: 1791.6 [P.O.:120; I.V.:1521.6; IV Piggyback:150] Out: 400 [Urine:400] Intake/Output this shift: Total I/O In: 320 [P.O.:320] Out: -   General appearance: alert, cooperative and no distress Resp: clear to auscultation bilaterally GI: soft sore, but not really tender.  tolerating diet.   Lab Results:   Recent Labs  02/17/13 0449 02/18/13 0236  WBC 3.8* 6.2  HGB 14.2 13.1  HCT 41.4 38.8*  PLT 148* 152    BMET  Recent Labs  02/17/13 0449 02/18/13 0236  NA 139 135*  K 4.1 4.2  CL 102 97  CO2 26 26  GLUCOSE 106* 176*  BUN 13 16  CREATININE 1.40* 1.20  CALCIUM 9.0 9.2   PT/INR No results found for this basename: LABPROT, INR,  in the last 72 hours   Recent Labs Lab 02/14/13 0025 02/15/13 0811 02/16/13 0412 02/17/13 0449 02/18/13 0236  AST 221* 83* 58* 68* 83*  ALT 200* 119* 88* 80* 85*  ALKPHOS 86 117 145* 142* 117  BILITOT 6.2* 4.4* 3.4* 1.8* 1.2  PROT 6.4 6.8 6.6 6.9 6.8  ALBUMIN 3.1* 3.1* 3.0* 3.0* 3.0*     Lipase     Component Value Date/Time   LIPASE 18 02/16/2013 0412     Studies/Results: Dg Cholangiogram Operative  02/17/2013   CLINICAL DATA:  Cholecystectomy  EXAM: INTRAOPERATIVE CHOLANGIOGRAM  TECHNIQUE: Cholangiographic images from the C-arm fluoroscopic device were submitted  for interpretation post-operatively. Please see the procedural report for the amount of contrast and the fluoroscopy time utilized.  COMPARISON:  02/14/2013  FINDINGS: Contrast is injected through the cystic duct remnant. There is some leak or reflux back into the gallbladder if that has not yet been removed. There are small filling defects in the cystic duct consistent with small stones. These could possibly be small air bubbles. There are small filling defects in the distal common bile duct consistent with small stones. There is no obstruction as contrast does pass into the intestine.  IMPRESSION: Small filling defects in the cystic duct and common bile duct without obstruction. In correlation with the previous MRI, these could represent small stones. Air bubbles are not excluded. Contrast is seen to reflux back into the gallbladder if this has not been removed. If it has, there is some leak.   Electronically Signed   By: Nelson Chimes M.D.   On: 02/17/2013 10:37    Medications: . metoprolol  25 mg Oral BID  . piperacillin-tazobactam (ZOSYN)  IV  3.375 g Intravenous Q8H  . simvastatin  40 mg Oral QPM  . tamsulosin  0.4 mg Oral QPM   Prior to Admission medications   Medication Sig Start Date End Date Taking? Authorizing Provider  metoprolol (LOPRESSOR) 50 MG tablet Take 25 mg by mouth 2 (two)  times daily.    Yes Historical Provider, MD  Rivaroxaban (XARELTO) 20 MG TABS tablet Take 20 mg by mouth every evening.   Yes Historical Provider, MD  simvastatin (ZOCOR) 40 MG tablet Take 40 mg by mouth every evening.   Yes Historical Provider, MD  Tamsulosin HCl (FLOMAX) 0.4 MG CAPS Take 0.4 mg by mouth every evening.   Yes Historical Provider, MD     Assessment/Plan 1. Acute cholecystitis with cholelithiasis   S/p Laparoscopic Cholecystectomy with intraoperative cholangiogram. 2. Evelvated LFT's/MRCP shows possible small intraductal calculi/ERCP  by Dr. Watt Climes. ERCP with  removal of calculus/calculi and  ERCP with sphincterotomy/papillotomy 02/15/13. 3. 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 S/p Exploratory laparotomy with resection of perforated small bowel diverticula with primary  anastomosis, 10/23/12.  6. Hyperlipidemia. 7.  BPH   Plan:  Home later today, stop antibiotics, mobilize.  QIW:LNLGX filling defects in the cystic duct and common bile duct without obstruction. In correlation with the previous MRI, these could represent small stones. Air bubbles are not excluded. Contrast is seen to reflux back into the gallbladder if this has not been removed. If it has, there is some leak.  Repeat BP  98/64 sitting, HR 68, sittling;  98/60, standing HR up to 80 walking, back down to 70 with rest.   Plan to send home with just PM dose of lopressor, he will keep a record of HR and BP with instructions to call his medical doctors if he has an issue.    LOS: 5 days    Valli Randol 02/18/2013

## 2013-02-18 NOTE — Progress Notes (Signed)
Patient seen and examined.  Agree with PA's note.  

## 2013-02-18 NOTE — Progress Notes (Signed)
Patient BP 99/54 patient denies any distress, notified Jennings-PA. At the bedside assessing patient.

## 2013-02-18 NOTE — Progress Notes (Signed)
ANTICOAGULATION CONSULT NOTE - Initial Consult  Pharmacy Consult for Xarelto Indication: atrial fibrillation  Allergies  Allergen Reactions  . Codeine Nausea Only  . Lactose Intolerance (Gi) Other (See Comments)    Gas pain    Labs:  Recent Labs  02/16/13 0412 02/16/13 1249 02/16/13 2209 02/17/13 0449 02/18/13 0236  HGB 13.1  --   --  14.2 13.1  HCT 39.1  --   --  41.4 38.8*  PLT 132*  --   --  148* 152  HEPARINUNFRC 0.31 0.16* 0.37  --  0.29*  CREATININE 1.34  --   --  1.40* 1.20    Estimated Creatinine Clearance: 61.1 ml/min (by C-G formula based on Cr of 1.2).   Assessment: 89 yoM on Xarelto 20mg  po daily PTA for history of atrial fibrillation admitted 1/7 with cholecystitis. Patient was started on IV heparin with planned for surgery. Patient is now s/p lap cholecystectomy and MD would like to transition from IV heparin back to Xarelto with plans for discharge today if pt continues to do well.   CBC wnl. No bleeding. Good renal function.  Plan:   Stop IV heparin and all heparin protocol labs   At the same time of stopping IV heparin, start Xarelto 20mg  x 1 then subsequent doses will be given with supper (next dose due 1/13)   RN [facility]) aware of plans   Vanessa Houston, PharmD, BCPS Pager: [phone] 11:05 AM Pharmacy #: [id number]

## 2013-02-19 ENCOUNTER — Encounter (HOSPITAL_COMMUNITY): Payer: Self-pay | Admitting: Gastroenterology

## 2013-02-20 ENCOUNTER — Telehealth (INDEPENDENT_AMBULATORY_CARE_PROVIDER_SITE_OTHER): Payer: Self-pay | Admitting: *Deleted

## 2013-02-20 NOTE — Telephone Encounter (Signed)
Patient called to report that he has been having "watery"stool since his surgery.  Explained to patient that this is normal following this type of surgery.  Patient states understanding and agreeable at this time.

## 2013-03-12 ENCOUNTER — Encounter (INDEPENDENT_AMBULATORY_CARE_PROVIDER_SITE_OTHER): Payer: Self-pay | Admitting: General Surgery

## 2013-03-12 ENCOUNTER — Ambulatory Visit (INDEPENDENT_AMBULATORY_CARE_PROVIDER_SITE_OTHER): Payer: Medicare Other | Admitting: General Surgery

## 2013-03-12 VITALS — BP 126/76 | HR 65 | Temp 98.0°F | Resp 18 | Ht 71.0 in | Wt 191.0 lb

## 2013-03-12 DIAGNOSIS — K811 Chronic cholecystitis: Secondary | ICD-10-CM

## 2013-03-12 NOTE — Patient Instructions (Signed)
Follow up as needed

## 2013-03-12 NOTE — Progress Notes (Signed)
Michael Mejia November 16, 1940 [id number] 03/12/2013   Michael Mejia is a 73 y.o. male who had a laparoscopic cholecystectomy with intraoperative cholangiogram by Dr. Hassell Done.  The pathology report confirmed chronic cholecystitis.  The patient reports that they are feeling well with normal bowel movements and good appetite.  The pre-operative symptoms of abdominal pain, nausea, and vomiting have resolved.    Physical examination - Incisions appear well-healed with no sign of infection or bleeding.   Abdomen - soft, non-tender  Impression:  s/p laparoscopic cholecystectomy  Plan:  He may resume a regular diet and full activity.  He may follow-up on a PRN basis.

## 2013-07-24 DIAGNOSIS — J3081 Allergic rhinitis due to animal (cat) (dog) hair and dander: Secondary | ICD-10-CM

## 2013-07-24 DIAGNOSIS — J309 Allergic rhinitis, unspecified: Secondary | ICD-10-CM

## 2013-07-24 DIAGNOSIS — R059 Cough, unspecified: Secondary | ICD-10-CM

## 2013-07-24 DIAGNOSIS — R05 Cough: Secondary | ICD-10-CM | POA: Insufficient documentation

## 2013-07-24 DIAGNOSIS — J301 Allergic rhinitis due to pollen: Secondary | ICD-10-CM | POA: Insufficient documentation

## 2013-07-24 HISTORY — DX: Allergic rhinitis due to pollen: J30.1

## 2013-07-24 HISTORY — DX: Allergic rhinitis due to animal (cat) (dog) hair and dander: J30.81

## 2013-07-24 HISTORY — DX: Cough, unspecified: R05.9

## 2013-07-24 HISTORY — DX: Allergic rhinitis, unspecified: J30.9

## 2013-07-24 HISTORY — DX: Cough: R05

## 2013-09-03 ENCOUNTER — Ambulatory Visit (INDEPENDENT_AMBULATORY_CARE_PROVIDER_SITE_OTHER): Payer: BC Managed Care – HMO | Admitting: Podiatry

## 2013-09-03 ENCOUNTER — Encounter: Payer: Self-pay | Admitting: Podiatry

## 2013-09-03 VITALS — BP 104/67 | HR 63 | Ht 70.0 in | Wt 191.0 lb

## 2013-09-03 DIAGNOSIS — M722 Plantar fascial fibromatosis: Secondary | ICD-10-CM

## 2013-09-03 DIAGNOSIS — M216X1 Other acquired deformities of right foot: Secondary | ICD-10-CM

## 2013-09-03 DIAGNOSIS — M216X2 Other acquired deformities of left foot: Secondary | ICD-10-CM

## 2013-09-03 DIAGNOSIS — M79609 Pain in unspecified limb: Secondary | ICD-10-CM

## 2013-09-03 DIAGNOSIS — M216X9 Other acquired deformities of unspecified foot: Secondary | ICD-10-CM

## 2013-09-03 HISTORY — DX: Other acquired deformities of right foot: M21.6X1

## 2013-09-03 HISTORY — DX: Plantar fascial fibromatosis: M72.2

## 2013-09-03 NOTE — Progress Notes (Signed)
Subjective: 73 year old male presents complaining of painful heel left foot x 3-4 weeks. He is retired and not on feet much. He cannot recall any incident that could caused the heel pain.   Objective: Neurovascular status are within normal. No skin lesions. High arched Cavus type foot. Radiographic examination reveal supinated Subtalar joint with high instep. No abnormal osseous or articular surfaces noted.   Assessment: Plantar fasciitis left foot. Cavus foot.  Plan: Reviewed findings and available treatment options.  Injected left heel plantar medial with mixture of 4 mg Dexamethasone, 4 mg Triamcinolone, and 1 cc of 0.5% Marcaine plain. Patient tolerated well without difficulty.  OTC orthotics dispensed. Return as needed.

## 2013-09-03 NOTE — Patient Instructions (Signed)
Seen for left heel pain. Injection given. OTC orthotics dispensed. Return as needed.

## 2013-10-27 IMAGING — CR DG ABDOMEN ACUTE W/ 1V CHEST
4 series · 4 of 4 positions shown · non-contrast
Comparison: 04/11/2003

CLINICAL DATA: Fever.  Abdominal pain radiating towards the groin.

ACUTE ABDOMEN SERIES (ABDOMEN 2 VIEW & CHEST 1 VIEW)

[w chest pa]
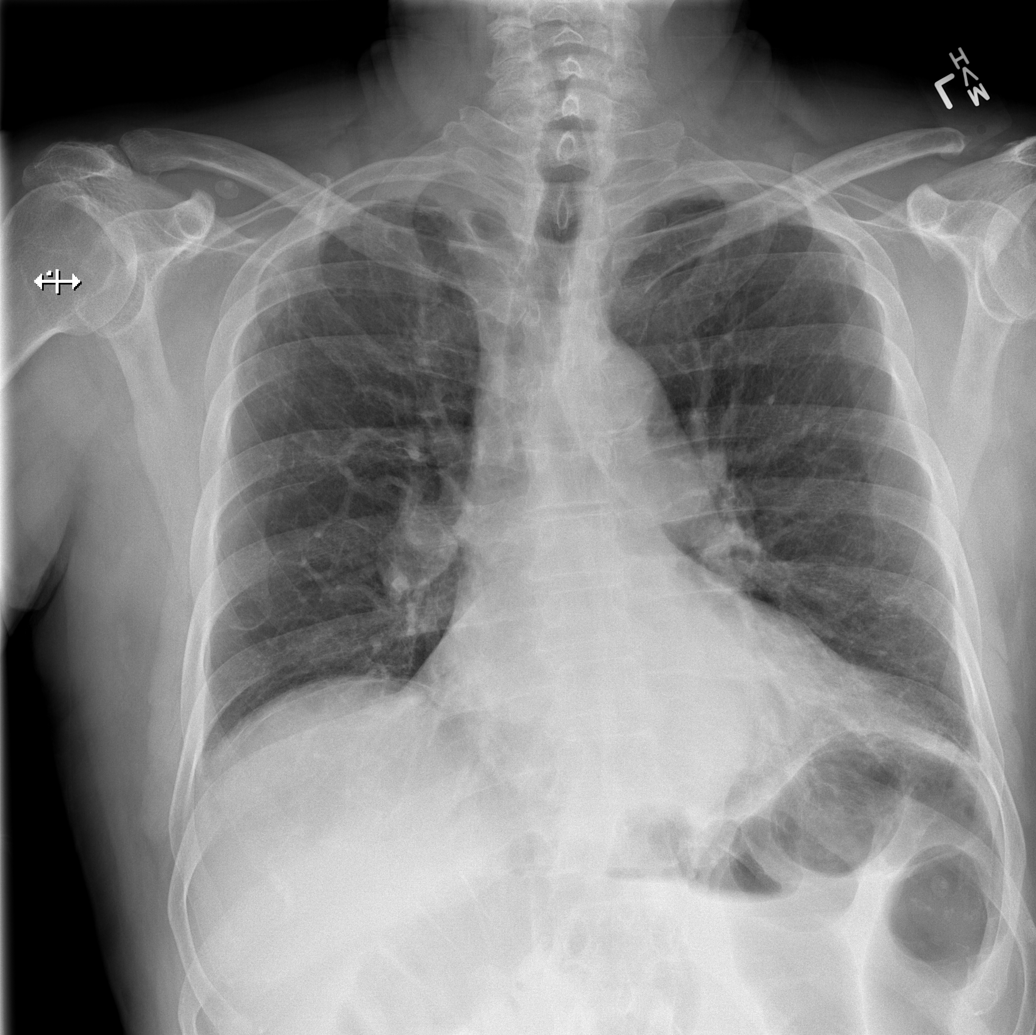

[w abdomen upright]
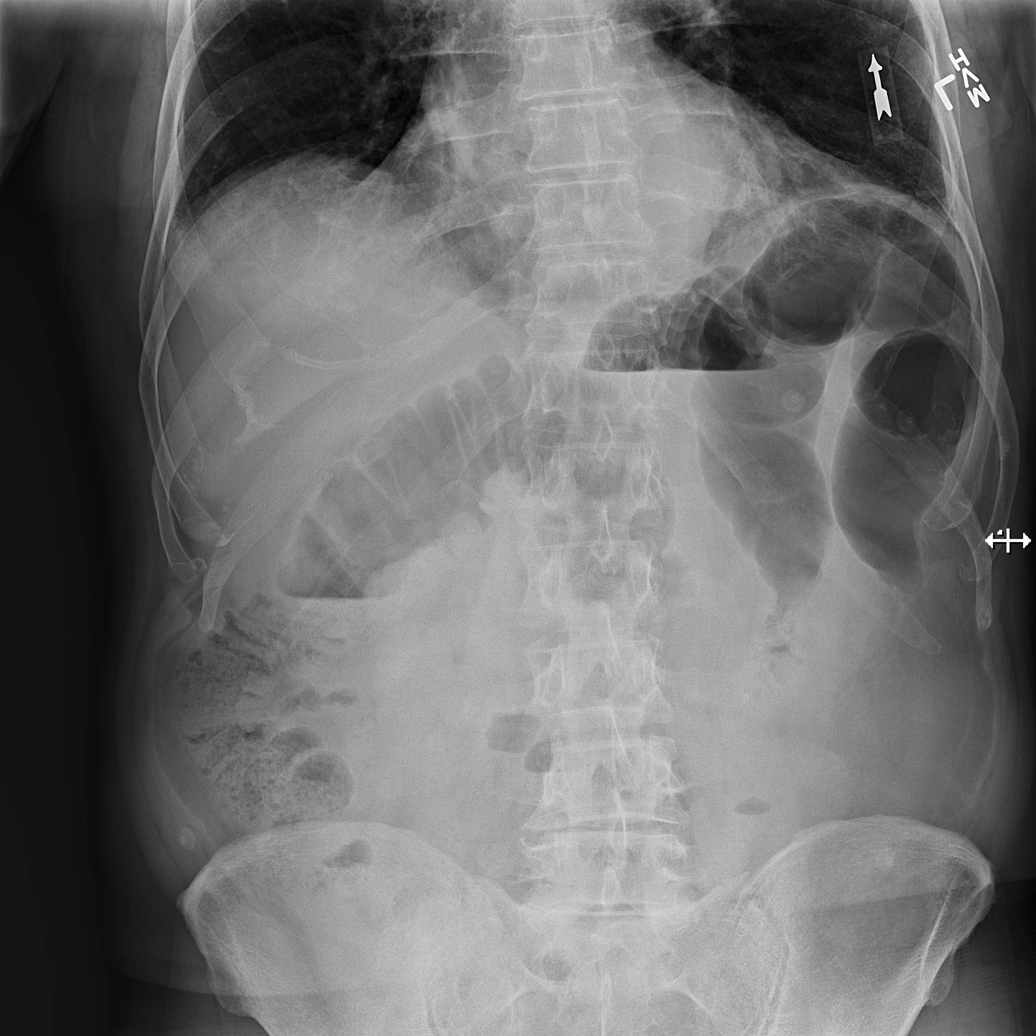

[t abdomen supine (1 of 2)]
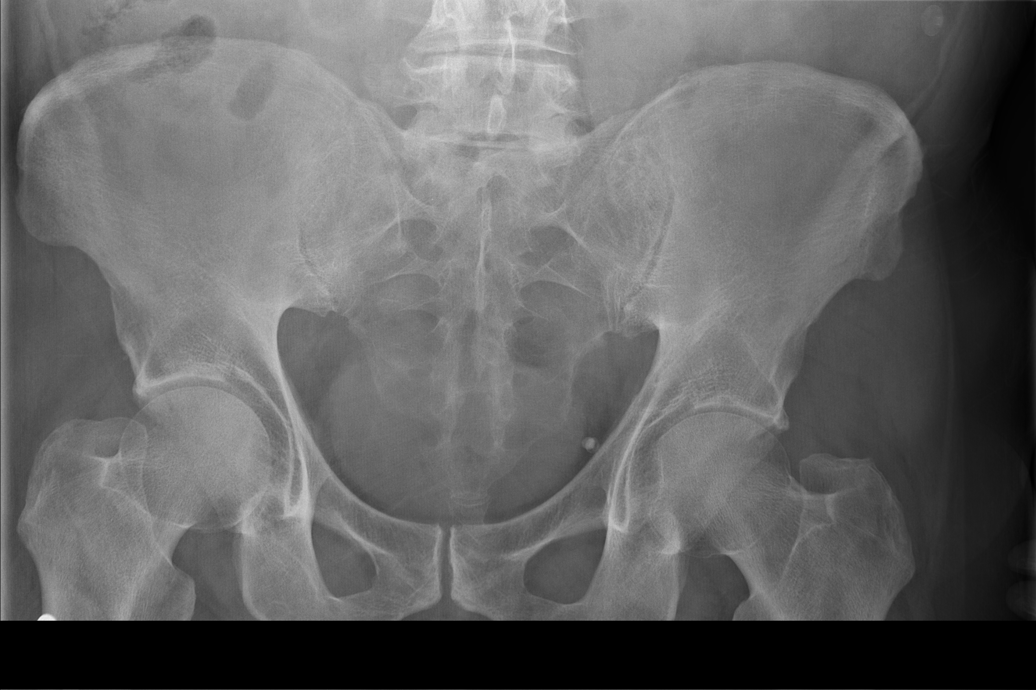

[t abdomen supine (2 of 2)]
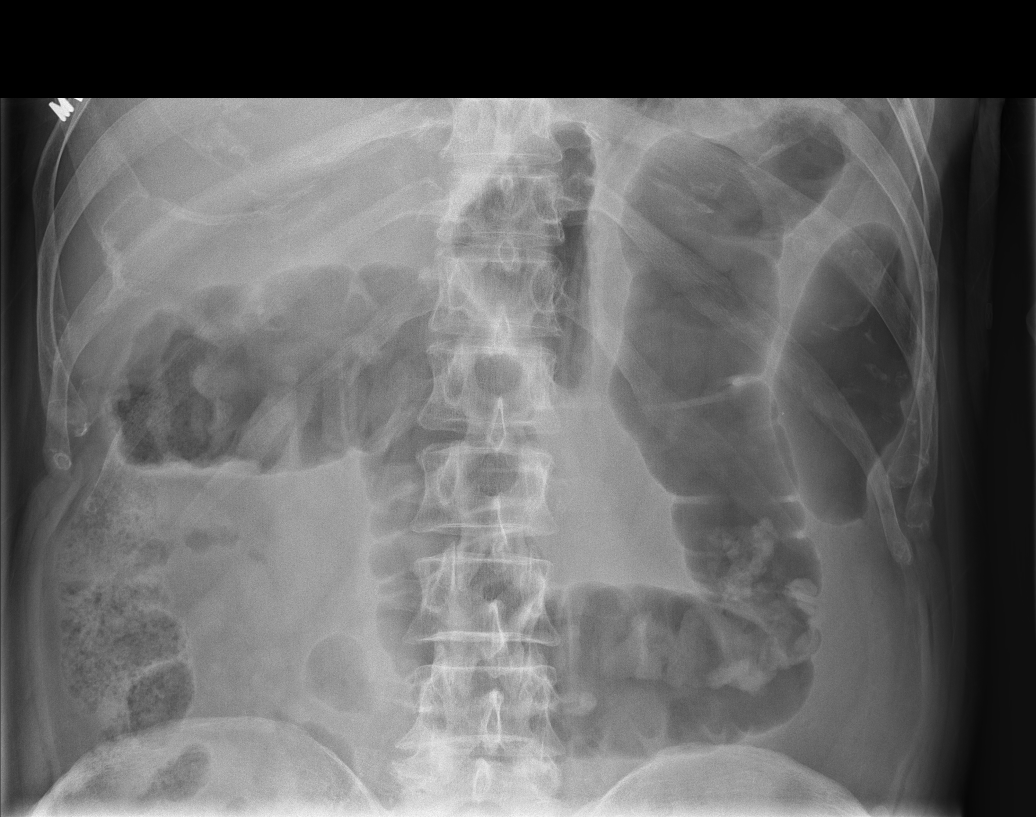

[4 of 4 positions shown; findings below may reference images not displayed]

FINDINGS: On the chest radiograph, right retro cardiac density
favors hiatal hernia.  Tortuous thoracic aorta noted.  There is
suspected subsegmental atelectasis at the left lung base.

Faint linear edge noted along the right hemidiaphragm.  Although
the appearance is not classic for free intraperitoneal gas,
intraperitoneal gas becomes difficult to completely exclude given
this appearance.  Accordingly, I recommend either a left side down
lateral decubitus view the abdomen or abdomen CT for further
characterization.

Gas and stool noted in the colon several air-fluid levels are
present in nondilated central abdominal small bowel, nonspecific.
IMPRESSION: 1.  Several nonspecific air-fluid levels and central abdominal
loops of nondilated small bowel, query ileus.
2. Faint linear structure extending along the right hemidiaphragm
is probably due to clothing and seems too thin to represent the
right hemidiaphragm in the setting of free intraperitoneal gas.
However, I do recommend a left side down lateral decubitus view of
the abdomen for further confirmation that there is no free
intraperitoneal gas.  Alternatively, abdomen CT could be utilized.
3.  Retrocardiac density favors hiatal hernia.
4.  Linear subsegmental atelectasis in the left lower lobe.

## 2013-11-08 ENCOUNTER — Ambulatory Visit (INDEPENDENT_AMBULATORY_CARE_PROVIDER_SITE_OTHER): Payer: BC Managed Care – HMO | Admitting: Podiatry

## 2013-11-08 ENCOUNTER — Encounter: Payer: Self-pay | Admitting: Podiatry

## 2013-11-08 VITALS — BP 137/87 | HR 66 | Ht 70.0 in | Wt 191.0 lb

## 2013-11-08 DIAGNOSIS — M21962 Unspecified acquired deformity of left lower leg: Secondary | ICD-10-CM

## 2013-11-08 DIAGNOSIS — M722 Plantar fascial fibromatosis: Secondary | ICD-10-CM

## 2013-11-08 HISTORY — DX: Unspecified acquired deformity of left lower leg: M21.962

## 2013-11-08 NOTE — Progress Notes (Signed)
73 year old male presents complaining of left heel pain. Had done extra work last Wednesday. Since then the left heel start hurting in the morning and in the afternoon. Tried Night splint that helped some. He had previous cortisone injection when he had first heel pain. He did not get much improvement with the injection.  Patient points just distal to the center of heel pad left foot being the source of pain.  Objective: Cavus foot with weakened first ray and excess sagittal plane motion compared with the contralateral side. Pain in left heel. Neurovascular status are within normal.  Assessment: Recurrent plantar fasciitis left heel.  Plan: Left heel injected with mixture of 4 mg Dexamethasone, 4 mg Triamcinolone, and 1 cc of 0.5% Marcaine plain. Patient tolerated well without difficulty.  Both feet casted for custom orthotics.

## 2013-11-08 NOTE — Patient Instructions (Signed)
Seen for pain in left heel. Injection given. Casted for Orthotics.

## 2013-12-17 ENCOUNTER — Other Ambulatory Visit: Payer: Self-pay | Admitting: Family Medicine

## 2013-12-17 DIAGNOSIS — Z139 Encounter for screening, unspecified: Secondary | ICD-10-CM

## 2013-12-25 ENCOUNTER — Ambulatory Visit
Admission: RE | Admit: 2013-12-25 | Discharge: 2013-12-25 | Disposition: A | Discharge status: Home / Self Care | Payer: Medicare Other | Source: Ambulatory Visit | Attending: Family Medicine | Admitting: Family Medicine

## 2013-12-25 DIAGNOSIS — Z139 Encounter for screening, unspecified: Secondary | ICD-10-CM

## 2014-07-05 ENCOUNTER — Emergency Department (HOSPITAL_COMMUNITY): Payer: Medicare Other

## 2014-07-05 ENCOUNTER — Encounter (HOSPITAL_COMMUNITY): Payer: Self-pay | Admitting: Emergency Medicine

## 2014-07-05 ENCOUNTER — Emergency Department (HOSPITAL_COMMUNITY)
Admission: EM | Admit: 2014-07-05 | Discharge: 2014-07-05 | Disposition: A | Discharge status: Home / Self Care | Payer: Medicare Other | Attending: Emergency Medicine | Admitting: Emergency Medicine

## 2014-07-05 DIAGNOSIS — R1013 Epigastric pain: Secondary | ICD-10-CM | POA: Diagnosis not present

## 2014-07-05 DIAGNOSIS — Z8739 Personal history of other diseases of the musculoskeletal system and connective tissue: Secondary | ICD-10-CM | POA: Insufficient documentation

## 2014-07-05 DIAGNOSIS — Z79899 Other long term (current) drug therapy: Secondary | ICD-10-CM | POA: Insufficient documentation

## 2014-07-05 DIAGNOSIS — E780 Pure hypercholesterolemia: Secondary | ICD-10-CM | POA: Insufficient documentation

## 2014-07-05 DIAGNOSIS — Z87891 Personal history of nicotine dependence: Secondary | ICD-10-CM | POA: Insufficient documentation

## 2014-07-05 DIAGNOSIS — Z8679 Personal history of other diseases of the circulatory system: Secondary | ICD-10-CM | POA: Insufficient documentation

## 2014-07-05 DIAGNOSIS — R1110 Vomiting, unspecified: Secondary | ICD-10-CM | POA: Insufficient documentation

## 2014-07-05 DIAGNOSIS — R1084 Generalized abdominal pain: Secondary | ICD-10-CM | POA: Diagnosis present

## 2014-07-05 LAB — COMPREHENSIVE METABOLIC PANEL WITH GFR
ALT: 13 U/L — ABNORMAL LOW (ref 17–63)
AST: 19 U/L (ref 15–41)
Albumin: 4 g/dL (ref 3.5–5.0)
Alkaline Phosphatase: 50 U/L (ref 38–126)
Anion gap: 9 (ref 5–15)
BUN: 20 mg/dL (ref 6–20)
CO2: 24 mmol/L (ref 22–32)
Calcium: 9.3 mg/dL (ref 8.9–10.3)
Chloride: 105 mmol/L (ref 101–111)
Creatinine, Ser: 1.01 mg/dL (ref 0.61–1.24)
GFR calc Af Amer: >60 mL/min
GFR calc non Af Amer: >60 mL/min
Glucose, Bld: 117 mg/dL — ABNORMAL HIGH (ref 65–99)
Potassium: 4.2 mmol/L (ref 3.5–5.1)
Sodium: 138 mmol/L (ref 135–145)
Total Bilirubin: 1.4 mg/dL — ABNORMAL HIGH (ref 0.3–1.2)
Total Protein: 7.3 g/dL (ref 6.5–8.1)

## 2014-07-05 LAB — CBC WITH DIFFERENTIAL/PLATELET
Basophils Absolute: 0 K/uL (ref 0.0–0.1)
Basophils Relative: 0 % (ref 0–1)
Eosinophils Absolute: 0 K/uL (ref 0.0–0.7)
Eosinophils Relative: 1 % (ref 0–5)
HCT: 44.3 % (ref 39.0–52.0)
Hemoglobin: 14.8 g/dL (ref 13.0–17.0)
Lymphocytes Relative: 12 % (ref 12–46)
Lymphs Abs: 1.1 K/uL (ref 0.7–4.0)
MCH: 29.5 pg (ref 26.0–34.0)
MCHC: 33.4 g/dL (ref 30.0–36.0)
MCV: 88.2 fL (ref 78.0–100.0)
Monocytes Absolute: 0.5 K/uL (ref 0.1–1.0)
Monocytes Relative: 6 % (ref 3–12)
Neutro Abs: 7.2 K/uL (ref 1.7–7.7)
Neutrophils Relative %: 81 % — ABNORMAL HIGH (ref 43–77)
Platelets: 166 K/uL (ref 150–400)
RBC: 5.02 MIL/uL (ref 4.22–5.81)
RDW: 13.4 % (ref 11.5–15.5)
WBC: 8.9 K/uL (ref 4.0–10.5)

## 2014-07-05 LAB — I-STAT TROPONIN, ED: Troponin i, poc: 0 ng/mL (ref 0.00–0.08)

## 2014-07-05 LAB — LIPASE, BLOOD: LIPASE: 22 U/L (ref 22–51)

## 2014-07-05 MED ORDER — IOHEXOL 300 MG/ML  SOLN
100.0000 mL | Freq: Once | INTRAMUSCULAR | Status: AC | PRN
  Administered 2014-07-05: 100 mL via INTRAVENOUS

## 2014-07-05 MED ORDER — PANTOPRAZOLE SODIUM 40 MG PO TBEC: 40.0000 mg | DELAYED_RELEASE_TABLET | Freq: Every day | ORAL | Status: DC

## 2014-07-05 MED ORDER — PANTOPRAZOLE SODIUM 40 MG PO TBEC
40.0000 mg | DELAYED_RELEASE_TABLET | Freq: Once | ORAL | Status: AC
Start: 2014-07-05 — End: 2014-07-05
  Administered 2014-07-05: 40 mg via ORAL
  Filled 2014-07-05: qty 1

## 2014-07-05 MED ORDER — ONDANSETRON 4 MG PO TBDP
ORAL_TABLET | ORAL | Status: DC
Start: 2014-07-05 — End: 2017-12-08

## 2014-07-05 MED ORDER — SODIUM CHLORIDE 0.9 % IV BOLUS (SEPSIS)
1000.0000 mL | Freq: Once | INTRAVENOUS | Status: AC
  Administered 2014-07-05: 1000 mL via INTRAVENOUS

## 2014-07-05 MED ORDER — GI COCKTAIL ~~LOC~~
30.0000 mL | Freq: Once | ORAL | Status: AC
  Administered 2014-07-05: 30 mL via ORAL
  Filled 2014-07-05: qty 30

## 2014-07-05 NOTE — ED Notes (Signed)
Nurse drawing labs. 

## 2014-07-05 NOTE — ED Notes (Signed)
Patient is alert and oriented x3.  He was given DC instructions and follow up visit instructions.  Patient gave verbal understanding.  He was DC ambulatory under his own power to home.  V/S stable.  He was not showing any signs of distress on DC 

## 2014-07-05 NOTE — ED Notes (Signed)
Pt c/o mid upper abdominal pain, described as a burning sensation, since this morning. Pt denies hx of similar pain. Pt sts he took an acid reducer without relief. Pt was seen at [facility] and he was sent here due to possible small bowel obstruction. Pt also has hx of perforated bowel. Pt sts pain is different than the pain of perforated bowel. Pt c/o N/V. Denies diarrhea, chest pain, SOB. A&Ox4 and ambulatory.

## 2014-07-05 NOTE — Discharge Instructions (Signed)

## 2014-07-05 NOTE — ED Provider Notes (Signed)
CSN: [id number]     Arrival date & time 07/05/14  1534 History   First MD Initiated Contact with Patient 07/05/14 1547     Chief Complaint  Patient presents with  . Abdominal Pain     (Consider location/radiation/quality/duration/timing/severity/associated sxs/prior Treatment) Patient is a 74 y.o. male presenting with abdominal pain. The history is provided by the patient.  Abdominal Pain Pain location:  Generalized Pain quality: burning   Pain quality: not heavy   Pain radiates to:  Does not radiate Pain severity:  Moderate Onset quality:  Gradual Timing:  Constant Progression:  Unchanged Chronicity:  New Context: not eating   Relieved by: lying flat. Exacerbated by: sitting up. Associated symptoms: vomiting (twice)   Associated symptoms: no anorexia, no chills, no cough, no fever and no shortness of breath     Past Medical History  Diagnosis Date  . Afib   . Hypercholesteremia   . Arthritis    Past Surgical History  Procedure Laterality Date  . Rotator cuff repair    . Laparotomy  10/24/2011    Procedure: EXPLORATORY LAPAROTOMY;  Surgeon: Pedro Earls, MD;  Location: [facility];  Service: General;  Laterality: N/A;  . Bowel resection  10/24/2011    Procedure: SMALL BOWEL RESECTION;  Surgeon: Pedro Earls, MD;  Location: [facility];  Service: General;;  for small bowel perforation   . Cholecystectomy N/A 02/17/2013    Procedure: LAPAROSCOPIC CHOLECYSTECTOMY WITH INTRAOPERATIVE CHOLANGIOGRAM;  Surgeon: Pedro Earls, MD;  Location: [facility];  Service: General;  Laterality: N/A;  . Ercp N/A 02/15/2013    Procedure: ENDOSCOPIC RETROGRADE CHOLANGIOPANCREATOGRAPHY (ERCP);  Surgeon: Jeryl Columbia, MD;  Location: [facility];  Service: Endoscopy;  Laterality: N/A;   Family History  Problem Relation Age of Onset  . Pancreatic cancer Mother   . Pancreatic cancer Brother    History  Substance Use Topics  . Smoking status: Former Research scientist (life sciences)  . Smokeless tobacco: Never Used  .  Alcohol Use: No    Review of Systems  Constitutional: Negative for fever and chills.  Respiratory: Negative for cough and shortness of breath.   Gastrointestinal: Positive for vomiting (twice) and abdominal pain. Negative for anorexia.  All other systems reviewed and are negative.     Allergies  Codeine and Lactose intolerance (gi)  Home Medications   Prior to Admission medications   Medication Sig Start Date End Date Taking? Authorizing Provider  metFORMIN (GLUCOPHAGE) 500 MG tablet Take 500 mg by mouth daily. 06/17/14  Yes Historical Provider, MD  metoprolol (LOPRESSOR) 50 MG tablet Take 25 mg by mouth 2 (two) times daily.    Yes Historical Provider, MD  Rivaroxaban (XARELTO) 20 MG TABS tablet Take 20 mg by mouth daily.    Yes Historical Provider, MD  simvastatin (ZOCOR) 40 MG tablet Take 40 mg by mouth every evening.   Yes Historical Provider, MD  Tamsulosin HCl (FLOMAX) 0.4 MG CAPS Take 0.4 mg by mouth every evening.   Yes Historical Provider, MD   BP 137/80 mmHg  Pulse 62  Temp(Src) 98.4 F (36.9 C) (Oral)  Resp 16  Ht 5\' 9"  (1.753 m)  Wt 186 lb (84.369 kg)  BMI 27.45 kg/m2  SpO2 100% Physical Exam  Constitutional: He is oriented to person, place, and time. He appears well-developed and well-nourished. No distress.  HENT:  Head: Normocephalic and atraumatic.  Mouth/Throat: No oropharyngeal exudate.  Eyes: EOM are normal. Pupils are equal, round, and reactive to light.  Neck: Normal range  of motion. Neck supple.  Cardiovascular: Normal rate and regular rhythm.  Exam reveals no friction rub.   No murmur heard. Pulmonary/Chest: Effort normal and breath sounds normal. No respiratory distress. He has no wheezes. He has no rales.  Abdominal: Soft. He exhibits no distension. There is tenderness (mild, epigastric). There is no rebound.  Musculoskeletal: Normal range of motion. He exhibits no edema.  Neurological: He is alert and oriented to person, place, and time.  Skin:  He is not diaphoretic.  Nursing note and vitals reviewed.   ED Course  Procedures (including critical care time) Labs Review Labs Reviewed  CBC WITH DIFFERENTIAL/PLATELET  COMPREHENSIVE METABOLIC PANEL  LIPASE, BLOOD  I-STAT TROPOININ, ED  I-STAT TROPOININ, ED    Imaging Review Ct Abdomen Pelvis W Contrast  07/05/2014   CLINICAL DATA:  Mid upper abdominal pain since this morning, concern for small bowel obstruction, history of perforated bowel with small bowel partial resection in 2013  EXAM: CT ABDOMEN AND PELVIS WITH CONTRAST  TECHNIQUE: Multidetector CT imaging of the abdomen and pelvis was performed using the standard protocol following bolus administration of intravenous contrast.  CONTRAST:  170mL OMNIPAQUE IOHEXOL 300 MG/ML  SOLN  COMPARISON:  02/13/2013  FINDINGS: Since the prior study the patient is status post cholecystectomy. There is as a result extrahepatic and intrahepatic pneumobilia.  Stomach small bowel and large bowel appear normal. There is small bowel anastomosis in the midline without evidence of complication. There is no small bowel dilatation. Appendix is normal.  There is no free fluid, adenopathy, or inflammatory change identified. Other than pneumobilia, liver is normal. Spleen and pancreas are normal. Adrenal glands are normal. Bilateral renal cysts are stable.  There is moderate atherosclerotic change of the aortoiliac vessels. Bladder is normal. Reproductive organs show no acute findings.  Visualized portions of the lung bases are clear. There are no acute musculoskeletal findings.  IMPRESSION: No acute findings.  No evidence of small bowel obstruction.   Electronically Signed   By: Skipper Cliche M.D.   On: 07/05/2014 18:21     EKG Interpretation   Date/Time:  Saturday Jul 05 2014 15:47:42 EDT Ventricular Rate:  69 PR Interval:    QRS Duration: 146 QT Interval:  412 QTC Calculation: 441 R Axis:   -48 Text Interpretation:  Atrial fibrillation Left bundle  branch block No  significant change since last tracing Confirmed by Mingo Amber  MD, Aubrey Voong  ([id number]) on 07/05/2014 3:55:06 PM      MDM   Final diagnoses:  Epigastric pain    74 year old male here with abdominal pain. Beginning this morning, described as burning. Better with lying down flat, worse with sitting up. No chest pain or pressure. No fever. Respiratory was sent from urgent care for concerns of possible small bowel obstruction. He does have history of bowel perforation and this had multiple surgeries in the past few years. Is well-appearing here. Mild epigastric pain on exam. Vitals are stable. We'll plan on CT scan.  CT normal. Labs ok. Patient given PPI and zofran. I gave patient and family strict return precautions and instructed to f/u with PCP.  Evelina Bucy, MD 07/05/14 [phone]

## 2014-11-18 ENCOUNTER — Other Ambulatory Visit: Payer: Self-pay | Admitting: Cardiology

## 2014-11-18 ENCOUNTER — Encounter (INDEPENDENT_AMBULATORY_CARE_PROVIDER_SITE_OTHER): Payer: Self-pay

## 2014-11-18 ENCOUNTER — Ambulatory Visit
Admission: RE | Admit: 2014-11-18 | Discharge: 2014-11-18 | Disposition: A | Discharge status: Home / Self Care | Payer: Medicare Other | Source: Ambulatory Visit | Attending: Cardiology | Admitting: Cardiology

## 2014-11-18 DIAGNOSIS — R0602 Shortness of breath: Secondary | ICD-10-CM

## 2015-02-19 IMAGING — CR DG ORBITS FOR FOREIGN BODY
2 series · 2 of 2 positions shown · non-contrast
Comparison: None.

CLINICAL DATA: Metal working/exposure; clearance prior to MRI

EXAM:
ORBITS FOR FOREIGN BODY - 2 VIEW

[w waters (1 of 2)]
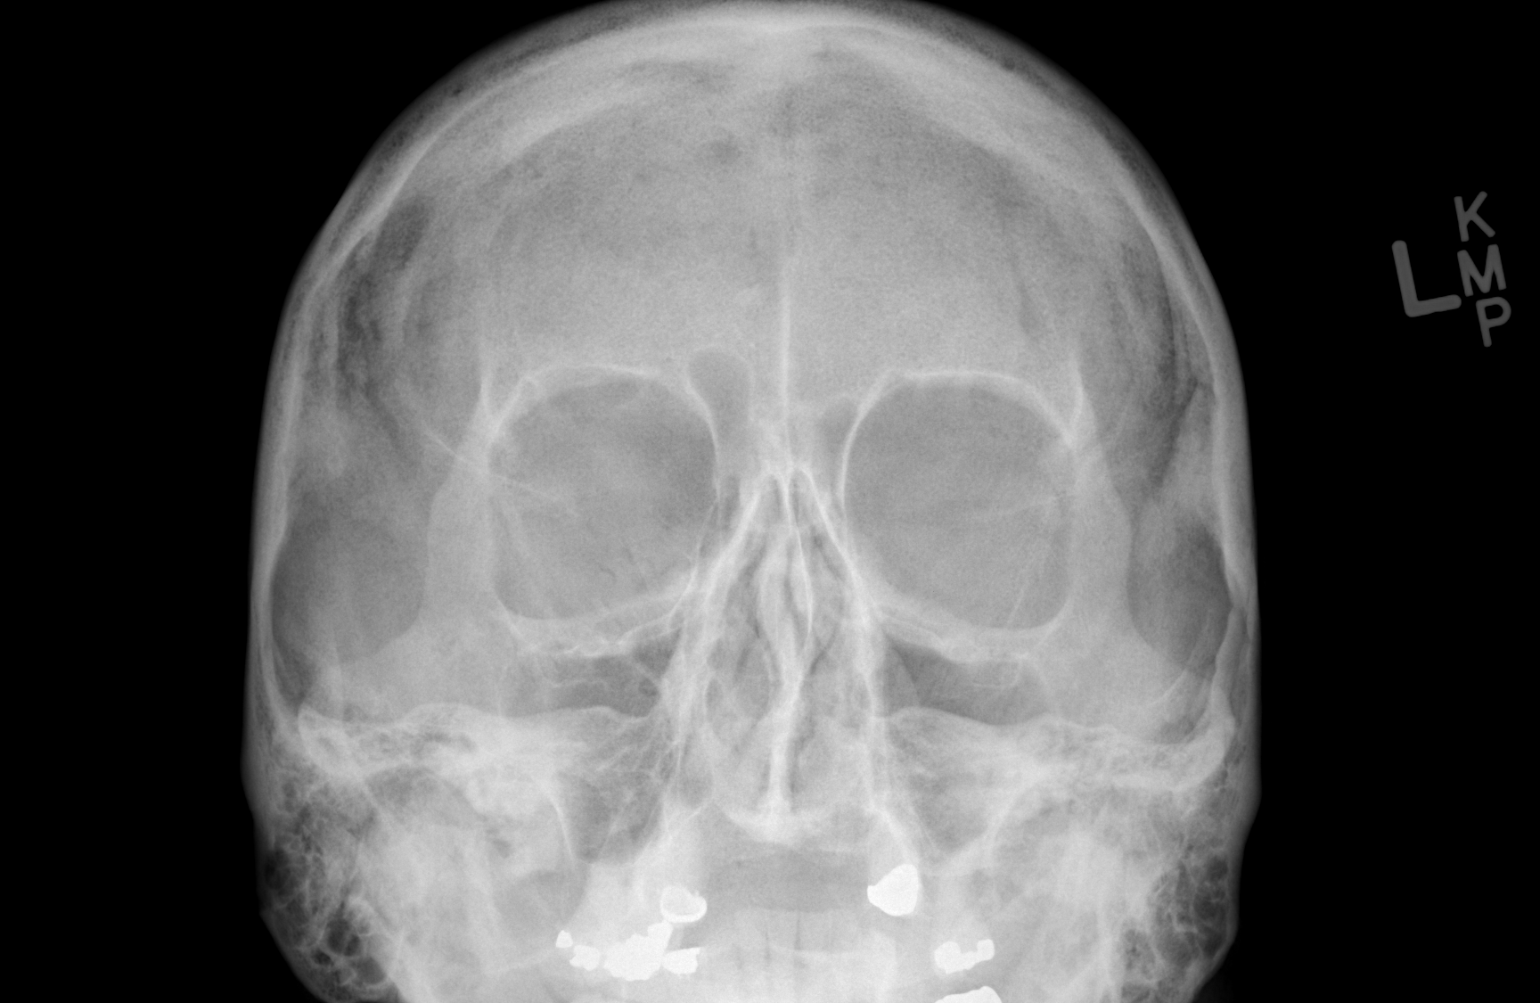

[w waters (2 of 2)]
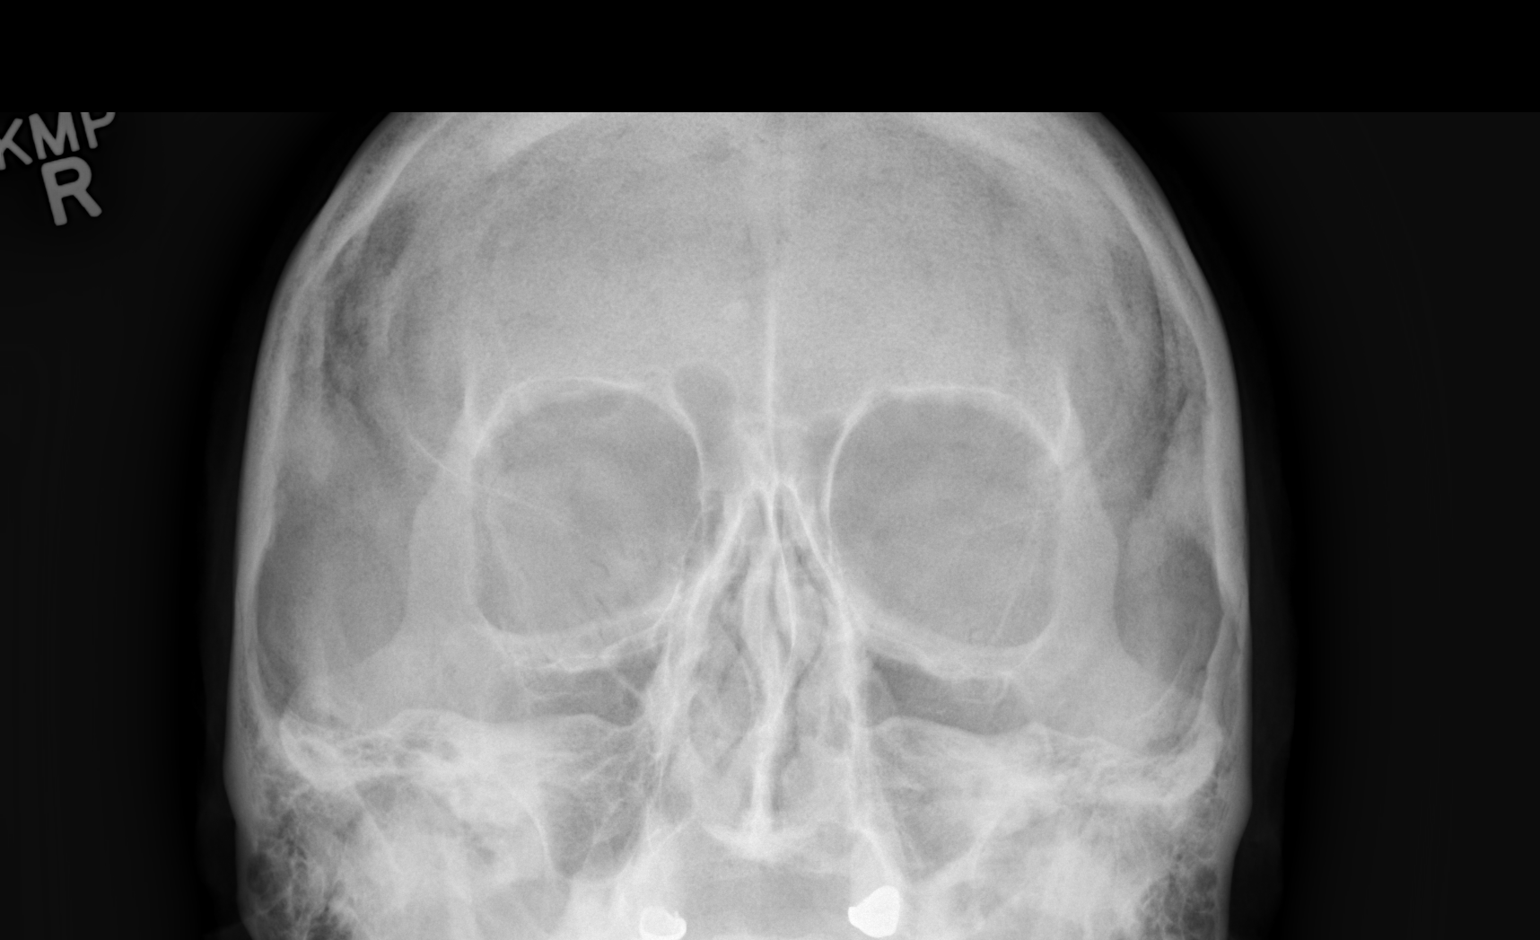

[2 of 2 positions shown; findings below may reference images not displayed]

FINDINGS: There is no evidence of metallic foreign body within the orbits. No
significant bone abnormality identified.
IMPRESSION: No evidence of metallic foreign body within the orbits.

## 2015-08-06 DIAGNOSIS — M79673 Pain in unspecified foot: Secondary | ICD-10-CM

## 2016-03-07 DIAGNOSIS — E782 Mixed hyperlipidemia: Secondary | ICD-10-CM | POA: Diagnosis not present

## 2016-03-07 DIAGNOSIS — Z7984 Long term (current) use of oral hypoglycemic drugs: Secondary | ICD-10-CM | POA: Diagnosis not present

## 2016-03-07 DIAGNOSIS — I481 Persistent atrial fibrillation: Secondary | ICD-10-CM | POA: Diagnosis not present

## 2016-03-07 DIAGNOSIS — Z0000 Encounter for general adult medical examination without abnormal findings: Secondary | ICD-10-CM | POA: Diagnosis not present

## 2016-03-07 DIAGNOSIS — Z1389 Encounter for screening for other disorder: Secondary | ICD-10-CM | POA: Diagnosis not present

## 2016-03-07 DIAGNOSIS — N400 Enlarged prostate without lower urinary tract symptoms: Secondary | ICD-10-CM | POA: Diagnosis not present

## 2016-03-07 DIAGNOSIS — I10 Essential (primary) hypertension: Secondary | ICD-10-CM | POA: Diagnosis not present

## 2016-03-07 DIAGNOSIS — D692 Other nonthrombocytopenic purpura: Secondary | ICD-10-CM | POA: Diagnosis not present

## 2016-03-07 DIAGNOSIS — E119 Type 2 diabetes mellitus without complications: Secondary | ICD-10-CM | POA: Diagnosis not present

## 2016-04-15 DIAGNOSIS — J069 Acute upper respiratory infection, unspecified: Secondary | ICD-10-CM | POA: Diagnosis not present

## 2016-04-15 DIAGNOSIS — N183 Chronic kidney disease, stage 3 (moderate): Secondary | ICD-10-CM | POA: Diagnosis not present

## 2016-04-15 DIAGNOSIS — M65331 Trigger finger, right middle finger: Secondary | ICD-10-CM | POA: Diagnosis not present

## 2016-07-11 DIAGNOSIS — L03818 Cellulitis of other sites: Secondary | ICD-10-CM | POA: Diagnosis not present

## 2016-08-18 DIAGNOSIS — L03315 Cellulitis of perineum: Secondary | ICD-10-CM | POA: Diagnosis not present

## 2016-09-05 DIAGNOSIS — E119 Type 2 diabetes mellitus without complications: Secondary | ICD-10-CM | POA: Diagnosis not present

## 2016-09-05 DIAGNOSIS — I481 Persistent atrial fibrillation: Secondary | ICD-10-CM | POA: Diagnosis not present

## 2016-09-05 DIAGNOSIS — N400 Enlarged prostate without lower urinary tract symptoms: Secondary | ICD-10-CM | POA: Diagnosis not present

## 2016-09-05 DIAGNOSIS — I10 Essential (primary) hypertension: Secondary | ICD-10-CM | POA: Diagnosis not present

## 2016-09-05 DIAGNOSIS — E782 Mixed hyperlipidemia: Secondary | ICD-10-CM | POA: Diagnosis not present

## 2016-09-05 DIAGNOSIS — Z6830 Body mass index (BMI) 30.0-30.9, adult: Secondary | ICD-10-CM | POA: Diagnosis not present

## 2016-09-05 DIAGNOSIS — Z7984 Long term (current) use of oral hypoglycemic drugs: Secondary | ICD-10-CM | POA: Diagnosis not present

## 2016-09-05 DIAGNOSIS — D485 Neoplasm of uncertain behavior of skin: Secondary | ICD-10-CM | POA: Diagnosis not present

## 2016-09-26 DIAGNOSIS — E860 Dehydration: Secondary | ICD-10-CM | POA: Diagnosis not present

## 2016-10-05 DIAGNOSIS — D692 Other nonthrombocytopenic purpura: Secondary | ICD-10-CM | POA: Diagnosis not present

## 2016-10-05 DIAGNOSIS — L821 Other seborrheic keratosis: Secondary | ICD-10-CM | POA: Diagnosis not present

## 2016-10-31 DIAGNOSIS — H52222 Regular astigmatism, left eye: Secondary | ICD-10-CM | POA: Diagnosis not present

## 2016-10-31 DIAGNOSIS — H40003 Preglaucoma, unspecified, bilateral: Secondary | ICD-10-CM | POA: Diagnosis not present

## 2016-11-28 DIAGNOSIS — N183 Chronic kidney disease, stage 3 (moderate): Secondary | ICD-10-CM | POA: Diagnosis not present

## 2016-11-28 DIAGNOSIS — Z23 Encounter for immunization: Secondary | ICD-10-CM | POA: Diagnosis not present

## 2016-11-29 DIAGNOSIS — E7849 Other hyperlipidemia: Secondary | ICD-10-CM | POA: Diagnosis not present

## 2016-11-29 DIAGNOSIS — I2510 Atherosclerotic heart disease of native coronary artery without angina pectoris: Secondary | ICD-10-CM | POA: Diagnosis not present

## 2016-11-29 DIAGNOSIS — R0609 Other forms of dyspnea: Secondary | ICD-10-CM | POA: Diagnosis not present

## 2016-11-29 DIAGNOSIS — Z7901 Long term (current) use of anticoagulants: Secondary | ICD-10-CM | POA: Diagnosis not present

## 2016-11-29 DIAGNOSIS — I447 Left bundle-branch block, unspecified: Secondary | ICD-10-CM | POA: Diagnosis not present

## 2016-11-29 DIAGNOSIS — I482 Chronic atrial fibrillation: Secondary | ICD-10-CM | POA: Diagnosis not present

## 2016-11-29 DIAGNOSIS — I119 Hypertensive heart disease without heart failure: Secondary | ICD-10-CM | POA: Diagnosis not present

## 2017-03-13 DIAGNOSIS — D485 Neoplasm of uncertain behavior of skin: Secondary | ICD-10-CM | POA: Diagnosis not present

## 2017-03-13 DIAGNOSIS — I10 Essential (primary) hypertension: Secondary | ICD-10-CM | POA: Diagnosis not present

## 2017-03-13 DIAGNOSIS — Z1389 Encounter for screening for other disorder: Secondary | ICD-10-CM | POA: Diagnosis not present

## 2017-03-13 DIAGNOSIS — Z0000 Encounter for general adult medical examination without abnormal findings: Secondary | ICD-10-CM | POA: Diagnosis not present

## 2017-03-13 DIAGNOSIS — E119 Type 2 diabetes mellitus without complications: Secondary | ICD-10-CM | POA: Diagnosis not present

## 2017-03-13 DIAGNOSIS — M65331 Trigger finger, right middle finger: Secondary | ICD-10-CM | POA: Diagnosis not present

## 2017-03-13 DIAGNOSIS — N400 Enlarged prostate without lower urinary tract symptoms: Secondary | ICD-10-CM | POA: Diagnosis not present

## 2017-03-13 DIAGNOSIS — I481 Persistent atrial fibrillation: Secondary | ICD-10-CM | POA: Diagnosis not present

## 2017-03-13 DIAGNOSIS — E782 Mixed hyperlipidemia: Secondary | ICD-10-CM | POA: Diagnosis not present

## 2017-03-13 DIAGNOSIS — M25619 Stiffness of unspecified shoulder, not elsewhere classified: Secondary | ICD-10-CM | POA: Diagnosis not present

## 2017-03-17 DIAGNOSIS — L821 Other seborrheic keratosis: Secondary | ICD-10-CM | POA: Diagnosis not present

## 2017-03-22 DIAGNOSIS — M6530 Trigger finger, unspecified finger: Secondary | ICD-10-CM | POA: Diagnosis not present

## 2017-03-22 DIAGNOSIS — M7541 Impingement syndrome of right shoulder: Secondary | ICD-10-CM | POA: Diagnosis not present

## 2017-03-22 DIAGNOSIS — M65331 Trigger finger, right middle finger: Secondary | ICD-10-CM | POA: Diagnosis not present

## 2017-03-22 DIAGNOSIS — M25511 Pain in right shoulder: Secondary | ICD-10-CM | POA: Diagnosis not present

## 2017-04-06 DIAGNOSIS — L03314 Cellulitis of groin: Secondary | ICD-10-CM | POA: Diagnosis not present

## 2017-04-18 DIAGNOSIS — I482 Chronic atrial fibrillation: Secondary | ICD-10-CM | POA: Diagnosis not present

## 2017-04-18 DIAGNOSIS — E785 Hyperlipidemia, unspecified: Secondary | ICD-10-CM | POA: Diagnosis not present

## 2017-04-18 DIAGNOSIS — Z7901 Long term (current) use of anticoagulants: Secondary | ICD-10-CM | POA: Diagnosis not present

## 2017-04-18 DIAGNOSIS — E669 Obesity, unspecified: Secondary | ICD-10-CM | POA: Diagnosis not present

## 2017-04-18 DIAGNOSIS — I2510 Atherosclerotic heart disease of native coronary artery without angina pectoris: Secondary | ICD-10-CM | POA: Diagnosis not present

## 2017-04-18 DIAGNOSIS — I119 Hypertensive heart disease without heart failure: Secondary | ICD-10-CM | POA: Diagnosis not present

## 2017-04-18 DIAGNOSIS — I447 Left bundle-branch block, unspecified: Secondary | ICD-10-CM | POA: Diagnosis not present

## 2017-04-18 DIAGNOSIS — R42 Dizziness and giddiness: Secondary | ICD-10-CM | POA: Diagnosis not present

## 2017-04-18 DIAGNOSIS — R0602 Shortness of breath: Secondary | ICD-10-CM | POA: Diagnosis not present

## 2017-05-04 DIAGNOSIS — I2510 Atherosclerotic heart disease of native coronary artery without angina pectoris: Secondary | ICD-10-CM | POA: Diagnosis not present

## 2017-05-04 DIAGNOSIS — E669 Obesity, unspecified: Secondary | ICD-10-CM | POA: Diagnosis not present

## 2017-05-04 DIAGNOSIS — R0602 Shortness of breath: Secondary | ICD-10-CM | POA: Diagnosis not present

## 2017-05-04 DIAGNOSIS — Z7901 Long term (current) use of anticoagulants: Secondary | ICD-10-CM | POA: Diagnosis not present

## 2017-05-04 DIAGNOSIS — E785 Hyperlipidemia, unspecified: Secondary | ICD-10-CM | POA: Diagnosis not present

## 2017-05-04 DIAGNOSIS — I482 Chronic atrial fibrillation: Secondary | ICD-10-CM | POA: Diagnosis not present

## 2017-05-04 DIAGNOSIS — I119 Hypertensive heart disease without heart failure: Secondary | ICD-10-CM | POA: Diagnosis not present

## 2017-05-04 DIAGNOSIS — I447 Left bundle-branch block, unspecified: Secondary | ICD-10-CM | POA: Diagnosis not present

## 2017-06-13 DIAGNOSIS — L308 Other specified dermatitis: Secondary | ICD-10-CM | POA: Diagnosis not present

## 2017-06-13 DIAGNOSIS — L738 Other specified follicular disorders: Secondary | ICD-10-CM | POA: Diagnosis not present

## 2017-06-13 DIAGNOSIS — L0889 Other specified local infections of the skin and subcutaneous tissue: Secondary | ICD-10-CM | POA: Diagnosis not present

## 2017-07-24 DIAGNOSIS — L309 Dermatitis, unspecified: Secondary | ICD-10-CM | POA: Diagnosis not present

## 2017-09-06 DIAGNOSIS — J209 Acute bronchitis, unspecified: Secondary | ICD-10-CM | POA: Diagnosis not present

## 2017-09-12 DIAGNOSIS — N400 Enlarged prostate without lower urinary tract symptoms: Secondary | ICD-10-CM | POA: Diagnosis not present

## 2017-09-12 DIAGNOSIS — Z6830 Body mass index (BMI) 30.0-30.9, adult: Secondary | ICD-10-CM | POA: Diagnosis not present

## 2017-09-12 DIAGNOSIS — J209 Acute bronchitis, unspecified: Secondary | ICD-10-CM | POA: Diagnosis not present

## 2017-09-12 DIAGNOSIS — E1129 Type 2 diabetes mellitus with other diabetic kidney complication: Secondary | ICD-10-CM | POA: Diagnosis not present

## 2017-09-12 DIAGNOSIS — E782 Mixed hyperlipidemia: Secondary | ICD-10-CM | POA: Diagnosis not present

## 2017-09-12 DIAGNOSIS — I481 Persistent atrial fibrillation: Secondary | ICD-10-CM | POA: Diagnosis not present

## 2017-09-12 DIAGNOSIS — N183 Chronic kidney disease, stage 3 (moderate): Secondary | ICD-10-CM | POA: Diagnosis not present

## 2017-09-12 DIAGNOSIS — I129 Hypertensive chronic kidney disease with stage 1 through stage 4 chronic kidney disease, or unspecified chronic kidney disease: Secondary | ICD-10-CM | POA: Diagnosis not present

## 2017-10-30 DIAGNOSIS — H40003 Preglaucoma, unspecified, bilateral: Secondary | ICD-10-CM | POA: Diagnosis not present

## 2017-10-30 DIAGNOSIS — H524 Presbyopia: Secondary | ICD-10-CM | POA: Diagnosis not present

## 2017-11-09 DIAGNOSIS — Z23 Encounter for immunization: Secondary | ICD-10-CM | POA: Diagnosis not present

## 2017-12-08 ENCOUNTER — Encounter: Payer: Self-pay | Admitting: Cardiology

## 2017-12-08 ENCOUNTER — Telehealth: Payer: Self-pay | Admitting: Cardiology

## 2017-12-08 ENCOUNTER — Ambulatory Visit (INDEPENDENT_AMBULATORY_CARE_PROVIDER_SITE_OTHER): Payer: Medicare HMO | Admitting: Cardiology

## 2017-12-08 VITALS — BP 102/66 | HR 81 | Resp 98 | Ht 69.0 in | Wt 194.0 lb

## 2017-12-08 DIAGNOSIS — E782 Mixed hyperlipidemia: Secondary | ICD-10-CM

## 2017-12-08 DIAGNOSIS — Z13220 Encounter for screening for lipoid disorders: Secondary | ICD-10-CM | POA: Diagnosis not present

## 2017-12-08 DIAGNOSIS — I4891 Unspecified atrial fibrillation: Secondary | ICD-10-CM | POA: Diagnosis not present

## 2017-12-08 DIAGNOSIS — I4821 Permanent atrial fibrillation: Secondary | ICD-10-CM

## 2017-12-08 HISTORY — DX: Mixed hyperlipidemia: E78.2

## 2017-12-08 HISTORY — DX: Permanent atrial fibrillation: I48.21

## 2017-12-08 NOTE — Progress Notes (Signed)
Cardiology Office Note:    Date:  12/08/2017   ID:  Michael Mejia, DOB 1940/04/21, MRN [id number]  PCP:  Maury Dus, MD  Cardiologist:  Jenean Lindau, MD   Referring MD: Maury Dus, MD    ASSESSMENT:    1. Atrial fibrillation, unspecified type ([place])   2. Screening cholesterol level   3. Permanent atrial fibrillation   4. Mixed dyslipidemia    PLAN:    In order of problems listed above:  1. Secondary prevention stressed with the patient.  Importance of compliance with diet and medication stressed the vocalized understanding.  His blood pressure is stable. 2. I discussed with the patient atrial fibrillation, disease process. Management and therapy including rate and rhythm control, anticoagulation benefits and potential risks were discussed extensively with the patient. Patient had multiple questions which were answered to patient's satisfaction. 3. His blood pressure is stable.  He wants his medications refilled and therefore have asked him to come back for blood work fasting to [facility].  He lives at home there finds it convenient.  Echocardiogram will be done to assess murmur heard on auscultation. 4. Patient will be seen in follow-up appointment in 6 months or earlier if the patient has any concerns    Medication Adjustments/Labs and Tests Ordered: Current medicines are reviewed at length with the patient today.  Concerns regarding medicines are outlined above.  Orders Placed This Encounter  Procedures  . Basic Metabolic Panel (BMET)  . CBC  . TSH  . Hepatic function panel  . Lipid Profile  . EKG 12-Lead  . ECHOCARDIOGRAM COMPLETE   No orders of the defined types were placed in this encounter.    No chief complaint on file.    History of Present Illness:    LEEON Mejia is a 77 y.o. male.  Patient is previously unknown to me.  He has seen Dr. Wynonia Lawman in the past and is here for an evaluation.  He has chronic permanent atrial fibrillation, essential  hypertension, left bundle branch block and some history of coronary artery disease which is unspecified.  He is an active gentleman.  He denies any chest pain orthopnea or PND.  He takes care of activities of daily living.  No chest pain orthopnea.  He has had a stress test in the month of March which was unremarkable.  Past Medical History:  Diagnosis Date  . Afib ([place])   . Arthritis   . Hypercholesteremia     Past Surgical History:  Procedure Laterality Date  . BOWEL RESECTION  10/24/2011   Procedure: SMALL BOWEL RESECTION;  Surgeon: Pedro Earls, MD;  [facility];  Service: General;;  for small bowel perforation   . CHOLECYSTECTOMY N/A 02/17/2013   Procedure: LAPAROSCOPIC CHOLECYSTECTOMY WITH INTRAOPERATIVE CHOLANGIOGRAM;  Surgeon: Pedro Earls, MD;  Location: [facility];  Service: General;  Laterality: N/A;  . ERCP N/A 02/15/2013   Procedure: ENDOSCOPIC RETROGRADE CHOLANGIOPANCREATOGRAPHY (ERCP);  Surgeon: Jeryl Columbia, MD;  Location: [facility];  Service: Endoscopy;  Laterality: N/A;  . LAPAROTOMY  10/24/2011   Procedure: EXPLORATORY LAPAROTOMY;  Surgeon: Pedro Earls, MD;  [facility];  Service: General;  Laterality: N/A;  . ROTATOR CUFF REPAIR      Current Medications: Current Meds  Medication Sig  . fluticasone (FLONASE) 50 MCG/ACT nasal spray Place 1 spray into both nostrils daily.  Marland Kitchen loratadine (CLARITIN) 10 MG tablet Take 10 mg by mouth daily.  Marland Kitchen losartan (COZAAR) 100 MG tablet Take  50 mg by mouth daily.  . metFORMIN (GLUCOPHAGE) 500 MG tablet Take 500 mg by mouth daily.  . Rivaroxaban (XARELTO) 20 MG TABS tablet Take 20 mg by mouth daily.   . simvastatin (ZOCOR) 40 MG tablet Take 40 mg by mouth every evening.  . Tamsulosin HCl (FLOMAX) 0.4 MG CAPS Take 0.4 mg by mouth every evening.  . [DISCONTINUED] doxycycline (DORYX) 100 MG EC tablet Take 100 mg by mouth 2 (two) times daily.     Allergies:   Codeine and Lactose intolerance (gi)   Social History    Socioeconomic History  . Marital status: Married    Spouse name: Not on file  . Number of children: Not on file  . Years of education: Not on file  . Highest education level: Not on file  Occupational History  . Not on file  Social Needs  . Financial resource strain: Not on file  . Food insecurity:    Worry: Not on file    Inability: Not on file  . Transportation needs:    Medical: Not on file    Non-medical: Not on file  Tobacco Use  . Smoking status: Former Research scientist (life sciences)  . Smokeless tobacco: Never Used  Substance and Sexual Activity  . Alcohol use: No  . Drug use: No  . Sexual activity: Never  Lifestyle  . Physical activity:    Days per week: Not on file    Minutes per session: Not on file  . Stress: Not on file  Relationships  . Social connections:    Talks on phone: Not on file    Gets together: Not on file    Attends religious service: Not on file    Active member of club or organization: Not on file    Attends meetings of clubs or organizations: Not on file    Relationship status: Not on file  Other Topics Concern  . Not on file  Social History Narrative  . Not on file     Family History: The patient's family history includes Pancreatic cancer in his brother and mother.  ROS:   Please see the history of present illness.    All other systems reviewed and are negative.  EKGs/Labs/Other Studies Reviewed:    The following studies were reviewed today: EKG reveals atrial fibrillation and left bundle branch block and nonspecific ST changes.   Recent Labs: No results found for requested labs within last 8760 hours.  Recent Lipid Panel No results found for: CHOL, TRIG, HDL, CHOLHDL, VLDL, LDLCALC, LDLDIRECT  Physical Exam:    VS:  BP 102/66 (BP Location: Right Arm, Patient Position: Sitting, Cuff Size: Normal)   Pulse 81   Resp (!) 98   Ht 5\' 9"  (1.753 m)   Wt 194 lb (88 kg)   BMI 28.65 kg/m     Wt Readings from Last 3 Encounters:  12/08/17 194 lb (88  kg)  07/05/14 186 lb (84.4 kg)  11/08/13 191 lb (86.6 kg)     GEN: Patient is in no acute distress HEENT: Normal NECK: No JVD; No carotid bruits LYMPHATICS: No lymphadenopathy CARDIAC: Hear sounds irregular, 2/6 systolic murmur at the apex. RESPIRATORY:  Clear to auscultation without rales, wheezing or rhonchi  ABDOMEN: Soft, non-tender, non-distended MUSCULOSKELETAL:  No edema; No deformity  SKIN: Warm and dry NEUROLOGIC:  Alert and oriented x 3 PSYCHIATRIC:  Normal affect   Signed, Jenean Lindau, MD  12/08/2017 10:19 AM    [place]

## 2017-12-08 NOTE — Patient Instructions (Signed)
Medication Instructions:  Your physician recommends that you continue on your current medications as directed. Please refer to the Current Medication list given to you today.  If you need a refill on your cardiac medications before your next appointment, please call your pharmacy.   Lab work: Your physician recommends that you return for lab work on Monday, 12/11/17: BMP, CBC, TSH, LFTs, lipid panel. Please go to our [place] office for lab work: [place] [phone]. No appointment needed, please fast beforehand.   If you have labs (blood work) drawn today and your tests are completely normal, you will receive your results only by: Marland Kitchen MyChart Message (if you have MyChart) OR . A paper copy in the mail If you have any lab test that is abnormal or we need to change your treatment, we will call you to review the results.  Testing/Procedures: Your physician has requested that you have an echocardiogram. Echocardiography is a painless test that uses sound waves to create images of your heart. It provides your doctor with information about the size and shape of your heart and how well your heart's chambers and valves are working. This procedure takes approximately one hour. There are no restrictions for this procedure.  Follow-Up: At [facility], you and your health needs are our priority.  As part of our continuing mission to provide you with exceptional heart care, we have created designated Provider Care Teams.  These Care Teams include your primary Cardiologist (physician) and Advanced Practice Providers (APPs -  Physician Assistants and Nurse Practitioners) who all work together to provide you with the care you need, when you need it. You will need a follow up appointment in 6 months.  Please call our office 2 months in advance to schedule this appointment.      Echocardiogram An echocardiogram, or echocardiography, uses sound waves (ultrasound) to produce an image of your heart.  The echocardiogram is simple, painless, obtained within a short period of time, and offers valuable information to your health care provider. The images from an echocardiogram can provide information such as:  Evidence of coronary artery disease (CAD).  Heart size.  Heart muscle function.  Heart valve function.  Aneurysm detection.  Evidence of a past heart attack.  Fluid buildup around the heart.  Heart muscle thickening.  Assess heart valve function.  Tell a health care provider about:  Any allergies you have.  All medicines you are taking, including vitamins, herbs, eye drops, creams, and over-the-counter medicines.  Any problems you or family members have had with anesthetic medicines.  Any blood disorders you have.  Any surgeries you have had.  Any medical conditions you have.  Whether you are pregnant or may be pregnant. What happens before the procedure? No special preparation is needed. Eat and drink normally. What happens during the procedure?  In order to produce an image of your heart, gel will be applied to your chest and a wand-like tool (transducer) will be moved over your chest. The gel will help transmit the sound waves from the transducer. The sound waves will harmlessly bounce off your heart to allow the heart images to be captured in real-time motion. These images will then be recorded.  You may need an IV to receive a medicine that improves the quality of the pictures. What happens after the procedure? You may return to your normal schedule including diet, activities, and medicines, unless your health care provider tells you otherwise. This information is not intended to  replace advice given to you by your health care provider. Make sure you discuss any questions you have with your health care provider. Document Released: 01/22/2000 Document Revised: 09/12/2015 Document Reviewed: 10/01/2012 Elsevier Interactive Patient Education  2017 [employer].

## 2017-12-11 DIAGNOSIS — Z13220 Encounter for screening for lipoid disorders: Secondary | ICD-10-CM | POA: Diagnosis not present

## 2017-12-11 DIAGNOSIS — I4891 Unspecified atrial fibrillation: Secondary | ICD-10-CM | POA: Diagnosis not present

## 2017-12-11 NOTE — Telephone Encounter (Signed)
done

## 2017-12-12 ENCOUNTER — Other Ambulatory Visit: Payer: Self-pay

## 2017-12-12 LAB — LIPID PANEL
Chol/HDL Ratio: 3.5 ratio (ref 0.0–5.0)
Cholesterol, Total: 146 mg/dL (ref 100–199)
HDL: 42 mg/dL (ref >39)
LDL Calculated: 70 mg/dL (ref 0–99)
TRIGLYCERIDES: 170 mg/dL — AB (ref 0–149)
VLDL Cholesterol Cal: 34 mg/dL (ref 5–40)

## 2017-12-12 LAB — HEPATIC FUNCTION PANEL
ALK PHOS: 49 IU/L (ref 39–117)
ALT: 14 IU/L (ref 0–44)
AST: 26 IU/L (ref 0–40)
Albumin: 4 g/dL (ref 3.5–4.8)
BILIRUBIN, DIRECT: 0.21 mg/dL (ref 0.00–0.40)
Bilirubin Total: 0.8 mg/dL (ref 0.0–1.2)
Total Protein: 6.7 g/dL (ref 6.0–8.5)

## 2017-12-12 LAB — CBC
Hematocrit: 43.5 % (ref 37.5–51.0)
Hemoglobin: 14.3 g/dL (ref 13.0–17.7)
MCH: 29.7 pg (ref 26.6–33.0)
MCHC: 32.9 g/dL (ref 31.5–35.7)
MCV: 90 fL (ref 79–97)
Platelets: 193 10*3/uL (ref 150–450)
RBC: 4.81 x10E6/uL (ref 4.14–5.80)
RDW: 12.9 % (ref 12.3–15.4)
WBC: 4.8 10*3/uL (ref 3.4–10.8)

## 2017-12-12 LAB — BASIC METABOLIC PANEL
BUN / CREAT RATIO: 12 (ref 10–24)
BUN: 17 mg/dL (ref 8–27)
CO2: 22 mmol/L (ref 20–29)
Calcium: 9.3 mg/dL (ref 8.6–10.2)
Chloride: 101 mmol/L (ref 96–106)
Creatinine, Ser: 1.41 mg/dL — ABNORMAL HIGH (ref 0.76–1.27)
GFR calc non Af Amer: 48 mL/min/{1.73_m2} — ABNORMAL LOW (ref >59)
GFR, EST AFRICAN AMERICAN: 56 mL/min/{1.73_m2} — AB (ref >59)
Glucose: 129 mg/dL — ABNORMAL HIGH (ref 65–99)
Potassium: 4.4 mmol/L (ref 3.5–5.2)
Sodium: 140 mmol/L (ref 134–144)

## 2017-12-12 LAB — TSH: TSH: 3.34 u[IU]/mL (ref 0.450–4.500)

## 2017-12-12 MED ORDER — RIVAROXABAN 20 MG PO TABS: 20.0000 mg | ORAL_TABLET | Freq: Every day | ORAL | 2 refills | Status: DC

## 2017-12-12 MED ORDER — SIMVASTATIN 40 MG PO TABS: 40.0000 mg | ORAL_TABLET | Freq: Every evening | ORAL | 2 refills | Status: DC

## 2017-12-12 MED ORDER — LOSARTAN POTASSIUM 100 MG PO TABS: 50.0000 mg | ORAL_TABLET | Freq: Every day | ORAL | 0 refills | Status: DC

## 2018-01-19 ENCOUNTER — Ambulatory Visit (INDEPENDENT_AMBULATORY_CARE_PROVIDER_SITE_OTHER): Payer: Medicare HMO

## 2018-01-19 DIAGNOSIS — I4891 Unspecified atrial fibrillation: Secondary | ICD-10-CM

## 2018-01-19 NOTE — Progress Notes (Signed)
Complete echocardiogram has been performed.  Jimmy Jaimin Krupka RDCS, RVT 

## 2018-02-01 ENCOUNTER — Telehealth: Payer: Self-pay | Admitting: *Deleted

## 2018-02-01 NOTE — Telephone Encounter (Signed)
Patient informed of echocardiogram results per Dr. Geraldo Pitter. 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 Geraldo Pitter does not feel this is necessary at this time since patient is fairly asymptomatic. Patient agreeable. Advised him to contact our office with questions or concerns. Patient verbalized understanding. No further questions.

## 2018-02-06 DIAGNOSIS — Z961 Presence of intraocular lens: Secondary | ICD-10-CM | POA: Diagnosis not present

## 2018-02-06 DIAGNOSIS — H26493 Other secondary cataract, bilateral: Secondary | ICD-10-CM | POA: Diagnosis not present

## 2018-02-06 DIAGNOSIS — E119 Type 2 diabetes mellitus without complications: Secondary | ICD-10-CM | POA: Diagnosis not present

## 2018-02-06 DIAGNOSIS — H26492 Other secondary cataract, left eye: Secondary | ICD-10-CM | POA: Diagnosis not present

## 2018-02-06 DIAGNOSIS — H40013 Open angle with borderline findings, low risk, bilateral: Secondary | ICD-10-CM | POA: Diagnosis not present

## 2018-02-27 DIAGNOSIS — H26491 Other secondary cataract, right eye: Secondary | ICD-10-CM | POA: Diagnosis not present

## 2018-03-19 DIAGNOSIS — M65331 Trigger finger, right middle finger: Secondary | ICD-10-CM | POA: Diagnosis not present

## 2018-03-19 DIAGNOSIS — Z1389 Encounter for screening for other disorder: Secondary | ICD-10-CM | POA: Diagnosis not present

## 2018-03-19 DIAGNOSIS — E1129 Type 2 diabetes mellitus with other diabetic kidney complication: Secondary | ICD-10-CM | POA: Diagnosis not present

## 2018-03-19 DIAGNOSIS — M25619 Stiffness of unspecified shoulder, not elsewhere classified: Secondary | ICD-10-CM | POA: Diagnosis not present

## 2018-03-19 DIAGNOSIS — N183 Chronic kidney disease, stage 3 (moderate): Secondary | ICD-10-CM | POA: Diagnosis not present

## 2018-03-19 DIAGNOSIS — E782 Mixed hyperlipidemia: Secondary | ICD-10-CM | POA: Diagnosis not present

## 2018-03-19 DIAGNOSIS — I129 Hypertensive chronic kidney disease with stage 1 through stage 4 chronic kidney disease, or unspecified chronic kidney disease: Secondary | ICD-10-CM | POA: Diagnosis not present

## 2018-03-19 DIAGNOSIS — N400 Enlarged prostate without lower urinary tract symptoms: Secondary | ICD-10-CM | POA: Diagnosis not present

## 2018-03-19 DIAGNOSIS — Z0000 Encounter for general adult medical examination without abnormal findings: Secondary | ICD-10-CM | POA: Diagnosis not present

## 2018-09-18 DIAGNOSIS — N183 Chronic kidney disease, stage 3 (moderate): Secondary | ICD-10-CM | POA: Diagnosis not present

## 2018-09-18 DIAGNOSIS — N400 Enlarged prostate without lower urinary tract symptoms: Secondary | ICD-10-CM | POA: Diagnosis not present

## 2018-09-18 DIAGNOSIS — E1129 Type 2 diabetes mellitus with other diabetic kidney complication: Secondary | ICD-10-CM | POA: Diagnosis not present

## 2018-09-18 DIAGNOSIS — E782 Mixed hyperlipidemia: Secondary | ICD-10-CM | POA: Diagnosis not present

## 2018-09-18 DIAGNOSIS — I4819 Other persistent atrial fibrillation: Secondary | ICD-10-CM | POA: Diagnosis not present

## 2018-09-18 DIAGNOSIS — I129 Hypertensive chronic kidney disease with stage 1 through stage 4 chronic kidney disease, or unspecified chronic kidney disease: Secondary | ICD-10-CM | POA: Diagnosis not present

## 2018-09-24 DIAGNOSIS — N183 Chronic kidney disease, stage 3 (moderate): Secondary | ICD-10-CM | POA: Diagnosis not present

## 2018-09-24 DIAGNOSIS — E1129 Type 2 diabetes mellitus with other diabetic kidney complication: Secondary | ICD-10-CM | POA: Diagnosis not present

## 2018-09-24 DIAGNOSIS — E782 Mixed hyperlipidemia: Secondary | ICD-10-CM | POA: Diagnosis not present

## 2018-10-31 DIAGNOSIS — H5203 Hypermetropia, bilateral: Secondary | ICD-10-CM | POA: Diagnosis not present

## 2018-10-31 DIAGNOSIS — H40023 Open angle with borderline findings, high risk, bilateral: Secondary | ICD-10-CM | POA: Diagnosis not present

## 2018-10-31 DIAGNOSIS — E119 Type 2 diabetes mellitus without complications: Secondary | ICD-10-CM | POA: Diagnosis not present

## 2018-11-06 ENCOUNTER — Ambulatory Visit: Payer: Medicare HMO | Admitting: Cardiology

## 2018-11-06 DIAGNOSIS — M189 Osteoarthritis of first carpometacarpal joint, unspecified: Secondary | ICD-10-CM | POA: Diagnosis not present

## 2018-11-06 DIAGNOSIS — E1129 Type 2 diabetes mellitus with other diabetic kidney complication: Secondary | ICD-10-CM | POA: Diagnosis not present

## 2018-11-06 DIAGNOSIS — N400 Enlarged prostate without lower urinary tract symptoms: Secondary | ICD-10-CM | POA: Diagnosis not present

## 2018-11-06 DIAGNOSIS — N183 Chronic kidney disease, stage 3 (moderate): Secondary | ICD-10-CM | POA: Diagnosis not present

## 2018-11-06 DIAGNOSIS — E782 Mixed hyperlipidemia: Secondary | ICD-10-CM | POA: Diagnosis not present

## 2018-11-06 DIAGNOSIS — I129 Hypertensive chronic kidney disease with stage 1 through stage 4 chronic kidney disease, or unspecified chronic kidney disease: Secondary | ICD-10-CM | POA: Diagnosis not present

## 2018-11-06 DIAGNOSIS — I4819 Other persistent atrial fibrillation: Secondary | ICD-10-CM | POA: Diagnosis not present

## 2018-11-15 ENCOUNTER — Ambulatory Visit (INDEPENDENT_AMBULATORY_CARE_PROVIDER_SITE_OTHER): Payer: Medicare HMO | Admitting: Cardiology

## 2018-11-15 ENCOUNTER — Encounter: Payer: Self-pay | Admitting: Cardiology

## 2018-11-15 ENCOUNTER — Other Ambulatory Visit: Payer: Self-pay

## 2018-11-15 VITALS — BP 98/64 | HR 71 | Ht 69.0 in | Wt 196.0 lb

## 2018-11-15 DIAGNOSIS — I4821 Permanent atrial fibrillation: Secondary | ICD-10-CM | POA: Diagnosis not present

## 2018-11-15 DIAGNOSIS — E782 Mixed hyperlipidemia: Secondary | ICD-10-CM | POA: Diagnosis not present

## 2018-11-15 DIAGNOSIS — Z1329 Encounter for screening for other suspected endocrine disorder: Secondary | ICD-10-CM

## 2018-11-15 NOTE — Progress Notes (Signed)
Cardiology Office Note:    Date:  11/15/2018   ID:  Michael Mejia, DOB Nov 22, 1940, MRN Michael:[id number]  PCP:  Maury Dus, MD  Cardiologist:  Jenean Lindau, MD   Referring MD: Maury Dus, MD    ASSESSMENT:    1. Permanent atrial fibrillation ([place])   2. Mixed dyslipidemia    PLAN:    In order of problems listed above:  1. Permanent atrial fibrillation:I discussed with the patient atrial fibrillation, disease process. Management and therapy including rate and rhythm control, anticoagulation benefits and potential risks were discussed extensively with the patient. Patient had multiple questions which were answered to patient's satisfaction. 2. Mixed dyslipidemia: Diet was discussed with the patient at extensive length.  He is on statin therapy and will have blood work today including fasting lipids. 3. Patient will be seen in follow-up appointment in 6 months or earlier if the patient has any concerns    Medication Adjustments/Labs and Tests Ordered: Current medicines are reviewed at length with the patient today.  Concerns regarding medicines are outlined above.  No orders of the defined types were placed in this encounter.  No orders of the defined types were placed in this encounter.    Chief Complaint  Patient presents with  . Follow-up     History of Present Illness:    Michael Mejia is a 78 y.o. male.  Patient has history of permanent atrial fibrillation, mixed dyslipidemia.  He denies any problems at this time and takes care of activities of daily living.  No chest pain orthopnea or PND.  He ambulates on a regular basis.  At the time of my evaluation, the patient is alert awake oriented and in no distress.  Past Medical History:  Diagnosis Date  . Afib ([place])   . Arthritis   . Hypercholesteremia     Past Surgical History:  Procedure Laterality Date  . BOWEL RESECTION  10/24/2011   Procedure: SMALL BOWEL RESECTION;  Surgeon: Pedro Earls, MD;  Location: [facility];  Service: General;;  for small bowel perforation   . CHOLECYSTECTOMY N/A 02/17/2013   Procedure: LAPAROSCOPIC CHOLECYSTECTOMY WITH INTRAOPERATIVE CHOLANGIOGRAM;  Surgeon: Pedro Earls, MD;  Location: [facility];  Service: General;  Laterality: N/A;  . ERCP N/A 02/15/2013   Procedure: ENDOSCOPIC RETROGRADE CHOLANGIOPANCREATOGRAPHY (ERCP);  Surgeon: Jeryl Columbia, MD;  Location: [facility];  Service: Endoscopy;  Laterality: N/A;  . LAPAROTOMY  10/24/2011   Procedure: EXPLORATORY LAPAROTOMY;  Surgeon: Pedro Earls, MD;  Location: [facility];  Service: General;  Laterality: N/A;  . ROTATOR CUFF REPAIR      Current Medications: Current Meds  Medication Sig  . fluticasone (FLONASE) 50 MCG/ACT nasal spray Place 1 spray into both nostrils daily.  Marland Kitchen loratadine (CLARITIN) 10 MG tablet Take 10 mg by mouth daily.  Marland Kitchen losartan (COZAAR) 100 MG tablet Take 0.5 tablets (50 mg total) by mouth daily.  . metFORMIN (GLUCOPHAGE) 500 MG tablet Take 500 mg by mouth daily.  . rivaroxaban (XARELTO) 20 MG TABS tablet Take 1 tablet (20 mg total) by mouth daily with supper.  . simvastatin (ZOCOR) 40 MG tablet Take 1 tablet (40 mg total) by mouth every evening.  . Tamsulosin HCl (FLOMAX) 0.4 MG CAPS Take 0.4 mg by mouth every evening.     Allergies:   Codeine and Lactose intolerance (gi)   Social History   Socioeconomic History  . Marital status: Married    Spouse name: Not on file  .  Number of children: Not on file  . Years of education: Not on file  . Highest education level: Not on file  Occupational History  . Not on file  Social Needs  . Financial resource strain: Not on file  . Food insecurity    Worry: Not on file    Inability: Not on file  . Transportation needs    Medical: Not on file    Non-medical: Not on file  Tobacco Use  . Smoking status: Former Research scientist (life sciences)  . Smokeless tobacco: Never Used  Substance and Sexual Activity  . Alcohol use: No  . Drug use: No  . Sexual activity: Never   Lifestyle  . Physical activity    Days per week: Not on file    Minutes per session: Not on file  . Stress: Not on file  Relationships  . Social Herbalist on phone: Not on file    Gets together: Not on file    Attends religious service: Not on file    Active member of club or organization: Not on file    Attends meetings of clubs or organizations: Not on file    Relationship status: Not on file  Other Topics Concern  . Not on file  Social History Narrative  . Not on file     Family History: The patient's family history includes Pancreatic cancer in his brother and mother.  ROS:   Please see the history of present illness.    All other systems reviewed and are negative.  EKGs/Labs/Other Studies Reviewed:    The following studies were reviewed today: EKG reveals atrial fibrillation and left bundle branch block.   Recent Labs: 12/11/2017: ALT 14; BUN 17; Creatinine, Ser 1.41; Hemoglobin 14.3; Platelets 193; Potassium 4.4; Sodium 140; TSH 3.340  Recent Lipid Panel    Component Value Date/Time   CHOL 146 12/11/2017 0939   TRIG 170 (H) 12/11/2017 0939   HDL 42 12/11/2017 0939   CHOLHDL 3.5 12/11/2017 0939   LDLCALC 70 12/11/2017 0939    Physical Exam:    VS:  BP 98/64   Pulse 71   Ht 5\' 9"  (1.753 m)   Wt 196 lb (88.9 kg)   SpO2 100%   BMI 28.94 kg/m     Wt Readings from Last 3 Encounters:  11/15/18 196 lb (88.9 kg)  12/08/17 194 lb (88 kg)  07/05/14 186 lb (84.4 kg)     GEN: Patient is in no acute distress HEENT: Normal NECK: No JVD; No carotid bruits LYMPHATICS: No lymphadenopathy CARDIAC: Hear sounds irregular, 2/6 systolic murmur at the apex. RESPIRATORY:  Clear to auscultation without rales, wheezing or rhonchi  ABDOMEN: Soft, non-tender, non-distended MUSCULOSKELETAL:  No edema; No deformity  SKIN: Warm and dry NEUROLOGIC:  Alert and oriented x 3 PSYCHIATRIC:  Normal affect   Signed, Jenean Lindau, MD  11/15/2018 8:21 AM    [facility]

## 2018-11-15 NOTE — Patient Instructions (Signed)
Medication Instructions:  Your physician recommends that you continue on your current medications as directed. Please refer to the Current Medication list given to you today.  If you need a refill on your cardiac medications before your next appointment, please call your pharmacy.   Lab work: Your physician recommends that you have a lipid, hepatic, TSH, CBC and BMP drawn today.  If you have labs (blood work) drawn today and your tests are completely normal, you will receive your results only by: Marland Kitchen MyChart Message (if you have MyChart) OR . A paper copy in the mail If you have any lab test that is abnormal or we need to change your treatment, we will call you to review the results.  Testing/Procedures: You had an EKG performed today  Follow-Up: At [facility], you and your health needs are our priority.  As part of our continuing mission to provide you with exceptional heart care, we have created designated Provider Care Teams.  These Care Teams include your primary Cardiologist (physician) and Advanced Practice Providers (APPs -  Physician Assistants and Nurse Practitioners) who all work together to provide you with the care you need, when you need it. You will need a follow up appointment in 6 months.

## 2018-11-15 NOTE — Addendum Note (Signed)
Addended by: Beckey Rutter on: 11/15/2018 08:33 AM   Modules accepted: Orders

## 2018-11-16 LAB — HEPATIC FUNCTION PANEL
ALT: 9 IU/L (ref 0–44)
AST: 16 IU/L (ref 0–40)
Albumin: 4 g/dL (ref 3.7–4.7)
Alkaline Phosphatase: 52 IU/L (ref 39–117)
Bilirubin Total: 0.8 mg/dL (ref 0.0–1.2)
Bilirubin, Direct: 0.19 mg/dL (ref 0.00–0.40)
Total Protein: 6.7 g/dL (ref 6.0–8.5)

## 2018-11-16 LAB — BASIC METABOLIC PANEL
BUN/Creatinine Ratio: 15 (ref 10–24)
BUN: 19 mg/dL (ref 8–27)
CO2: 23 mmol/L (ref 20–29)
Calcium: 8.9 mg/dL (ref 8.6–10.2)
Chloride: 101 mmol/L (ref 96–106)
Creatinine, Ser: 1.27 mg/dL (ref 0.76–1.27)
GFR calc Af Amer: 63 mL/min/{1.73_m2} (ref >59)
GFR calc non Af Amer: 54 mL/min/{1.73_m2} — ABNORMAL LOW (ref >59)
Glucose: 119 mg/dL — ABNORMAL HIGH (ref 65–99)
Potassium: 4.4 mmol/L (ref 3.5–5.2)
Sodium: 139 mmol/L (ref 134–144)

## 2018-11-16 LAB — LIPID PANEL
Chol/HDL Ratio: 3.9 ratio (ref 0.0–5.0)
Cholesterol, Total: 144 mg/dL (ref 100–199)
HDL: 37 mg/dL — ABNORMAL LOW (ref >39)
LDL Chol Calc (NIH): 87 mg/dL (ref 0–99)
Triglycerides: 107 mg/dL (ref 0–149)
VLDL Cholesterol Cal: 20 mg/dL (ref 5–40)

## 2018-11-16 LAB — CBC
Hematocrit: 39.9 % (ref 37.5–51.0)
Hemoglobin: 13.2 g/dL (ref 13.0–17.7)
MCH: 30 pg (ref 26.6–33.0)
MCHC: 33.1 g/dL (ref 31.5–35.7)
MCV: 91 fL (ref 79–97)
Platelets: 196 10*3/uL (ref 150–450)
RBC: 4.4 x10E6/uL (ref 4.14–5.80)
RDW: 13.2 % (ref 11.6–15.4)
WBC: 5.5 10*3/uL (ref 3.4–10.8)

## 2018-11-16 LAB — TSH: TSH: 2.99 u[IU]/mL (ref 0.450–4.500)

## 2018-11-22 ENCOUNTER — Telehealth: Payer: Self-pay

## 2018-11-22 NOTE — Telephone Encounter (Signed)
Left message that results were good. Copy sent to Dr. Alyson Ingles per Dr. Docia Furl

## 2018-11-22 NOTE — Telephone Encounter (Signed)
-----   Message from Jenean Lindau, MD sent at 11/16/2018  4:54 PM EDT ----- The results of the study is unremarkable. Please inform patient. I will discuss in detail at next appointment. Cc  primary care/referring physician Jenean Lindau, MD 11/16/2018 4:54 PM

## 2018-11-29 ENCOUNTER — Other Ambulatory Visit: Payer: Self-pay

## 2018-11-29 DIAGNOSIS — J069 Acute upper respiratory infection, unspecified: Secondary | ICD-10-CM | POA: Diagnosis not present

## 2018-11-29 DIAGNOSIS — Z20822 Contact with and (suspected) exposure to covid-19: Secondary | ICD-10-CM

## 2018-12-01 LAB — NOVEL CORONAVIRUS, NAA: SARS-CoV-2, NAA: NOT DETECTED

## 2018-12-03 DIAGNOSIS — I4819 Other persistent atrial fibrillation: Secondary | ICD-10-CM | POA: Diagnosis not present

## 2018-12-03 DIAGNOSIS — M189 Osteoarthritis of first carpometacarpal joint, unspecified: Secondary | ICD-10-CM | POA: Diagnosis not present

## 2018-12-03 DIAGNOSIS — E782 Mixed hyperlipidemia: Secondary | ICD-10-CM | POA: Diagnosis not present

## 2018-12-03 DIAGNOSIS — E1129 Type 2 diabetes mellitus with other diabetic kidney complication: Secondary | ICD-10-CM | POA: Diagnosis not present

## 2018-12-03 DIAGNOSIS — N400 Enlarged prostate without lower urinary tract symptoms: Secondary | ICD-10-CM | POA: Diagnosis not present

## 2018-12-03 DIAGNOSIS — I129 Hypertensive chronic kidney disease with stage 1 through stage 4 chronic kidney disease, or unspecified chronic kidney disease: Secondary | ICD-10-CM | POA: Diagnosis not present

## 2018-12-03 DIAGNOSIS — N183 Chronic kidney disease, stage 3 (moderate): Secondary | ICD-10-CM | POA: Diagnosis not present

## 2018-12-18 DIAGNOSIS — Z23 Encounter for immunization: Secondary | ICD-10-CM | POA: Diagnosis not present

## 2019-02-04 DIAGNOSIS — M189 Osteoarthritis of first carpometacarpal joint, unspecified: Secondary | ICD-10-CM | POA: Diagnosis not present

## 2019-02-04 DIAGNOSIS — E782 Mixed hyperlipidemia: Secondary | ICD-10-CM | POA: Diagnosis not present

## 2019-02-04 DIAGNOSIS — N400 Enlarged prostate without lower urinary tract symptoms: Secondary | ICD-10-CM | POA: Diagnosis not present

## 2019-02-04 DIAGNOSIS — I4819 Other persistent atrial fibrillation: Secondary | ICD-10-CM | POA: Diagnosis not present

## 2019-02-04 DIAGNOSIS — E1129 Type 2 diabetes mellitus with other diabetic kidney complication: Secondary | ICD-10-CM | POA: Diagnosis not present

## 2019-02-04 DIAGNOSIS — I129 Hypertensive chronic kidney disease with stage 1 through stage 4 chronic kidney disease, or unspecified chronic kidney disease: Secondary | ICD-10-CM | POA: Diagnosis not present

## 2019-02-10 ENCOUNTER — Other Ambulatory Visit: Payer: Self-pay | Admitting: Cardiology

## 2019-02-21 ENCOUNTER — Ambulatory Visit: Payer: Medicare HMO | Admitting: Podiatry

## 2019-02-21 ENCOUNTER — Other Ambulatory Visit: Payer: Self-pay

## 2019-02-21 DIAGNOSIS — G8929 Other chronic pain: Secondary | ICD-10-CM | POA: Diagnosis not present

## 2019-02-21 DIAGNOSIS — L600 Ingrowing nail: Secondary | ICD-10-CM | POA: Diagnosis not present

## 2019-02-21 DIAGNOSIS — M79675 Pain in left toe(s): Secondary | ICD-10-CM | POA: Diagnosis not present

## 2019-02-21 MED ORDER — CEPHALEXIN 500 MG PO CAPS: 500.0000 mg | ORAL_CAPSULE | Freq: Three times a day (TID) | ORAL | 0 refills | Status: DC

## 2019-02-21 NOTE — Patient Instructions (Signed)

## 2019-02-28 NOTE — Progress Notes (Signed)
Subjective:   Patient ID: Michael Mejia, male   DOB: 79 y.o.   MRN: [id number]   HPI  79 year old male presents the office today for concerns of ingrown toenails left big toe, lateral aspect which is been ongoing for the last several days.  He has tried soaking it without relief.  He states it is tender to touch.  He had some swelling and he did notice some pus previously.  He is on Xarelto.  Has had issues with his toes since he was 79 years old.  Review of Systems  All other systems reviewed and are negative.  Past Medical History:  Diagnosis Date  . Afib ([place])   . Arthritis   . Hypercholesteremia     Past Surgical History:  Procedure Laterality Date  . BOWEL RESECTION  10/24/2011   Procedure: SMALL BOWEL RESECTION;  Surgeon: Pedro Earls, MD;  Location: [facility];  Service: General;;  for small bowel perforation   . CHOLECYSTECTOMY N/A 02/17/2013   Procedure: LAPAROSCOPIC CHOLECYSTECTOMY WITH INTRAOPERATIVE CHOLANGIOGRAM;  Surgeon: Pedro Earls, MD;  Location: [facility];  Service: General;  Laterality: N/A;  . ERCP N/A 02/15/2013   Procedure: ENDOSCOPIC RETROGRADE CHOLANGIOPANCREATOGRAPHY (ERCP);  Surgeon: Jeryl Columbia, MD;  Location: [facility];  Service: Endoscopy;  Laterality: N/A;  . LAPAROTOMY  10/24/2011   Procedure: EXPLORATORY LAPAROTOMY;  Surgeon: Pedro Earls, MD;  Location: [facility];  Service: General;  Laterality: N/A;  . ROTATOR CUFF REPAIR       Current Outpatient Medications:  .  Alcohol Swabs (ALCOHOL WIPES) 70 % PADS, BD Alcohol Swabs, Disp: , Rfl:  .  fluticasone (FLONASE) 50 MCG/ACT nasal spray, Place 1 spray into both nostrils daily., Disp: , Rfl:  .  ipratropium (ATROVENT) 0.03 % nasal spray, spray 2 spray by intranasal route 3 times every day in each nostril, Disp: , Rfl:  .  loratadine (CLARITIN) 10 MG tablet, Take 10 mg by mouth daily., Disp: , Rfl:  .  losartan (COZAAR) 100 MG tablet, Take 0.5 tablets (50 mg total) by mouth daily., Disp: 90 tablet,  Rfl: 0 .  metFORMIN (GLUCOPHAGE) 500 MG tablet, Take 500 mg by mouth daily., Disp: , Rfl: 0 .  simvastatin (ZOCOR) 40 MG tablet, Take 1 tablet (40 mg total) by mouth every evening., Disp: 90 tablet, Rfl: 2 .  Tamsulosin HCl (FLOMAX) 0.4 MG CAPS, Take 0.4 mg by mouth every evening., Disp: , Rfl:  .  XARELTO 20 MG TABS tablet, TAKE 1 TABLET DAILY WITH SUPPER., Disp: 90 tablet, Rfl: 2 .  cephALEXin (KEFLEX) 500 MG capsule, Take 1 capsule (500 mg total) by mouth 3 (three) times daily., Disp: 21 capsule, Rfl: 0  Allergies  Allergen Reactions  . Codeine Nausea Only  . Lactose Intolerance (Gi) Other (See Comments)    Gas pain         Objective:  Physical Exam  General: AAO x3, NAD  Dermatological: Patient present left hallux toenail mostly on the lateral aspect.  There is localized edema and erythema but no drainage or pus identified today.  No ascending cellulitis.  No open lesions.  Vascular: Dorsalis Pedis artery and Posterior Tibial artery pedal pulses are 2/4 bilateral with immedate capillary fill time.There is no pain with calf compression, swelling, warmth, erythema.   Neruologic: Grossly intact via light touch bilateral.  Musculoskeletal: No gross boney pedal deformities bilateral. No pain, crepitus, or limitation noted with foot and ankle range of motion bilateral. Muscular strength 5/5 in  all groups tested bilateral.  Gait: Unassisted, Nonantalgic.       Assessment:   Ingrown toenail     Plan:  -Treatment options discussed including all alternatives, risks, and complications -Etiology of symptoms were discussed -At this time, the patient is requesting partial nail removal with chemical matricectomy to the symptomatic portion of the nail. Risks and complications were discussed with the patient for which they understand and written consent was obtained. Under sterile conditions a total of 3 mL of a mixture of 2% lidocaine plain and 0.5% Marcaine plain was infiltrated in a  hallux block fashion. Once anesthetized, the skin was prepped in sterile fashion. A tourniquet was then applied. Next the symptomatic aspect of hallux nail border was then sharply excised making sure to remove the entire offending nail border. Once the nails were ensured to be removed area was debrided and the underlying skin was intact. There is no purulence identified in the procedure. Next phenol was then applied under standard conditions and copiously irrigated. Silvadene was applied. A dry sterile dressing was applied. After application of the dressing the tourniquet was removed and there is found to be an immediate capillary refill time to the digit. The patient tolerated the procedure well any complications. Post procedure instructions were discussed the patient for which he verbally understood. Follow-up in one week for nail check or sooner if any problems are to arise. Discussed signs/symptoms of infection and directed to call the office immediately should any occur or go directly to the emergency room. In the meantime, encouraged to call the office with any questions, concerns, changes symptoms. -Keflex  Return in about 2 weeks (around 03/07/2019) for nail check-left big toe.  Trula Slade DPM

## 2019-03-07 ENCOUNTER — Ambulatory Visit: Payer: Medicare HMO | Admitting: Podiatry

## 2019-03-07 ENCOUNTER — Other Ambulatory Visit: Payer: Self-pay

## 2019-03-07 ENCOUNTER — Encounter: Payer: Self-pay | Admitting: Podiatry

## 2019-03-07 DIAGNOSIS — M79675 Pain in left toe(s): Secondary | ICD-10-CM

## 2019-03-07 DIAGNOSIS — G8929 Other chronic pain: Secondary | ICD-10-CM

## 2019-03-07 DIAGNOSIS — L600 Ingrowing nail: Secondary | ICD-10-CM

## 2019-03-07 MED ORDER — CEPHALEXIN 500 MG PO CAPS: 500.0000 mg | ORAL_CAPSULE | Freq: Three times a day (TID) | ORAL | 0 refills | Status: DC

## 2019-03-08 DIAGNOSIS — E1129 Type 2 diabetes mellitus with other diabetic kidney complication: Secondary | ICD-10-CM | POA: Diagnosis not present

## 2019-03-08 DIAGNOSIS — I129 Hypertensive chronic kidney disease with stage 1 through stage 4 chronic kidney disease, or unspecified chronic kidney disease: Secondary | ICD-10-CM | POA: Diagnosis not present

## 2019-03-08 DIAGNOSIS — E782 Mixed hyperlipidemia: Secondary | ICD-10-CM | POA: Diagnosis not present

## 2019-03-08 DIAGNOSIS — N183 Chronic kidney disease, stage 3 unspecified: Secondary | ICD-10-CM | POA: Diagnosis not present

## 2019-03-08 DIAGNOSIS — N400 Enlarged prostate without lower urinary tract symptoms: Secondary | ICD-10-CM | POA: Diagnosis not present

## 2019-03-08 DIAGNOSIS — M189 Osteoarthritis of first carpometacarpal joint, unspecified: Secondary | ICD-10-CM | POA: Diagnosis not present

## 2019-03-08 NOTE — Progress Notes (Signed)
Subjective: Michael Mejia is a 79 y.o.  male returns to office today for follow up evaluation after having left Hallux total  nail avulsion performed. Patient has been soaking using epsom salts and applying topical antibiotic covered with bandaid daily.  He does state he is been on his feet a lot more recently as he started the remodeling project.  He thinks that that since he is on his feet more she has had some mild increase in pain and redness.  No drainage or pus.  No red streaks.  Patient denies fevers, chills, nausea, vomiting. Denies any calf pain, chest pain, SOB.   Objective:  Vitals: Reviewed  General: Well developed, nourished, in no acute distress, alert and oriented x3   Dermatology: Skin is warm, dry and supple bilateral. LEFT hallux nail bed appears to be clean, dry, with mild granular tissue and surrounding scab. There is mildsurrounding erythema however there is no ascending cellulitis, edema, drainage/purulence. The remaining nails appear unremarkable at this time. There are no other lesions or other signs of infection present.  Neurovascular status: Intact. No lower extremity swelling; No pain with calf compression bilateral.  Musculoskeletal: Minimal tenderness to palpation of the left hallux nail bed. Muscular strength within normal limits bilateral.   Assesement and Plan: S/p partial nail avulsion, doing well.   -Continue soaking in epsom salts twice a day followed by antibiotic ointment and a band-aid. Can leave uncovered at night. Continue this until completely healed.  -We will do 1 more week of Keflex given the mild increase in pain there is mild erythema although the erythema is more inflammation as opposed to infection since he is been on his feet more recently.  Dispensed offloading pad and recommend stay off of his feet as much as possible to let this heal. -If the area has not healed in 2 weeks, call the office for follow-up appointment, or sooner if any problems  arise.  -Monitor for any signs/symptoms of infection. Call the office immediately if any occur or go directly to the emergency room. Call with any questions/concerns.  Celesta Gentile, DPM

## 2019-03-20 DIAGNOSIS — L2489 Irritant contact dermatitis due to other agents: Secondary | ICD-10-CM | POA: Diagnosis not present

## 2019-04-09 DIAGNOSIS — E1129 Type 2 diabetes mellitus with other diabetic kidney complication: Secondary | ICD-10-CM | POA: Diagnosis not present

## 2019-04-09 DIAGNOSIS — Z1389 Encounter for screening for other disorder: Secondary | ICD-10-CM | POA: Diagnosis not present

## 2019-04-09 DIAGNOSIS — Z0000 Encounter for general adult medical examination without abnormal findings: Secondary | ICD-10-CM | POA: Diagnosis not present

## 2019-04-09 DIAGNOSIS — I4819 Other persistent atrial fibrillation: Secondary | ICD-10-CM | POA: Diagnosis not present

## 2019-04-09 DIAGNOSIS — I129 Hypertensive chronic kidney disease with stage 1 through stage 4 chronic kidney disease, or unspecified chronic kidney disease: Secondary | ICD-10-CM | POA: Diagnosis not present

## 2019-04-09 DIAGNOSIS — E782 Mixed hyperlipidemia: Secondary | ICD-10-CM | POA: Diagnosis not present

## 2019-04-09 DIAGNOSIS — D6869 Other thrombophilia: Secondary | ICD-10-CM | POA: Diagnosis not present

## 2019-04-09 DIAGNOSIS — N400 Enlarged prostate without lower urinary tract symptoms: Secondary | ICD-10-CM | POA: Diagnosis not present

## 2019-04-12 DIAGNOSIS — L400 Psoriasis vulgaris: Secondary | ICD-10-CM | POA: Diagnosis not present

## 2019-04-12 DIAGNOSIS — L821 Other seborrheic keratosis: Secondary | ICD-10-CM | POA: Diagnosis not present

## 2019-05-17 DIAGNOSIS — S51801A Unspecified open wound of right forearm, initial encounter: Secondary | ICD-10-CM | POA: Diagnosis not present

## 2019-05-28 ENCOUNTER — Other Ambulatory Visit: Payer: Self-pay

## 2019-05-28 ENCOUNTER — Ambulatory Visit: Payer: Medicare HMO | Admitting: Cardiology

## 2019-05-28 VITALS — BP 108/64 | HR 88 | Temp 97.2°F | Ht 69.0 in | Wt 192.2 lb

## 2019-05-28 DIAGNOSIS — I4821 Permanent atrial fibrillation: Secondary | ICD-10-CM | POA: Diagnosis not present

## 2019-05-28 DIAGNOSIS — E088 Diabetes 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: Secondary | ICD-10-CM

## 2019-05-28 DIAGNOSIS — E782 Mixed hyperlipidemia: Secondary | ICD-10-CM

## 2019-05-28 HISTORY — DX: Diabetes 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: E08.8

## 2019-05-28 NOTE — Patient Instructions (Signed)
Medication Instructions:  No medication chnages *If you need a refill on your cardiac medications before your next appointment, please call your pharmacy*   Lab Work: None ordered If you have labs (blood work) drawn today and your tests are completely normal, you will receive your results only by: Marland Kitchen MyChart Message (if you have MyChart) OR . A paper copy in the mail If you have any lab test that is abnormal or we need to change your treatment, we will call you to review the results.   Testing/Procedures: None ordered   Follow-Up: At [facility], you and your health needs are our priority.  As part of our continuing mission to provide you with exceptional heart care, we have created designated Provider Care Teams.  These Care Teams include your primary Cardiologist (physician) and Advanced Practice Providers (APPs -  Physician Assistants and Nurse Practitioners) who all work together to provide you with the care you need, when you need it.  We recommend signing up for the patient portal called "MyChart".  Sign up information is provided on this After Visit Summary.  MyChart is used to connect with patients for Virtual Visits (Telemedicine).  Patients are able to view lab/test results, encounter notes, upcoming appointments, etc.  Non-urgent messages can be sent to your provider as well.   To learn more about what you can do with MyChart, go to [email or web address].    Your next appointment:   6 month(s)  The format for your next appointment:   In Person  Provider:   Jyl Heinz, MD   Other Instructions NA

## 2019-05-28 NOTE — Progress Notes (Signed)
Cardiology Office Note:    Date:  05/28/2019   ID:  Michael Mejia, DOB [birth year], MRN [id number]  PCP:  Maury Dus, MD  Cardiologist:  Jenean Lindau, MD   Referring MD: Maury Dus, MD    ASSESSMENT:    1. Permanent atrial fibrillation ([place])   2. Mixed dyslipidemia   3. 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 Primary prevention stressed with the patient.  Importance of compliance with diet medication stressed and he vocalized understanding.  Importance of regular exercise stressed and he promises to do so. 2. Permanent atrial fibrillation:I discussed with the patient atrial fibrillation, disease process. Management and therapy including rate and rhythm control, anticoagulation benefits and potential risks were discussed extensively with the patient. Patient had multiple questions which were answered to patient's satisfaction. 3. Mixed dyslipidemia: I reviewed lab work from the past and discussed it with the patient.  Labs are followed by primary care physician. 4. Diabetes mellitus: His hemoglobin A1c is 7 and I cautioned him about this.  He will follow this with his primary care physician diligently. 5. Patient will be seen in follow-up appointment in 6 months or earlier if the patient has any concerns    Medication Adjustments/Labs and Tests Ordered: Current medicines are reviewed at length with the patient today.  Concerns regarding medicines are outlined above.  No orders of the defined types were placed in this encounter.  No orders of the defined types were placed in this encounter.    No chief complaint on file.    History of Present Illness:    Michael Mejia is a 79 y.o. male.  Patient denies any problems at this time and takes care of activities of daily living.  No chest pain orthopnea or PND.  At the time of my evaluation, the patient is alert awake oriented and in  no distress.  He denies any palpitations or any such issues.  Past Medical History:  Diagnosis Date  . 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 Afib ([place])   . Allergic rhinitis 07/24/2013  . 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 Allergic rhinitis due to pollen 07/24/2013  . Arthritis   . Complaining of cough 07/24/2013  . Deformity of metatarsal bone of left foot 11/08/2013  . Hypercholesteremia   . Lap chole Jan 2015 02/17/2013  . Mixed dyslipidemia 12/08/2017  . Perforated viscus 10/23/2011   Perforated jejunal diverticulum   . Permanent atrial fibrillation ([place]) 12/08/2017  . Plantar fasciitis of left foot 09/03/2013    Past Surgical History:  Procedure Laterality Date  . BOWEL RESECTION  10/24/2011   Procedure: SMALL BOWEL RESECTION;  Surgeon: Pedro Earls, MD;  Location: [facility];  Service: General;;  for small bowel perforation   . CHOLECYSTECTOMY N/A 02/17/2013   Procedure: LAPAROSCOPIC CHOLECYSTECTOMY WITH INTRAOPERATIVE CHOLANGIOGRAM;  Surgeon: Pedro Earls, MD;  Location: [facility];  Service: General;  Laterality: N/A;  . ERCP N/A 02/15/2013   Procedure: ENDOSCOPIC RETROGRADE CHOLANGIOPANCREATOGRAPHY (ERCP);  Surgeon: Jeryl Columbia, MD;  Location: [facility];  Service: Endoscopy;  Laterality: N/A;  . LAPAROTOMY  10/24/2011   Procedure: EXPLORATORY LAPAROTOMY;  Surgeon: Pedro Earls, MD;  Location: [facility];  Service: General;  Laterality: N/A;  . ROTATOR CUFF REPAIR      Current Medications: Current Meds  Medication Sig  . augmented betamethasone dipropionate (DIPROLENE-AF) 0.05 %  cream   . losartan (COZAAR) 100 MG tablet Take 0.5 tablets (50 mg total) by mouth daily.  . metFORMIN (GLUCOPHAGE) 500 MG tablet Take by mouth 2 (two) times daily with a meal.  . simvastatin (ZOCOR) 40 MG tablet Take 1 tablet (40 mg total) by mouth every evening.  . Tamsulosin HCl (FLOMAX) 0.4 MG CAPS Take 0.4 mg by mouth every evening.  Alveda Reasons 20 MG TABS tablet TAKE 1 TABLET  DAILY WITH SUPPER.     Allergies:   Codeine and Lactose intolerance (gi)   Social History   Socioeconomic History  . Marital status: Married    Spouse name: Not on file  . Number of children: Not on file  . Years of education: Not on file  . Highest education level: Not on file  Occupational History  . Not on file  Tobacco Use  . Smoking status: Former Research scientist (life sciences)  . Smokeless tobacco: Never Used  Substance and Sexual Activity  . Alcohol use: No  . Drug use: No  . Sexual activity: Never  Other Topics Concern  . Not on file  Social History Narrative  . Not on file   Social Determinants of Health   Financial Resource Strain:   . Difficulty of Paying Living Expenses:   Food Insecurity:   . Worried About Charity fundraiser in the Last Year:   . Arboriculturist in the Last Year:   Transportation Needs:   . Film/video editor (Medical):   Marland Kitchen Lack of Transportation (Non-Medical):   Physical Activity:   . Days of Exercise per Week:   . Minutes of Exercise per Session:   Stress:   . Feeling of Stress :   Social Connections:   . Frequency of Communication with Friends and Family:   . Frequency of Social Gatherings with Friends and Family:   . Attends Religious Services:   . Active Member of Clubs or Organizations:   . Attends Archivist Meetings:   Marland Kitchen Marital Status:      Family History: The patient's family history includes Pancreatic cancer in his brother and mother.  ROS:   Please see the history of present illness.    All other systems reviewed and are negative.  EKGs/Labs/Other Studies Reviewed:    The following studies were reviewed today: I discussed my findings with the patient at extensive length.   Recent Labs: 11/15/2018: ALT 9; BUN 19; Creatinine, Ser 1.27; Hemoglobin 13.2; Platelets 196; Potassium 4.4; Sodium 139; TSH 2.990  Recent Lipid Panel    Component Value Date/Time   CHOL 144 11/15/2018 0837   TRIG 107 11/15/2018 0837   HDL 37  (L) 11/15/2018 0837   CHOLHDL 3.9 11/15/2018 0837   LDLCALC 87 11/15/2018 0837    Physical Exam:    VS:  BP 108/64   Pulse 88   Temp (!) 97.2 F (36.2 C)   Ht 5\' 9"  (1.753 m)   Wt 192 lb 3.2 oz (87.2 kg)   SpO2 99%   BMI 28.38 kg/m     Wt Readings from Last 3 Encounters:  05/28/19 192 lb 3.2 oz (87.2 kg)  11/15/18 196 lb (88.9 kg)  12/08/17 194 lb (88 kg)     GEN: Patient is in no acute distress HEENT: Normal NECK: No JVD; No carotid bruits LYMPHATICS: No lymphadenopathy CARDIAC: Hear sounds irregular, 2/6 systolic murmur at the apex. RESPIRATORY:  Clear to auscultation without rales, wheezing or rhonchi  ABDOMEN: Soft, non-tender, non-distended  MUSCULOSKELETAL:  No edema; No deformity  SKIN: Warm and dry NEUROLOGIC:  Alert and oriented x 3 PSYCHIATRIC:  Normal affect   Signed, Jenean Lindau, MD  05/28/2019 3:34 PM    [facility]

## 2019-06-06 DIAGNOSIS — L308 Other specified dermatitis: Secondary | ICD-10-CM | POA: Diagnosis not present

## 2019-06-06 DIAGNOSIS — L309 Dermatitis, unspecified: Secondary | ICD-10-CM | POA: Diagnosis not present

## 2019-07-03 DIAGNOSIS — I482 Chronic atrial fibrillation, unspecified: Secondary | ICD-10-CM | POA: Diagnosis not present

## 2019-07-03 DIAGNOSIS — E1129 Type 2 diabetes mellitus with other diabetic kidney complication: Secondary | ICD-10-CM | POA: Diagnosis not present

## 2019-07-03 DIAGNOSIS — M189 Osteoarthritis of first carpometacarpal joint, unspecified: Secondary | ICD-10-CM | POA: Diagnosis not present

## 2019-07-03 DIAGNOSIS — N400 Enlarged prostate without lower urinary tract symptoms: Secondary | ICD-10-CM | POA: Diagnosis not present

## 2019-07-03 DIAGNOSIS — I129 Hypertensive chronic kidney disease with stage 1 through stage 4 chronic kidney disease, or unspecified chronic kidney disease: Secondary | ICD-10-CM | POA: Diagnosis not present

## 2019-07-03 DIAGNOSIS — E782 Mixed hyperlipidemia: Secondary | ICD-10-CM | POA: Diagnosis not present

## 2019-07-03 DIAGNOSIS — N1831 Chronic kidney disease, stage 3a: Secondary | ICD-10-CM | POA: Diagnosis not present

## 2019-07-03 DIAGNOSIS — N183 Chronic kidney disease, stage 3 unspecified: Secondary | ICD-10-CM | POA: Diagnosis not present

## 2019-10-09 DIAGNOSIS — N400 Enlarged prostate without lower urinary tract symptoms: Secondary | ICD-10-CM | POA: Diagnosis not present

## 2019-10-09 DIAGNOSIS — E1129 Type 2 diabetes mellitus with other diabetic kidney complication: Secondary | ICD-10-CM | POA: Diagnosis not present

## 2019-10-09 DIAGNOSIS — I129 Hypertensive chronic kidney disease with stage 1 through stage 4 chronic kidney disease, or unspecified chronic kidney disease: Secondary | ICD-10-CM | POA: Diagnosis not present

## 2019-10-09 DIAGNOSIS — M189 Osteoarthritis of first carpometacarpal joint, unspecified: Secondary | ICD-10-CM | POA: Diagnosis not present

## 2019-10-09 DIAGNOSIS — E782 Mixed hyperlipidemia: Secondary | ICD-10-CM | POA: Diagnosis not present

## 2019-10-09 DIAGNOSIS — N1831 Chronic kidney disease, stage 3a: Secondary | ICD-10-CM | POA: Diagnosis not present

## 2019-10-09 DIAGNOSIS — D6869 Other thrombophilia: Secondary | ICD-10-CM | POA: Diagnosis not present

## 2019-10-09 DIAGNOSIS — I4819 Other persistent atrial fibrillation: Secondary | ICD-10-CM | POA: Diagnosis not present

## 2019-11-04 ENCOUNTER — Other Ambulatory Visit: Payer: Self-pay | Admitting: Cardiology

## 2019-11-05 DIAGNOSIS — N1831 Chronic kidney disease, stage 3a: Secondary | ICD-10-CM | POA: Diagnosis not present

## 2019-11-05 DIAGNOSIS — E782 Mixed hyperlipidemia: Secondary | ICD-10-CM | POA: Diagnosis not present

## 2019-11-05 DIAGNOSIS — E1129 Type 2 diabetes mellitus with other diabetic kidney complication: Secondary | ICD-10-CM | POA: Diagnosis not present

## 2019-11-05 DIAGNOSIS — M189 Osteoarthritis of first carpometacarpal joint, unspecified: Secondary | ICD-10-CM | POA: Diagnosis not present

## 2019-11-05 DIAGNOSIS — I4819 Other persistent atrial fibrillation: Secondary | ICD-10-CM | POA: Diagnosis not present

## 2019-11-05 DIAGNOSIS — N400 Enlarged prostate without lower urinary tract symptoms: Secondary | ICD-10-CM | POA: Diagnosis not present

## 2019-11-05 DIAGNOSIS — N183 Chronic kidney disease, stage 3 unspecified: Secondary | ICD-10-CM | POA: Diagnosis not present

## 2019-11-05 DIAGNOSIS — I129 Hypertensive chronic kidney disease with stage 1 through stage 4 chronic kidney disease, or unspecified chronic kidney disease: Secondary | ICD-10-CM | POA: Diagnosis not present

## 2019-12-03 ENCOUNTER — Other Ambulatory Visit: Payer: Self-pay

## 2019-12-03 DIAGNOSIS — E7800 Pure hypercholesterolemia, unspecified: Secondary | ICD-10-CM | POA: Insufficient documentation

## 2019-12-03 DIAGNOSIS — I4891 Unspecified atrial fibrillation: Secondary | ICD-10-CM | POA: Insufficient documentation

## 2019-12-03 DIAGNOSIS — M1990 Unspecified osteoarthritis, unspecified site: Secondary | ICD-10-CM | POA: Insufficient documentation

## 2019-12-04 ENCOUNTER — Encounter: Payer: Self-pay | Admitting: Cardiology

## 2019-12-04 ENCOUNTER — Other Ambulatory Visit: Payer: Self-pay

## 2019-12-04 ENCOUNTER — Ambulatory Visit: Payer: Medicare HMO | Admitting: Cardiology

## 2019-12-04 VITALS — BP 108/64 | HR 71 | Ht 69.0 in | Wt 184.6 lb

## 2019-12-04 DIAGNOSIS — I4821 Permanent atrial fibrillation: Secondary | ICD-10-CM

## 2019-12-04 DIAGNOSIS — E782 Mixed hyperlipidemia: Secondary | ICD-10-CM | POA: Diagnosis not present

## 2019-12-04 NOTE — Progress Notes (Signed)
Cardiology Office Note:    Date:  12/04/2019   ID:  Michael Mejia, DOB 12/16/1940, MRN [id number]  PCP:  Maury Dus, MD  Cardiologist:  Jenean Lindau, MD   Referring MD: Maury Dus, MD    ASSESSMENT:    1. Permanent atrial fibrillation ([place])   2. Mixed dyslipidemia    PLAN:    In order of problems listed above:  1. Primary prevention stressed with the patient.  Importance of compliance with diet medication stressed and he vocalized understanding.  He was advised to walk at least half an hour on a daily basis. 2. Permanent atrial fibrillation:I discussed with the patient atrial fibrillation, disease process. Management and therapy including rate and rhythm control, anticoagulation benefits and potential risks were discussed extensively with the patient. Patient had multiple questions which were answered to patient's satisfaction. 3. Mixed dyslipidemia: Diet was emphasized.  Lipids followed by primary care physician.  He is fasting and will have blood work today and will send a copy to primary care.  He is on statin therapy. 4. Diabetes mellitus: On Metformin.  Managed by primary care physician.  Diet discussed. 5. Patient will be seen in follow-up appointment in 6 months or earlier if the patient has any concerns    Medication Adjustments/Labs and Tests Ordered: Current medicines are reviewed at length with the patient today.  Concerns regarding medicines are outlined above.  No orders of the defined types were placed in this encounter.  No orders of the defined types were placed in this encounter.    No chief complaint on file.    History of Present Illness:    Michael Mejia is a 79 y.o. male.  Patient has past medical history of essential hypertension, diabetes mellitus and permanent atrial fibrillation.  He denies any problems at this time and takes care of activities of daily living.  No chest pain orthopnea or PND.  At the time of my evaluation, the patient is  alert awake oriented and in no distress.  He is an active gentleman and walks on a regular basis.  Past Medical History:  Diagnosis Date   Acquired bilateral pes cavus 09/03/2013   Afib ([place])    Allergic rhinitis 07/24/2013   Allergic rhinitis due to animal dander 07/24/2013   Allergic rhinitis due to pollen 07/24/2013   Arthritis    Complaining of cough 07/24/2013   Deformity of metatarsal bone of left foot 11/08/2013   Diabetes 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 ([place]) 3/53/6144   Hypercholesteremia    Lap chole Jan 2015 02/17/2013   Mixed dyslipidemia 12/08/2017   Perforated viscus 10/23/2011   Perforated jejunal diverticulum    Permanent atrial fibrillation ([place]) 12/08/2017   Plantar fasciitis of left foot 09/03/2013    Past Surgical History:  Procedure Laterality Date   BOWEL RESECTION  10/24/2011   Procedure: SMALL BOWEL RESECTION;  Surgeon: Pedro Earls, MD;  Location: [facility];  Service: General;;  for small bowel perforation    CHOLECYSTECTOMY N/A 02/17/2013   Procedure: LAPAROSCOPIC CHOLECYSTECTOMY WITH INTRAOPERATIVE CHOLANGIOGRAM;  Surgeon: Pedro Earls, MD;  [facility]: [facility];  Service: General;  Laterality: N/A;   ERCP N/A 02/15/2013   Procedure: ENDOSCOPIC RETROGRADE CHOLANGIOPANCREATOGRAPHY (ERCP);  Surgeon: Jeryl Columbia, MD;  Location: [facility];  Service: Endoscopy;  Laterality: N/A;   LAPAROTOMY  10/24/2011   Procedure: EXPLORATORY LAPAROTOMY;  Surgeon: Pedro Earls, MD;  [facility]: [facility];  Service: General;  Laterality: N/A;  ROTATOR CUFF REPAIR      Current Medications: Current Meds  Medication Sig   losartan (COZAAR) 50 MG tablet Take by mouth.   metFORMIN (GLUCOPHAGE) 500 MG tablet Take 500 mg by mouth 2 (two) times daily with a meal.    Multiple Vitamin (MULTIVITAMIN) tablet Take 1 tablet by mouth daily.   simvastatin (ZOCOR) 40 MG tablet Take 1 tablet (40 mg total) by mouth every evening.    Tamsulosin HCl (FLOMAX) 0.4 MG CAPS Take 0.4 mg by mouth every evening.   XARELTO 20 MG TABS tablet TAKE 1 TABLET DAILY WITH SUPPER.     Allergies:   Codeine and Lactose intolerance (gi)   Social History   Socioeconomic History   Marital status: Married    Spouse name: Not on file   Number of children: Not on file   Years of education: Not on file   Highest education level: Not on file  Occupational History   Not on file  Tobacco Use   Smoking status: Former Smoker   Smokeless tobacco: Never Used  Substance and Sexual Activity   Alcohol use: No   Drug use: No   Sexual activity: Never  Other Topics Concern   Not on file  Social History Narrative   Not on file   Social Determinants of Health   Financial Resource Strain:    Difficulty of Paying Living Expenses: Not on file  Food Insecurity:    Worried About Charity fundraiser in the Last Year: Not on file   [employer] of Food in the Last Year: Not on file  Transportation Needs:    Lack of Transportation (Medical): Not on file   Lack of Transportation (Non-Medical): Not on file  Physical Activity:    Days of Exercise per Week: Not on file   Minutes of Exercise per Session: Not on file  Stress:    Feeling of Stress : Not on file  Social Connections:    Frequency of Communication with Friends and Family: Not on file   Frequency of Social Gatherings with Friends and Family: Not on file   Attends Religious Services: Not on file   Active Member of Clubs or Organizations: Not on file   Attends Archivist Meetings: Not on file   Marital Status: Not on file     Family History: The patient's family history includes Pancreatic cancer in his brother and mother.  ROS:   Please see the history of present illness.    All other systems reviewed and are negative.  EKGs/Labs/Other Studies Reviewed:    The following studies were reviewed today: EKG reveals atrial fibrillation with  well-controlled ventricular rate.   Recent Labs: No results found for requested labs within last 8760 hours.  Recent Lipid Panel    Component Value Date/Time   CHOL 144 11/15/2018 0837   TRIG 107 11/15/2018 0837   HDL 37 (L) 11/15/2018 0837   CHOLHDL 3.9 11/15/2018 0837   LDLCALC 87 11/15/2018 0837    Physical Exam:    VS:  BP 108/64    Pulse 71    Ht 5\' 9"  (1.753 m)    Wt 184 lb 9.6 oz (83.7 kg)    SpO2 99%    BMI 27.26 kg/m     Wt Readings from Last 3 Encounters:  12/04/19 184 lb 9.6 oz (83.7 kg)  05/28/19 192 lb 3.2 oz (87.2 kg)  11/15/18 196 lb (88.9 kg)     GEN: Patient is in  no acute distress HEENT: Normal NECK: No JVD; No carotid bruits LYMPHATICS: No lymphadenopathy CARDIAC: Hear sounds regular, 2/6 systolic murmur at the apex. RESPIRATORY:  Clear to auscultation without rales, wheezing or rhonchi  ABDOMEN: Soft, non-tender, non-distended MUSCULOSKELETAL:  No edema; No deformity  SKIN: Warm and dry NEUROLOGIC:  Alert and oriented x 3 PSYCHIATRIC:  Normal affect   Signed, Jenean Lindau, MD  12/04/2019 9:39 AM    [place]

## 2019-12-04 NOTE — Patient Instructions (Signed)

## 2019-12-05 LAB — CBC WITH DIFFERENTIAL/PLATELET
Basophils Absolute: 0 10*3/uL (ref 0.0–0.2)
Basos: 1 %
EOS (ABSOLUTE): 0.2 10*3/uL (ref 0.0–0.4)
Eos: 3 %
Hematocrit: 45.8 % (ref 37.5–51.0)
Hemoglobin: 14.8 g/dL (ref 13.0–17.7)
Immature Grans (Abs): 0 10*3/uL (ref 0.0–0.1)
Immature Granulocytes: 0 %
Lymphocytes Absolute: 1.5 10*3/uL (ref 0.7–3.1)
Lymphs: 27 %
MCH: 29.7 pg (ref 26.6–33.0)
MCHC: 32.3 g/dL (ref 31.5–35.7)
MCV: 92 fL (ref 79–97)
Monocytes Absolute: 0.5 10*3/uL (ref 0.1–0.9)
Monocytes: 9 %
Neutrophils Absolute: 3.3 10*3/uL (ref 1.4–7.0)
Neutrophils: 60 %
Platelets: 215 10*3/uL (ref 150–450)
RBC: 4.98 x10E6/uL (ref 4.14–5.80)
RDW: 12.4 % (ref 11.6–15.4)
WBC: 5.5 10*3/uL (ref 3.4–10.8)

## 2019-12-05 LAB — HEPATIC FUNCTION PANEL
ALT: 8 IU/L (ref 0–44)
AST: 16 IU/L (ref 0–40)
Albumin: 4.2 g/dL (ref 3.7–4.7)
Alkaline Phosphatase: 70 IU/L (ref 44–121)
Bilirubin Total: 0.6 mg/dL (ref 0.0–1.2)
Bilirubin, Direct: 0.19 mg/dL (ref 0.00–0.40)
Total Protein: 7.2 g/dL (ref 6.0–8.5)

## 2019-12-05 LAB — BASIC METABOLIC PANEL
BUN/Creatinine Ratio: 14 (ref 10–24)
BUN: 19 mg/dL (ref 8–27)
CO2: 25 mmol/L (ref 20–29)
Calcium: 9.2 mg/dL (ref 8.6–10.2)
Chloride: 100 mmol/L (ref 96–106)
Creatinine, Ser: 1.32 mg/dL — ABNORMAL HIGH (ref 0.76–1.27)
GFR calc Af Amer: 59 mL/min/{1.73_m2} — ABNORMAL LOW (ref >59)
GFR calc non Af Amer: 51 mL/min/{1.73_m2} — ABNORMAL LOW (ref >59)
Glucose: 125 mg/dL — ABNORMAL HIGH (ref 65–99)
Potassium: 4.8 mmol/L (ref 3.5–5.2)
Sodium: 138 mmol/L (ref 134–144)

## 2019-12-05 LAB — LIPID PANEL
Chol/HDL Ratio: 3.9 ratio (ref 0.0–5.0)
Cholesterol, Total: 158 mg/dL (ref 100–199)
HDL: 41 mg/dL (ref >39)
LDL Chol Calc (NIH): 95 mg/dL (ref 0–99)
Triglycerides: 123 mg/dL (ref 0–149)
VLDL Cholesterol Cal: 22 mg/dL (ref 5–40)

## 2019-12-05 LAB — TSH: TSH: 2.75 u[IU]/mL (ref 0.450–4.500)

## 2019-12-27 DIAGNOSIS — Z23 Encounter for immunization: Secondary | ICD-10-CM | POA: Diagnosis not present

## 2020-01-29 DIAGNOSIS — I129 Hypertensive chronic kidney disease with stage 1 through stage 4 chronic kidney disease, or unspecified chronic kidney disease: Secondary | ICD-10-CM | POA: Diagnosis not present

## 2020-01-29 DIAGNOSIS — M189 Osteoarthritis of first carpometacarpal joint, unspecified: Secondary | ICD-10-CM | POA: Diagnosis not present

## 2020-01-29 DIAGNOSIS — E1129 Type 2 diabetes mellitus with other diabetic kidney complication: Secondary | ICD-10-CM | POA: Diagnosis not present

## 2020-01-29 DIAGNOSIS — N1831 Chronic kidney disease, stage 3a: Secondary | ICD-10-CM | POA: Diagnosis not present

## 2020-01-29 DIAGNOSIS — E782 Mixed hyperlipidemia: Secondary | ICD-10-CM | POA: Diagnosis not present

## 2020-01-29 DIAGNOSIS — N400 Enlarged prostate without lower urinary tract symptoms: Secondary | ICD-10-CM | POA: Diagnosis not present

## 2020-02-25 DIAGNOSIS — H40023 Open angle with borderline findings, high risk, bilateral: Secondary | ICD-10-CM | POA: Diagnosis not present

## 2020-02-25 DIAGNOSIS — Z961 Presence of intraocular lens: Secondary | ICD-10-CM | POA: Diagnosis not present

## 2020-02-25 DIAGNOSIS — E119 Type 2 diabetes mellitus without complications: Secondary | ICD-10-CM | POA: Diagnosis not present

## 2020-02-25 DIAGNOSIS — H43812 Vitreous degeneration, left eye: Secondary | ICD-10-CM | POA: Diagnosis not present

## 2020-02-25 DIAGNOSIS — Z7984 Long term (current) use of oral hypoglycemic drugs: Secondary | ICD-10-CM | POA: Diagnosis not present

## 2020-03-03 DIAGNOSIS — N1831 Chronic kidney disease, stage 3a: Secondary | ICD-10-CM | POA: Diagnosis not present

## 2020-03-03 DIAGNOSIS — E782 Mixed hyperlipidemia: Secondary | ICD-10-CM | POA: Diagnosis not present

## 2020-03-03 DIAGNOSIS — M189 Osteoarthritis of first carpometacarpal joint, unspecified: Secondary | ICD-10-CM | POA: Diagnosis not present

## 2020-03-03 DIAGNOSIS — N400 Enlarged prostate without lower urinary tract symptoms: Secondary | ICD-10-CM | POA: Diagnosis not present

## 2020-03-03 DIAGNOSIS — I129 Hypertensive chronic kidney disease with stage 1 through stage 4 chronic kidney disease, or unspecified chronic kidney disease: Secondary | ICD-10-CM | POA: Diagnosis not present

## 2020-03-03 DIAGNOSIS — E1129 Type 2 diabetes mellitus with other diabetic kidney complication: Secondary | ICD-10-CM | POA: Diagnosis not present

## 2020-04-13 DIAGNOSIS — N400 Enlarged prostate without lower urinary tract symptoms: Secondary | ICD-10-CM | POA: Diagnosis not present

## 2020-04-13 DIAGNOSIS — E782 Mixed hyperlipidemia: Secondary | ICD-10-CM | POA: Diagnosis not present

## 2020-04-13 DIAGNOSIS — I4819 Other persistent atrial fibrillation: Secondary | ICD-10-CM | POA: Diagnosis not present

## 2020-04-13 DIAGNOSIS — I129 Hypertensive chronic kidney disease with stage 1 through stage 4 chronic kidney disease, or unspecified chronic kidney disease: Secondary | ICD-10-CM | POA: Diagnosis not present

## 2020-04-13 DIAGNOSIS — E1129 Type 2 diabetes mellitus with other diabetic kidney complication: Secondary | ICD-10-CM | POA: Diagnosis not present

## 2020-04-13 DIAGNOSIS — Z1389 Encounter for screening for other disorder: Secondary | ICD-10-CM | POA: Diagnosis not present

## 2020-04-13 DIAGNOSIS — Z0000 Encounter for general adult medical examination without abnormal findings: Secondary | ICD-10-CM | POA: Diagnosis not present

## 2020-04-13 DIAGNOSIS — N1831 Chronic kidney disease, stage 3a: Secondary | ICD-10-CM | POA: Diagnosis not present

## 2020-04-13 DIAGNOSIS — D6869 Other thrombophilia: Secondary | ICD-10-CM | POA: Diagnosis not present

## 2020-06-03 ENCOUNTER — Ambulatory Visit: Payer: Medicare HMO | Admitting: Cardiology

## 2020-06-22 ENCOUNTER — Ambulatory Visit: Payer: Medicare HMO | Admitting: Cardiology

## 2020-06-22 ENCOUNTER — Other Ambulatory Visit: Payer: Self-pay

## 2020-06-22 ENCOUNTER — Encounter: Payer: Self-pay | Admitting: Cardiology

## 2020-06-22 VITALS — BP 124/78 | HR 70 | Ht 69.0 in | Wt 187.6 lb

## 2020-06-22 DIAGNOSIS — I4821 Permanent atrial fibrillation: Secondary | ICD-10-CM

## 2020-06-22 DIAGNOSIS — E088 Diabetes 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: Secondary | ICD-10-CM | POA: Diagnosis not present

## 2020-06-22 DIAGNOSIS — E782 Mixed hyperlipidemia: Secondary | ICD-10-CM | POA: Diagnosis not present

## 2020-06-22 NOTE — Progress Notes (Signed)
Cardiology Office Note:    Date:  06/22/2020   ID:  Michael Mejia, DOB August 24, 1940, MRN [id number]  PCP:  Maury Dus, MD  Cardiologist:  Jenean Lindau, MD   Referring MD: Maury Dus, MD    ASSESSMENT:    1. Permanent atrial fibrillation ([place])   2. Mixed dyslipidemia   3. Diabetes 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 ([place])    PLAN:    In order of problems listed above:  1. Primary prevention stressed with the patient.  Importance of compliance with diet medication stressed and he vocalized understanding. 2. Permanent atrial fibrillation:I discussed with the patient atrial fibrillation, disease process. Management and therapy including rate and rhythm control, anticoagulation benefits and potential risks were discussed extensively with the patient. Patient had multiple questions which were answered to patient's satisfaction. 3. Mixed dyslipidemia: Statin therapy is in place and this is followed by primary care physician. 4. Diabetes mellitus: Managed by primary care provider.  Hemoglobin A1c is mildly elevated and I discussed this with him.  He promises to do better.  I told him to walk at least half an hour a day 5 days a week and he is going to do this meticulously. 5. Patient will be seen in follow-up appointment in 6 months or earlier if the patient has any concerns    Medication Adjustments/Labs and Tests Ordered: Current medicines are reviewed at length with the patient today.  Concerns regarding medicines are outlined above.  No orders of the defined types were placed in this encounter.  No orders of the defined types were placed in this encounter.    No chief complaint on file.    History of Present Illness:    Michael Mejia is a 80 y.o. male.  Patient has past medical history of permanent atrial fibrillation, diabetes mellitus and mixed dyslipidemia.  he denies any problems at this time and takes care of activities of daily living.   No chest pain orthopnea or PND.  At the time of my evaluation, the patient is alert awake oriented and in no distress.  He works all day long in the yard and do such work but does not exercise on a regular basis.  Past Medical History:  Diagnosis Date  . 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 Afib ([place])   . Allergic rhinitis 07/24/2013  . 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 Allergic rhinitis due to pollen 07/24/2013  . Arthritis   . Complaining of cough 07/24/2013  . Deformity of metatarsal bone of left foot 11/08/2013  . Diabetes 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 ([place]) 7/79/3903  . Hypercholesteremia   . Lap chole Jan 2015 02/17/2013  . Mixed dyslipidemia 12/08/2017  . Perforated viscus 10/23/2011   Perforated jejunal diverticulum   . Permanent atrial fibrillation ([place]) 12/08/2017  . Plantar fasciitis of left foot 09/03/2013    Past Surgical History:  Procedure Laterality Date  . BOWEL RESECTION  10/24/2011   Procedure: SMALL BOWEL RESECTION;  Surgeon: Pedro Earls, MD;  [facility]: [facility];  Service: General;;  for small bowel perforation   . CHOLECYSTECTOMY N/A 02/17/2013   Procedure: LAPAROSCOPIC CHOLECYSTECTOMY WITH INTRAOPERATIVE CHOLANGIOGRAM;  Surgeon: Pedro Earls, MD;  [facility];  Service: General;  Laterality: N/A;  . ERCP N/A 02/15/2013   Procedure: ENDOSCOPIC RETROGRADE CHOLANGIOPANCREATOGRAPHY (ERCP);  Surgeon: Jeryl Columbia, MD;  Location: [facility];  Service: Endoscopy;  Laterality: N/A;  . LAPAROTOMY  10/24/2011   Procedure: EXPLORATORY LAPAROTOMY;  Surgeon: Pedro Earls, MD;  Location: [facility];  Service: General;  Laterality: N/A;  . ROTATOR CUFF REPAIR      Current Medications: Current Meds  Medication Sig  . losartan (COZAAR) 50 MG tablet Take 50 mg by mouth daily.  . metFORMIN (GLUCOPHAGE) 500 MG tablet Take 500 mg by mouth 2 (two) times daily with a meal.   . Multiple Vitamin (MULTIVITAMIN) tablet  Take 1 tablet by mouth daily.  . rivaroxaban (XARELTO) 20 MG TABS tablet Take 20 mg by mouth daily with supper.  . simvastatin (ZOCOR) 40 MG tablet Take 1 tablet (40 mg total) by mouth every evening.  . Tamsulosin HCl (FLOMAX) 0.4 MG CAPS Take 0.4 mg by mouth every evening.  . vitamin C (ASCORBIC ACID) 500 MG tablet Take 500 mg by mouth daily.     Allergies:   Codeine, Guaifenesin, Lactose, and Lactose intolerance (gi)   Social History   Socioeconomic History  . Marital status: Married    Spouse name: Not on file  . Number of children: Not on file  . Years of education: Not on file  . Highest education level: Not on file  Occupational History  . Not on file  Tobacco Use  . Smoking status: Former Research scientist (life sciences)  . Smokeless tobacco: Never Used  Substance and Sexual Activity  . Alcohol use: No  . Drug use: No  . Sexual activity: Never  Other Topics Concern  . Not on file  Social History Narrative  . Not on file   Social Determinants of Health   Financial Resource Strain: Not on file  Food Insecurity: Not on file  Transportation Needs: Not on file  Physical Activity: Not on file  Stress: Not on file  Social Connections: Not on file     Family History: The patient's family history includes Pancreatic cancer in his brother and mother.  ROS:   Please see the history of present illness.    All other systems reviewed and are negative.  EKGs/Labs/Other Studies Reviewed:    The following studies were reviewed today: I discussed my findings with the patient at length.   Recent Labs: 12/04/2019: ALT 8; BUN 19; Creatinine, Ser 1.32; Hemoglobin 14.8; Platelets 215; Potassium 4.8; Sodium 138; TSH 2.750  Recent Lipid Panel    Component Value Date/Time   CHOL 158 12/04/2019 0943   TRIG 123 12/04/2019 0943   HDL 41 12/04/2019 0943   CHOLHDL 3.9 12/04/2019 0943   LDLCALC 95 12/04/2019 0943    Physical Exam:    VS:  BP 124/78   Pulse 70   Ht 5\' 9"  (1.753 m)   Wt 187 lb 9.6  oz (85.1 kg)   SpO2 96%   BMI 27.70 kg/m     Wt Readings from Last 3 Encounters:  06/22/20 187 lb 9.6 oz (85.1 kg)  12/04/19 184 lb 9.6 oz (83.7 kg)  05/28/19 192 lb 3.2 oz (87.2 kg)     GEN: Patient is in no acute distress HEENT: Normal NECK: No JVD; No carotid bruits LYMPHATICS: No lymphadenopathy CARDIAC: Hear sounds regular, 2/6 systolic murmur at the apex. RESPIRATORY:  Clear to auscultation without rales, wheezing or rhonchi  ABDOMEN: Soft, non-tender, non-distended MUSCULOSKELETAL:  No edema; No deformity  SKIN: Warm and dry NEUROLOGIC:  Alert and oriented x 3 PSYCHIATRIC:  Normal affect   Signed, Jenean Lindau, MD  06/22/2020 10:00 AM    [facility]

## 2020-06-22 NOTE — Patient Instructions (Signed)

## 2020-07-16 MED ORDER — RIVAROXABAN 20 MG PO TABS
20.0000 mg | ORAL_TABLET | Freq: Every day | ORAL | 3 refills | Status: DC
Start: 2020-07-16 — End: 2021-07-29

## 2020-07-24 DIAGNOSIS — I129 Hypertensive chronic kidney disease with stage 1 through stage 4 chronic kidney disease, or unspecified chronic kidney disease: Secondary | ICD-10-CM | POA: Diagnosis not present

## 2020-07-24 DIAGNOSIS — E1129 Type 2 diabetes mellitus with other diabetic kidney complication: Secondary | ICD-10-CM | POA: Diagnosis not present

## 2020-07-24 DIAGNOSIS — M189 Osteoarthritis of first carpometacarpal joint, unspecified: Secondary | ICD-10-CM | POA: Diagnosis not present

## 2020-07-24 DIAGNOSIS — N400 Enlarged prostate without lower urinary tract symptoms: Secondary | ICD-10-CM | POA: Diagnosis not present

## 2020-07-24 DIAGNOSIS — E782 Mixed hyperlipidemia: Secondary | ICD-10-CM | POA: Diagnosis not present

## 2020-09-30 DIAGNOSIS — N400 Enlarged prostate without lower urinary tract symptoms: Secondary | ICD-10-CM | POA: Diagnosis not present

## 2020-09-30 DIAGNOSIS — N183 Chronic kidney disease, stage 3 unspecified: Secondary | ICD-10-CM | POA: Diagnosis not present

## 2020-09-30 DIAGNOSIS — M189 Osteoarthritis of first carpometacarpal joint, unspecified: Secondary | ICD-10-CM | POA: Diagnosis not present

## 2020-09-30 DIAGNOSIS — I129 Hypertensive chronic kidney disease with stage 1 through stage 4 chronic kidney disease, or unspecified chronic kidney disease: Secondary | ICD-10-CM | POA: Diagnosis not present

## 2020-09-30 DIAGNOSIS — E1129 Type 2 diabetes mellitus with other diabetic kidney complication: Secondary | ICD-10-CM | POA: Diagnosis not present

## 2020-09-30 DIAGNOSIS — N1831 Chronic kidney disease, stage 3a: Secondary | ICD-10-CM | POA: Diagnosis not present

## 2020-09-30 DIAGNOSIS — E782 Mixed hyperlipidemia: Secondary | ICD-10-CM | POA: Diagnosis not present

## 2020-10-26 DIAGNOSIS — U071 COVID-19: Secondary | ICD-10-CM | POA: Diagnosis not present

## 2020-11-04 DIAGNOSIS — N400 Enlarged prostate without lower urinary tract symptoms: Secondary | ICD-10-CM | POA: Diagnosis not present

## 2020-11-04 DIAGNOSIS — E782 Mixed hyperlipidemia: Secondary | ICD-10-CM | POA: Diagnosis not present

## 2020-11-04 DIAGNOSIS — E1129 Type 2 diabetes mellitus with other diabetic kidney complication: Secondary | ICD-10-CM | POA: Diagnosis not present

## 2020-11-04 DIAGNOSIS — I129 Hypertensive chronic kidney disease with stage 1 through stage 4 chronic kidney disease, or unspecified chronic kidney disease: Secondary | ICD-10-CM | POA: Diagnosis not present

## 2020-11-04 DIAGNOSIS — M189 Osteoarthritis of first carpometacarpal joint, unspecified: Secondary | ICD-10-CM | POA: Diagnosis not present

## 2020-11-04 DIAGNOSIS — N1831 Chronic kidney disease, stage 3a: Secondary | ICD-10-CM | POA: Diagnosis not present

## 2020-12-29 DIAGNOSIS — M2560 Stiffness of unspecified joint, not elsewhere classified: Secondary | ICD-10-CM

## 2020-12-29 DIAGNOSIS — E1121 Type 2 diabetes mellitus with diabetic nephropathy: Secondary | ICD-10-CM | POA: Insufficient documentation

## 2020-12-29 DIAGNOSIS — R1013 Epigastric pain: Secondary | ICD-10-CM

## 2020-12-29 DIAGNOSIS — N1831 Chronic kidney disease, stage 3a: Secondary | ICD-10-CM

## 2020-12-29 DIAGNOSIS — D485 Neoplasm of uncertain behavior of skin: Secondary | ICD-10-CM

## 2020-12-29 DIAGNOSIS — I4819 Other persistent atrial fibrillation: Secondary | ICD-10-CM

## 2020-12-29 DIAGNOSIS — E8881 Metabolic syndrome: Secondary | ICD-10-CM

## 2020-12-29 DIAGNOSIS — H9311 Tinnitus, right ear: Secondary | ICD-10-CM

## 2020-12-29 DIAGNOSIS — M189 Osteoarthritis of first carpometacarpal joint, unspecified: Secondary | ICD-10-CM

## 2020-12-29 DIAGNOSIS — E782 Mixed hyperlipidemia: Secondary | ICD-10-CM

## 2020-12-29 DIAGNOSIS — I129 Hypertensive chronic kidney disease with stage 1 through stage 4 chronic kidney disease, or unspecified chronic kidney disease: Secondary | ICD-10-CM | POA: Insufficient documentation

## 2020-12-29 DIAGNOSIS — D6869 Other thrombophilia: Secondary | ICD-10-CM

## 2020-12-29 DIAGNOSIS — G44209 Tension-type headache, unspecified, not intractable: Secondary | ICD-10-CM

## 2020-12-29 DIAGNOSIS — M6530 Trigger finger, unspecified finger: Secondary | ICD-10-CM

## 2020-12-29 DIAGNOSIS — Z6830 Body mass index (BMI) 30.0-30.9, adult: Secondary | ICD-10-CM

## 2020-12-29 DIAGNOSIS — D692 Other nonthrombocytopenic purpura: Secondary | ICD-10-CM

## 2020-12-29 DIAGNOSIS — N400 Enlarged prostate without lower urinary tract symptoms: Secondary | ICD-10-CM

## 2020-12-29 DIAGNOSIS — K760 Fatty (change of) liver, not elsewhere classified: Secondary | ICD-10-CM

## 2020-12-29 HISTORY — DX: Osteoarthritis of first carpometacarpal joint, unspecified: M18.9

## 2020-12-29 HISTORY — DX: Other thrombophilia: D68.69

## 2020-12-29 HISTORY — DX: Hypertensive chronic kidney disease with stage 1 through stage 4 chronic kidney disease, or unspecified chronic kidney disease: I12.9

## 2020-12-29 HISTORY — DX: Metabolic syndrome: E88.810

## 2020-12-29 HISTORY — DX: Stiffness of unspecified joint, not elsewhere classified: M25.60

## 2020-12-29 HISTORY — DX: Type 2 diabetes mellitus with diabetic nephropathy: E11.21

## 2020-12-29 HISTORY — DX: Tension-type headache, unspecified, not intractable: G44.209

## 2020-12-29 HISTORY — DX: Epigastric pain: R10.13

## 2020-12-29 HISTORY — DX: Other persistent atrial fibrillation: I48.19

## 2020-12-29 HISTORY — DX: Chronic kidney disease, stage 3a: N18.31

## 2020-12-29 HISTORY — DX: Neoplasm of uncertain behavior of skin: D48.5

## 2020-12-29 HISTORY — DX: Benign prostatic hyperplasia without lower urinary tract symptoms: N40.0

## 2020-12-29 HISTORY — DX: Other nonthrombocytopenic purpura: D69.2

## 2020-12-29 HISTORY — DX: Mixed hyperlipidemia: E78.2

## 2020-12-29 HISTORY — DX: Metabolic syndrome: E88.81

## 2020-12-29 HISTORY — DX: Body mass index (BMI) 30.0-30.9, adult: Z68.30

## 2020-12-29 HISTORY — DX: Trigger finger, unspecified finger: M65.30

## 2020-12-29 HISTORY — DX: Tinnitus, right ear: H93.11

## 2020-12-29 HISTORY — DX: Fatty (change of) liver, not elsewhere classified: K76.0

## 2021-01-04 ENCOUNTER — Ambulatory Visit: Payer: Medicare HMO | Admitting: Cardiology

## 2021-01-04 ENCOUNTER — Other Ambulatory Visit: Payer: Self-pay

## 2021-01-04 ENCOUNTER — Encounter: Payer: Self-pay | Admitting: Cardiology

## 2021-01-04 VITALS — BP 122/70 | HR 80 | Ht 68.0 in | Wt 177.2 lb

## 2021-01-04 DIAGNOSIS — E782 Mixed hyperlipidemia: Secondary | ICD-10-CM | POA: Diagnosis not present

## 2021-01-04 DIAGNOSIS — I4821 Permanent atrial fibrillation: Secondary | ICD-10-CM

## 2021-01-04 DIAGNOSIS — E088 Diabetes 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: Secondary | ICD-10-CM | POA: Diagnosis not present

## 2021-01-04 NOTE — Patient Instructions (Signed)

## 2021-01-04 NOTE — Progress Notes (Signed)
Cardiology Office Note:    Date:  01/04/2021   ID:  Michael Mejia, DOB 09/06/1940, MRN [id number]  PCP:  Maury Dus, MD  Cardiologist:  Jenean Lindau, MD   Referring MD: Maury Dus, MD    ASSESSMENT:    1. Permanent atrial fibrillation ([place])   2. Mixed hyperlipidemia   3. Diabetes 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 ([place])    PLAN:    In order of problems listed above:  Primary prevention stressed with the patient.  Importance of compliance with diet medication stressed and vocalized understanding. Permanent atrial fibrillation:I discussed with the patient atrial fibrillation, disease process. Management and therapy including rate and rhythm control, anticoagulation benefits and potential risks were discussed extensively with the patient. Patient had multiple questions which were answered to patient's satisfaction. Mixed dyslipidemia: Diet was emphasized lifestyle modification urged lipids evaluated and discussed with patient. Diabetes mellitus: He is doing very well with diet.  He is very compliant with medical advice and followed by primary care at this point.  I reemphasized this. Patient will be seen in follow-up appointment in 9 months or earlier if the patient has any concerns    Medication Adjustments/Labs and Tests Ordered: Current medicines are reviewed at length with the patient today.  Concerns regarding medicines are outlined above.  Orders Placed This Encounter  Procedures   EKG 12-Lead    No orders of the defined types were placed in this encounter.    No chief complaint on file.    History of Present Illness:    Michael Mejia is a 80 y.o. male.  Patient has history of permanent atrial fibrillation, mixed dyslipidemia and diabetes mellitus.  He denies any problems at this time and takes care of activities of daily living.  No chest pain orthopnea or PND.  He mentions to me that he is doing very well with diet and  exercise.  He is taking anticoagulation regularly.  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[place]) 12/29/2020   Complaining of cough 07/24/2013   Deformity of metatarsal bone of left foot 11/08/2013   Diabetes 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 ([place]) 9/32/6712   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   Epigastric pain 12/29/2020   Fatty liver 12/29/2020   Hypercholesteremia    Lap chole Jan 2015 02/17/2013   Limitation of joint movement 45/80/9983   Metabolic syndrome 38/25/0539   Mixed dyslipidemia 12/08/2017   Mixed hyperlipidemia 12/29/2020   Neoplasm of uncertain behavior of skin 12/29/2020   Osteoarthritis of first carpometacarpal joint 12/29/2020   Perforated viscus 10/23/2011   Perforated jejunal diverticulum    Permanent atrial fibrillation ([place]) 12/08/2017   Persistent atrial fibrillation ([place]) 12/29/2020   Plantar fasciitis of left foot 09/03/2013   Senile purpura ([place]) 12/29/2020   Tension-type headache, unspecified, not intractable 12/29/2020   Tinnitus of right ear 12/29/2020    Past Surgical History:  Procedure Laterality Date   BOWEL RESECTION  10/24/2011   Procedure: SMALL BOWEL RESECTION;  Surgeon:  Pedro Earls, MD;  [facility];  Service: General;;  for small bowel perforation    CHOLECYSTECTOMY N/A 02/17/2013   Procedure: LAPAROSCOPIC CHOLECYSTECTOMY WITH INTRAOPERATIVE CHOLANGIOGRAM;  Surgeon: Pedro Earls, MD;  [facility];  Service: General;  Laterality: N/A;    ERCP N/A 02/15/2013   Procedure: ENDOSCOPIC RETROGRADE CHOLANGIOPANCREATOGRAPHY (ERCP);  Surgeon: Jeryl Columbia, MD;  Location: [facility];  Service: Endoscopy;  Laterality: N/A;   LAPAROTOMY  10/24/2011   Procedure: EXPLORATORY LAPAROTOMY;  Surgeon: Pedro Earls, MD;  [facility];  Service: General;  Laterality: N/A;   ROTATOR CUFF REPAIR      Current Medications: Current Meds  Medication Sig   B Complex-C-Folic Acid (SM B SUPER VITAMIN COMPLEX PO) Take 1 capsule by mouth daily.   losartan (COZAAR) 50 MG tablet Take 50 mg by mouth daily.   metFORMIN (GLUCOPHAGE) 500 MG tablet Take 500 mg by mouth 2 (two) times daily with a meal.    rivaroxaban (XARELTO) 20 MG TABS tablet Take 1 tablet (20 mg total) by mouth daily with supper.   simvastatin (ZOCOR) 40 MG tablet Take 1 tablet (40 mg total) by mouth every evening.   Tamsulosin HCl (FLOMAX) 0.4 MG CAPS Take 0.4 mg by mouth every evening.   vitamin C (ASCORBIC ACID) 500 MG tablet Take 500 mg by mouth daily.     Allergies:   Codeine, Guaifenesin, Lactose, and Lactose intolerance (gi)   Social History   Socioeconomic History   Marital status: Married    Spouse name: Not on file   Number of children: Not on file   Years of education: Not on file   Highest education level: Not on file  Occupational History   Not on file  Tobacco Use   Smoking status: Former   Smokeless tobacco: Never  Substance and Sexual Activity   Alcohol use: No   Drug use: No   Sexual activity: Never  Other Topics Concern   Not on file  Social History Narrative   Not on file   Social Determinants of Health   Financial Resource Strain: Not on file  Food Insecurity: Not on file  Transportation Needs: Not on file  Physical Activity: Not on file  Stress: Not on file  Social Connections: Not on file     Family History: The patient's family history includes Pancreatic cancer in his brother and mother.  ROS:   Please see the history of present  illness.    All other systems reviewed and are negative.  EKGs/Labs/Other Studies Reviewed:    The following studies were reviewed today: EKG reveals atrial fibrillation with well-controlled ventricular rate.   Recent Labs: No results found for requested labs within last 8760 hours.  Recent Lipid Panel    Component Value Date/Time   CHOL 158 12/04/2019 0943   TRIG 123 12/04/2019 0943   HDL 41 12/04/2019 0943   CHOLHDL 3.9 12/04/2019 0943   LDLCALC 95 12/04/2019 0943    Physical Exam:    VS:  BP 122/70   Pulse 80   Ht 5\' 8"  (1.727 m)   Wt 177 lb 3.2 oz (80.4 kg)   SpO2 97%   BMI 26.94 kg/m     Wt Readings from Last 3 Encounters:  01/04/21 177 lb 3.2 oz (80.4 kg)  06/22/20 187 lb 9.6 oz (85.1 kg)  12/04/19 184 lb 9.6 oz (83.7 kg)     GEN: Patient is in no acute distress HEENT: Normal NECK: No  JVD; No carotid bruits LYMPHATICS: No lymphadenopathy CARDIAC: Hear sounds regular, 2/6 systolic murmur at the apex. RESPIRATORY:  Clear to auscultation without rales, wheezing or rhonchi  ABDOMEN: Soft, non-tender, non-distended MUSCULOSKELETAL:  No edema; No deformity  SKIN: Warm and dry NEUROLOGIC:  Alert and oriented x 3 PSYCHIATRIC:  Normal affect   Signed, Jenean Lindau, MD  01/04/2021 10:11 AM    [place]

## 2021-01-07 DIAGNOSIS — L03313 Cellulitis of chest wall: Secondary | ICD-10-CM | POA: Diagnosis not present

## 2021-01-30 DIAGNOSIS — J069 Acute upper respiratory infection, unspecified: Secondary | ICD-10-CM | POA: Diagnosis not present

## 2021-01-30 DIAGNOSIS — R051 Acute cough: Secondary | ICD-10-CM | POA: Diagnosis not present

## 2021-01-30 DIAGNOSIS — E119 Type 2 diabetes mellitus without complications: Secondary | ICD-10-CM | POA: Diagnosis not present

## 2021-01-30 DIAGNOSIS — J189 Pneumonia, unspecified organism: Secondary | ICD-10-CM | POA: Diagnosis not present

## 2021-01-30 DIAGNOSIS — Z20828 Contact with and (suspected) exposure to other viral communicable diseases: Secondary | ICD-10-CM | POA: Diagnosis not present

## 2021-02-18 DIAGNOSIS — N400 Enlarged prostate without lower urinary tract symptoms: Secondary | ICD-10-CM | POA: Diagnosis not present

## 2021-02-18 DIAGNOSIS — D6869 Other thrombophilia: Secondary | ICD-10-CM | POA: Diagnosis not present

## 2021-02-18 DIAGNOSIS — M189 Osteoarthritis of first carpometacarpal joint, unspecified: Secondary | ICD-10-CM | POA: Diagnosis not present

## 2021-02-18 DIAGNOSIS — I4819 Other persistent atrial fibrillation: Secondary | ICD-10-CM | POA: Diagnosis not present

## 2021-02-18 DIAGNOSIS — I129 Hypertensive chronic kidney disease with stage 1 through stage 4 chronic kidney disease, or unspecified chronic kidney disease: Secondary | ICD-10-CM | POA: Diagnosis not present

## 2021-02-18 DIAGNOSIS — E1129 Type 2 diabetes mellitus with other diabetic kidney complication: Secondary | ICD-10-CM | POA: Diagnosis not present

## 2021-02-18 DIAGNOSIS — N1831 Chronic kidney disease, stage 3a: Secondary | ICD-10-CM | POA: Diagnosis not present

## 2021-02-18 DIAGNOSIS — Z0000 Encounter for general adult medical examination without abnormal findings: Secondary | ICD-10-CM | POA: Diagnosis not present

## 2021-02-18 DIAGNOSIS — E782 Mixed hyperlipidemia: Secondary | ICD-10-CM | POA: Diagnosis not present

## 2021-04-19 DIAGNOSIS — E782 Mixed hyperlipidemia: Secondary | ICD-10-CM | POA: Diagnosis not present

## 2021-05-03 DIAGNOSIS — L0390 Cellulitis, unspecified: Secondary | ICD-10-CM | POA: Diagnosis not present

## 2021-07-28 ENCOUNTER — Other Ambulatory Visit: Payer: Self-pay | Admitting: Cardiology

## 2021-07-28 DIAGNOSIS — I4819 Other persistent atrial fibrillation: Secondary | ICD-10-CM

## 2021-07-28 DIAGNOSIS — I4821 Permanent atrial fibrillation: Secondary | ICD-10-CM

## 2021-07-29 NOTE — Telephone Encounter (Signed)
Xarelto '20mg'$  refill request received. Pt is 81 years old, weight-80.4kg, Crea-1.12 on 02/18/2021 via [facility] from [place], last seen by Dr. Geraldo Pitter on 01/04/2021, Diagnosis-Afib, CrCl-59.46m/min, spoke with pharmacist,Chris since close to 648mmin continue dose at this time; Dose is appropriate based on dosing criteria. Will send in refill to requested pharmacy.

## 2021-08-18 DIAGNOSIS — N1831 Chronic kidney disease, stage 3a: Secondary | ICD-10-CM | POA: Diagnosis not present

## 2021-08-18 DIAGNOSIS — M189 Osteoarthritis of first carpometacarpal joint, unspecified: Secondary | ICD-10-CM | POA: Diagnosis not present

## 2021-08-18 DIAGNOSIS — I129 Hypertensive chronic kidney disease with stage 1 through stage 4 chronic kidney disease, or unspecified chronic kidney disease: Secondary | ICD-10-CM | POA: Diagnosis not present

## 2021-08-18 DIAGNOSIS — I4819 Other persistent atrial fibrillation: Secondary | ICD-10-CM | POA: Diagnosis not present

## 2021-08-18 DIAGNOSIS — D6869 Other thrombophilia: Secondary | ICD-10-CM | POA: Diagnosis not present

## 2021-08-18 DIAGNOSIS — Z6828 Body mass index (BMI) 28.0-28.9, adult: Secondary | ICD-10-CM | POA: Diagnosis not present

## 2021-08-18 DIAGNOSIS — N400 Enlarged prostate without lower urinary tract symptoms: Secondary | ICD-10-CM | POA: Diagnosis not present

## 2021-08-18 DIAGNOSIS — E782 Mixed hyperlipidemia: Secondary | ICD-10-CM | POA: Diagnosis not present

## 2021-08-18 DIAGNOSIS — E1129 Type 2 diabetes mellitus with other diabetic kidney complication: Secondary | ICD-10-CM | POA: Diagnosis not present

## 2021-08-26 DIAGNOSIS — D692 Other nonthrombocytopenic purpura: Secondary | ICD-10-CM | POA: Diagnosis not present

## 2021-08-26 DIAGNOSIS — L820 Inflamed seborrheic keratosis: Secondary | ICD-10-CM | POA: Diagnosis not present

## 2021-08-26 DIAGNOSIS — L821 Other seborrheic keratosis: Secondary | ICD-10-CM | POA: Diagnosis not present

## 2021-10-25 DIAGNOSIS — Z7984 Long term (current) use of oral hypoglycemic drugs: Secondary | ICD-10-CM | POA: Diagnosis not present

## 2021-10-25 DIAGNOSIS — Z961 Presence of intraocular lens: Secondary | ICD-10-CM | POA: Diagnosis not present

## 2021-10-25 DIAGNOSIS — H40023 Open angle with borderline findings, high risk, bilateral: Secondary | ICD-10-CM | POA: Diagnosis not present

## 2021-10-25 DIAGNOSIS — H43812 Vitreous degeneration, left eye: Secondary | ICD-10-CM | POA: Diagnosis not present

## 2021-10-25 DIAGNOSIS — H52223 Regular astigmatism, bilateral: Secondary | ICD-10-CM | POA: Diagnosis not present

## 2021-10-25 DIAGNOSIS — H524 Presbyopia: Secondary | ICD-10-CM | POA: Diagnosis not present

## 2021-10-25 DIAGNOSIS — E119 Type 2 diabetes mellitus without complications: Secondary | ICD-10-CM | POA: Diagnosis not present

## 2021-11-09 DIAGNOSIS — E7800 Pure hypercholesterolemia, unspecified: Secondary | ICD-10-CM | POA: Diagnosis not present

## 2021-11-24 ENCOUNTER — Telehealth: Payer: Self-pay | Admitting: Cardiology

## 2021-11-24 NOTE — Telephone Encounter (Signed)
Patient wants to know if he will need to come to his upcoming appointment fasting or will he need to have labs drawn prior to his visit.

## 2021-11-25 NOTE — Telephone Encounter (Signed)
Advised if he had recent labs we would not repeat. Pt verbalized understanding and had no additional questions.

## 2022-01-06 ENCOUNTER — Other Ambulatory Visit: Payer: Self-pay

## 2022-01-12 ENCOUNTER — Ambulatory Visit: Payer: Medicare HMO | Attending: Cardiology | Admitting: Cardiology

## 2022-01-12 ENCOUNTER — Encounter: Payer: Self-pay | Admitting: Cardiology

## 2022-01-12 VITALS — BP 136/70 | HR 93 | Ht 68.0 in | Wt 188.6 lb

## 2022-01-12 DIAGNOSIS — I10 Essential (primary) hypertension: Secondary | ICD-10-CM

## 2022-01-12 DIAGNOSIS — E782 Mixed hyperlipidemia: Secondary | ICD-10-CM

## 2022-01-12 DIAGNOSIS — I4819 Other persistent atrial fibrillation: Secondary | ICD-10-CM

## 2022-01-12 DIAGNOSIS — E088 Diabetes 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: Secondary | ICD-10-CM

## 2022-01-12 HISTORY — DX: Essential (primary) hypertension: I10

## 2022-01-12 NOTE — Patient Instructions (Signed)

## 2022-01-12 NOTE — Progress Notes (Signed)
Cardiology Office Note:    Date:  01/12/2022   ID:  Michael Mejia, DOB Jul 08, 1940, MRN [id number]  PCP:  Maury Dus, MD  Cardiologist:  Jenean Lindau, MD   Referring MD: Maury Dus, MD    ASSESSMENT:    1. Persistent atrial fibrillation ([place])   2. Diabetes 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 ([place])   3. Mixed dyslipidemia   4. Essential hypertension    PLAN:    In order of problems listed above:  Primary prevention stressed with the patient.  Importance of compliance with diet and medication stressed any vocalized understanding. Permanent atrial fibrillation:I discussed with the patient atrial fibrillation, disease process. Management and therapy including rate and rhythm control, anticoagulation benefits and potential risks were discussed extensively with the patient. Patient had multiple questions which were answered to patient's satisfaction. Essential hypertension: Blood pressure stable and diet was emphasized. Mixed dyslipidemia: Managed by primary care.  I reviewed lipids and discussed diet with him at length.  Especially because of the diabetes situation. Diabetes: Managed by primary care.  Diet emphasized. Renal insufficiency: Appears to be stable. Patient will be seen in follow-up appointment in 6 months or earlier if the patient has any concerns    Medication Adjustments/Labs and Tests Ordered: Current medicines are reviewed at length with the patient today.  Concerns regarding medicines are outlined above.  No orders of the defined types were placed in this encounter.  No orders of the defined types were placed in this encounter.    No chief complaint on file.    History of Present Illness:    Michael Mejia is a 81 y.o. male.  Patient has past medical history of permanent atrial fibrillation, essential hypertension, dyslipidemia, diabetes mellitus and renal insufficiency.  He denies any problems at this time and takes care  of activities of daily living.  No chest pain orthopnea or PND.  He ambulates age appropriately.  At the time of my evaluation, the patient is alert awake oriented and in no distress.  Past Medical History:  Diagnosis Date   Acquired bilateral pes cavus 09/03/2013   Acquired thrombophilia ([place]) 12/29/2020   Acquired trigger finger 12/29/2020   Afib ([place])    Allergic rhinitis 07/24/2013   Allergic rhinitis due to animal dander 07/24/2013   Allergic rhinitis due to pollen 07/24/2013   Arthritis    Benign prostatic hyperplasia 12/29/2020   Body mass index (BMI) 30.0-30.9, adult 12/29/2020   Chronic kidney disease due to hypertension 12/29/2020   Chronic kidney disease, stage 3a ([place]) 12/29/2020   Complaining of cough 07/24/2013   Deformity of metatarsal bone of left foot 11/08/2013   Diabetes 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 ([place]) 3/42/8768   Diabetic renal disease ([place]) 12/29/2020   Epigastric pain 12/29/2020   Fatty liver 12/29/2020   Hypercholesteremia    Lap chole Jan 2015 02/17/2013   Limitation of joint movement 11/57/2620   Metabolic syndrome 35/59/7416   Mixed dyslipidemia 12/08/2017   Mixed hyperlipidemia 12/29/2020   Neoplasm of uncertain behavior of skin 12/29/2020   Osteoarthritis of first carpometacarpal joint 12/29/2020   Perforated viscus 10/23/2011   Perforated jejunal diverticulum    Permanent atrial fibrillation ([place]) 12/08/2017   Persistent atrial fibrillation ([place]) 12/29/2020   Plantar fasciitis of left foot 09/03/2013   Senile purpura ([place]) 12/29/2020   Tension-type headache, unspecified, not intractable 12/29/2020   Tinnitus of right ear 12/29/2020    Past Surgical History:  Procedure Laterality  Date   BOWEL RESECTION  10/24/2011   Procedure: SMALL BOWEL RESECTION;  Surgeon: Pedro Earls, MD;  Location: [facility];  Service: General;;  for small bowel perforation    CHOLECYSTECTOMY N/A 02/17/2013   Procedure: LAPAROSCOPIC  CHOLECYSTECTOMY WITH INTRAOPERATIVE CHOLANGIOGRAM;  Surgeon: Pedro Earls, MD;  Location: [facility];  Service: General;  Laterality: N/A;   ERCP N/A 02/15/2013   Procedure: ENDOSCOPIC RETROGRADE CHOLANGIOPANCREATOGRAPHY (ERCP);  Surgeon: Jeryl Columbia, MD;  Location: [facility];  Service: Endoscopy;  Laterality: N/A;   LAPAROTOMY  10/24/2011   Procedure: EXPLORATORY LAPAROTOMY;  Surgeon: Pedro Earls, MD;  Location: [facility];  Service: General;  Laterality: N/A;   ROTATOR CUFF REPAIR      Current Medications: Current Meds  Medication Sig   atorvastatin (LIPITOR) 10 MG tablet Take 10 mg by mouth 2 (two) times a week.   B Complex-C-Folic Acid (SM B SUPER VITAMIN COMPLEX PO) Take 1 capsule by mouth daily.   losartan (COZAAR) 50 MG tablet Take 50 mg by mouth daily.   metFORMIN (GLUCOPHAGE) 500 MG tablet Take 500 mg by mouth 2 (two) times daily with a meal.    rivaroxaban (XARELTO) 20 MG TABS tablet TAKE 1 TABLET EVERY DAY WITH SUPPER   simvastatin (ZOCOR) 40 MG tablet Take 1 tablet (40 mg total) by mouth every evening.   Tamsulosin HCl (FLOMAX) 0.4 MG CAPS Take 0.4 mg by mouth every evening.     Allergies:   Codeine, Guaifenesin, and Lactose intolerance (gi)   Social History   Socioeconomic History   Marital status: Married    Spouse name: Not on file   Number of children: Not on file   Years of education: Not on file   Highest education level: Not on file  Occupational History   Not on file  Tobacco Use   Smoking status: Former   Smokeless tobacco: Never  Substance and Sexual Activity   Alcohol use: No   Drug use: No   Sexual activity: Never  Other Topics Concern   Not on file  Social History Narrative   Not on file   Social Determinants of Health   Financial Resource Strain: Not on file  Food Insecurity: Not on file  Transportation Needs: Not on file  Physical Activity: Not on file  Stress: Not on file  Social Connections: Not on file     Family History: The  patient's family history includes Pancreatic cancer in his brother and mother.  ROS:   Please see the history of present illness.    All other systems reviewed and are negative.  EKGs/Labs/Other Studies Reviewed:    The following studies were reviewed today: EKG reveals atrial fibrillation with controlled ventricular rate.   Recent Labs: No results found for requested labs within last 365 days.  Recent Lipid Panel    Component Value Date/Time   CHOL 158 12/04/2019 0943   TRIG 123 12/04/2019 0943   HDL 41 12/04/2019 0943   CHOLHDL 3.9 12/04/2019 0943   LDLCALC 95 12/04/2019 0943    Physical Exam:    VS:  BP 136/70   Pulse 93   Ht '5\' 8"'$  (1.727 m)   Wt 188 lb 9.6 oz (85.5 kg)   SpO2 96%   BMI 28.68 kg/m     Wt Readings from Last 3 Encounters:  01/12/22 188 lb 9.6 oz (85.5 kg)  01/04/21 177 lb 3.2 oz (80.4 kg)  06/22/20 187 lb 9.6 oz (85.1 kg)  GEN: Patient is in no acute distress HEENT: Normal NECK: No JVD; No carotid bruits LYMPHATICS: No lymphadenopathy CARDIAC: Hear sounds irregular, 2/6 systolic murmur at the apex. RESPIRATORY:  Clear to auscultation without rales, wheezing or rhonchi  ABDOMEN: Soft, non-tender, non-distended MUSCULOSKELETAL:  No edema; No deformity  SKIN: Warm and dry NEUROLOGIC:  Alert and oriented x 3 PSYCHIATRIC:  Normal affect   Signed, Jenean Lindau, MD  01/12/2022 4:23 PM    [facility]

## 2022-02-02 DIAGNOSIS — J069 Acute upper respiratory infection, unspecified: Secondary | ICD-10-CM | POA: Diagnosis not present

## 2022-02-10 ENCOUNTER — Other Ambulatory Visit (HOSPITAL_COMMUNITY): Payer: Self-pay

## 2022-02-11 ENCOUNTER — Other Ambulatory Visit: Payer: Self-pay

## 2022-02-11 ENCOUNTER — Telehealth: Payer: Self-pay | Admitting: Cardiology

## 2022-02-11 DIAGNOSIS — I4819 Other persistent atrial fibrillation: Secondary | ICD-10-CM

## 2022-02-11 DIAGNOSIS — I4821 Permanent atrial fibrillation: Secondary | ICD-10-CM

## 2022-02-11 MED ORDER — RIVAROXABAN 20 MG PO TABS
20.0000 mg | ORAL_TABLET | Freq: Every day | ORAL | 1 refills | Status: DC
  Filled 2022-05-16: qty 90, 90d supply, fill #0

## 2022-02-11 NOTE — Telephone Encounter (Signed)
*  STAT* If patient is at the pharmacy, call can be transferred to refill team.   1. Which medications need to be refilled? (please list name of each medication and dose if known)  rivaroxaban (XARELTO) 20 MG TABS tablet   2. Which pharmacy/location (including street and city if local pharmacy) is medication to be sent to? [place], [place] [address]    3. Do they need a 30 day or 90 day supply? 30 day supply

## 2022-02-11 NOTE — Telephone Encounter (Signed)
Prescription refill request for Xarelto received.  Indication:afib Last office visit:12/23 Weight:85.5 kg Age:82 Scr:1.2 CrCl:59.38  ml/min  Prescription refilled

## 2022-03-14 ENCOUNTER — Other Ambulatory Visit: Payer: Self-pay

## 2022-03-14 ENCOUNTER — Other Ambulatory Visit (HOSPITAL_COMMUNITY): Payer: Self-pay

## 2022-03-14 DIAGNOSIS — I129 Hypertensive chronic kidney disease with stage 1 through stage 4 chronic kidney disease, or unspecified chronic kidney disease: Secondary | ICD-10-CM | POA: Diagnosis not present

## 2022-03-14 DIAGNOSIS — N1831 Chronic kidney disease, stage 3a: Secondary | ICD-10-CM | POA: Diagnosis not present

## 2022-03-14 DIAGNOSIS — E782 Mixed hyperlipidemia: Secondary | ICD-10-CM | POA: Diagnosis not present

## 2022-03-14 DIAGNOSIS — I4819 Other persistent atrial fibrillation: Secondary | ICD-10-CM | POA: Diagnosis not present

## 2022-03-14 DIAGNOSIS — R946 Abnormal results of thyroid function studies: Secondary | ICD-10-CM | POA: Diagnosis not present

## 2022-03-14 DIAGNOSIS — Z1159 Encounter for screening for other viral diseases: Secondary | ICD-10-CM | POA: Diagnosis not present

## 2022-03-14 DIAGNOSIS — D6869 Other thrombophilia: Secondary | ICD-10-CM | POA: Diagnosis not present

## 2022-03-14 DIAGNOSIS — E1122 Type 2 diabetes mellitus with diabetic chronic kidney disease: Secondary | ICD-10-CM | POA: Diagnosis not present

## 2022-03-14 DIAGNOSIS — Z0000 Encounter for general adult medical examination without abnormal findings: Secondary | ICD-10-CM | POA: Diagnosis not present

## 2022-03-14 DIAGNOSIS — E1129 Type 2 diabetes mellitus with other diabetic kidney complication: Secondary | ICD-10-CM | POA: Diagnosis not present

## 2022-03-14 DIAGNOSIS — Z1331 Encounter for screening for depression: Secondary | ICD-10-CM | POA: Diagnosis not present

## 2022-03-14 DIAGNOSIS — N400 Enlarged prostate without lower urinary tract symptoms: Secondary | ICD-10-CM | POA: Diagnosis not present

## 2022-03-14 MED ORDER — METFORMIN HCL 500 MG PO TABS
500.0000 mg | ORAL_TABLET | Freq: Two times a day (BID) | ORAL | 1 refills | Status: DC
  Filled 2022-03-14: qty 180, 90d supply, fill #0
  Filled 2022-08-09: qty 180, 90d supply, fill #1

## 2022-03-14 MED ORDER — TAMSULOSIN HCL 0.4 MG PO CAPS
0.4000 mg | ORAL_CAPSULE | Freq: Every evening | ORAL | 1 refills | Status: DC
  Filled 2022-03-14: qty 90, 90d supply, fill #0
  Filled 2022-06-17: qty 90, 90d supply, fill #1

## 2022-04-12 DIAGNOSIS — R946 Abnormal results of thyroid function studies: Secondary | ICD-10-CM | POA: Diagnosis not present

## 2022-05-16 ENCOUNTER — Other Ambulatory Visit: Payer: Self-pay

## 2022-05-16 ENCOUNTER — Other Ambulatory Visit (HOSPITAL_COMMUNITY): Payer: Self-pay

## 2022-05-17 ENCOUNTER — Other Ambulatory Visit: Payer: Self-pay

## 2022-05-18 ENCOUNTER — Other Ambulatory Visit (HOSPITAL_COMMUNITY): Payer: Self-pay

## 2022-05-18 MED ORDER — RIVAROXABAN 20 MG PO TABS
20.0000 mg | ORAL_TABLET | Freq: Every day | ORAL | 5 refills | Status: DC
  Filled 2022-05-18 – 2022-08-09 (×2): qty 30, 30d supply, fill #0
  Filled 2022-09-11: qty 30, 30d supply, fill #1
  Filled 2022-10-10: qty 30, 30d supply, fill #2

## 2022-06-17 ENCOUNTER — Other Ambulatory Visit (HOSPITAL_COMMUNITY): Payer: Self-pay

## 2022-06-23 ENCOUNTER — Ambulatory Visit (INDEPENDENT_AMBULATORY_CARE_PROVIDER_SITE_OTHER): Payer: HMO | Admitting: Podiatry

## 2022-06-23 ENCOUNTER — Other Ambulatory Visit (HOSPITAL_COMMUNITY): Payer: Self-pay

## 2022-06-23 DIAGNOSIS — L603 Nail dystrophy: Secondary | ICD-10-CM | POA: Diagnosis not present

## 2022-06-23 DIAGNOSIS — L600 Ingrowing nail: Secondary | ICD-10-CM

## 2022-06-23 MED ORDER — MUPIROCIN 2 % EX OINT
1.0000 | TOPICAL_OINTMENT | Freq: Two times a day (BID) | CUTANEOUS | 2 refills | Status: DC
  Filled 2022-06-23: qty 44, 22d supply, fill #0

## 2022-06-23 NOTE — Patient Instructions (Signed)

## 2022-06-23 NOTE — Progress Notes (Signed)
Subjective: Chief Complaint  Patient presents with   Nail Problem    Rm 12 Nail thickness that is causing soreness to his nail bed. Left great toe nail had nail trauma from years ago per. Pt. Pt has had nail removed in the past.    83 year old male presents the office for above concerns.  He is having a skin thick and painful again.  I removed previously.  No swelling redness or drainage.  No injuries.  No other concerns.  Objective: AAO x3, NAD DP/PT pulses palpable bilaterally, CRT less than 3 seconds Left hallux nails hypertrophic, dystrophic with brown discoloration of the nails quite dystrophic from previous injury, nail removal slightly.  Incurvation of nail borders.  There is no edema, erythema or signs of infection today.  There is tenderness palpation of the toenail. No pain with calf compression, swelling, warmth, erythema  Assessment: Left hallux onychodystrophy, ingrown toenail  Plan: -All treatment options discussed with the patient including all alternatives, risks, complications.  -We discussed removing the nail again versus treatment options to help treat the dystrophy of the nail. -At this time, the patient is requesting total nail removal with chemical matricectomy to the symptomatic portion of the nail. Risks and complications were discussed with the patient for which they understand and written consent was obtained. Under sterile conditions a total of 3 mL of a mixture of 2% lidocaine plain and 0.5% Marcaine plain was infiltrated in a hallux block fashion. Once anesthetized, the skin was prepped in sterile fashion. A tourniquet was then applied. Next the left hallux nail was removed in total making to remove all nail borders.  Once the nails were ensured to be removed area was debrided and the underlying skin was intact. There is no purulence identified in the procedure. Next phenol was then applied under standard conditions and copiously irrigated.  Silvadene was applied. A  dry sterile dressing was applied. After application of the dressing the tourniquet was removed and there is found to be an immediate capillary refill time to the digit. The patient tolerated the procedure well any complications. Post procedure instructions were discussed the patient for which he verbally understood. Discussed signs/symptoms of infection and directed to call the office immediately should any occur or go directly to the emergency room. In the meantime, encouraged to call the office with any questions, concerns, changes symptoms. -Patient encouraged to call the office with any questions, concerns, change in symptoms.   Vivi Barrack DPM

## 2022-07-07 ENCOUNTER — Ambulatory Visit: Payer: HMO | Admitting: Podiatry

## 2022-07-11 ENCOUNTER — Other Ambulatory Visit (HOSPITAL_COMMUNITY): Payer: Self-pay

## 2022-07-11 ENCOUNTER — Other Ambulatory Visit: Payer: Self-pay

## 2022-07-11 MED ORDER — ATORVASTATIN CALCIUM 10 MG PO TABS
10.0000 mg | ORAL_TABLET | ORAL | 1 refills | Status: DC
  Filled 2022-07-11: qty 28, 98d supply, fill #0
  Filled 2022-10-13: qty 28, 98d supply, fill #1

## 2022-07-11 MED ORDER — LOSARTAN POTASSIUM 25 MG PO TABS
25.0000 mg | ORAL_TABLET | Freq: Every morning | ORAL | 1 refills | Status: DC
  Filled 2022-07-11: qty 100, 100d supply, fill #0
  Filled 2022-11-06: qty 100, 100d supply, fill #1

## 2022-07-12 ENCOUNTER — Other Ambulatory Visit (HOSPITAL_COMMUNITY): Payer: Self-pay

## 2022-08-09 ENCOUNTER — Other Ambulatory Visit (HOSPITAL_COMMUNITY): Payer: Self-pay

## 2022-08-09 ENCOUNTER — Other Ambulatory Visit: Payer: Self-pay

## 2022-08-10 ENCOUNTER — Encounter: Payer: Self-pay | Admitting: Pharmacist

## 2022-08-10 ENCOUNTER — Other Ambulatory Visit: Payer: Self-pay

## 2022-08-12 ENCOUNTER — Other Ambulatory Visit (HOSPITAL_COMMUNITY): Payer: Self-pay

## 2022-08-15 ENCOUNTER — Other Ambulatory Visit (HOSPITAL_COMMUNITY): Payer: Self-pay

## 2022-09-11 ENCOUNTER — Other Ambulatory Visit (HOSPITAL_COMMUNITY): Payer: Self-pay

## 2022-09-12 ENCOUNTER — Other Ambulatory Visit (HOSPITAL_COMMUNITY): Payer: Self-pay

## 2022-09-12 ENCOUNTER — Other Ambulatory Visit: Payer: Self-pay

## 2022-09-12 MED ORDER — TAMSULOSIN HCL 0.4 MG PO CAPS
0.4000 mg | ORAL_CAPSULE | Freq: Every evening | ORAL | 0 refills | Status: DC
  Filled 2022-09-12: qty 90, 90d supply, fill #0

## 2022-09-13 ENCOUNTER — Other Ambulatory Visit: Payer: Self-pay

## 2022-10-11 ENCOUNTER — Other Ambulatory Visit: Payer: Self-pay

## 2022-10-13 ENCOUNTER — Other Ambulatory Visit: Payer: Self-pay

## 2022-11-06 ENCOUNTER — Other Ambulatory Visit: Payer: Self-pay | Admitting: Cardiology

## 2022-11-06 ENCOUNTER — Other Ambulatory Visit (HOSPITAL_COMMUNITY): Payer: Self-pay

## 2022-11-07 ENCOUNTER — Other Ambulatory Visit: Payer: Self-pay

## 2022-11-07 ENCOUNTER — Other Ambulatory Visit (HOSPITAL_COMMUNITY): Payer: Self-pay

## 2022-11-07 MED FILL — Rivaroxaban Tab 20 MG: ORAL | 30 days supply | Qty: 30 | Fill #0 | Status: AC

## 2022-11-07 NOTE — Telephone Encounter (Signed)
Prescription refill request for Xarelto received.  Indication:afib Last office visit:12/23 Weight:85.5  kg Age:82 Scr:1.22  10/23 CrCl:57.43  ml/min  Prescription refilled

## 2022-11-08 ENCOUNTER — Other Ambulatory Visit (HOSPITAL_COMMUNITY): Payer: Self-pay

## 2022-11-08 ENCOUNTER — Other Ambulatory Visit: Payer: Self-pay

## 2022-11-08 MED ORDER — TAMSULOSIN HCL 0.4 MG PO CAPS
0.4000 mg | ORAL_CAPSULE | Freq: Every evening | ORAL | 0 refills | Status: DC
  Filled 2023-03-22: qty 90, 90d supply, fill #0

## 2022-11-08 MED ORDER — METFORMIN HCL 500 MG PO TABS
500.0000 mg | ORAL_TABLET | Freq: Two times a day (BID) | ORAL | 0 refills | Status: DC
  Filled 2022-11-08: qty 180, 90d supply, fill #0

## 2022-11-10 ENCOUNTER — Other Ambulatory Visit: Payer: Self-pay

## 2022-11-18 ENCOUNTER — Ambulatory Visit: Payer: HMO | Attending: Cardiology | Admitting: Cardiology

## 2022-11-18 ENCOUNTER — Ambulatory Visit: Payer: Medicare HMO | Admitting: Cardiology

## 2022-11-18 ENCOUNTER — Encounter: Payer: Self-pay | Admitting: Cardiology

## 2022-11-18 VITALS — BP 134/80 | HR 68 | Ht 68.0 in | Wt 191.8 lb

## 2022-11-18 DIAGNOSIS — E088 Diabetes 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: Secondary | ICD-10-CM

## 2022-11-18 DIAGNOSIS — E7800 Pure hypercholesterolemia, unspecified: Secondary | ICD-10-CM | POA: Diagnosis not present

## 2022-11-18 DIAGNOSIS — E782 Mixed hyperlipidemia: Secondary | ICD-10-CM | POA: Diagnosis not present

## 2022-11-18 DIAGNOSIS — I4821 Permanent atrial fibrillation: Secondary | ICD-10-CM

## 2022-11-18 DIAGNOSIS — I10 Essential (primary) hypertension: Secondary | ICD-10-CM

## 2022-11-18 NOTE — Patient Instructions (Signed)
Medication Instructions:  Your physician recommends that you continue on your current medications as directed. Please refer to the Current Medication list given to you today.  *If you need a refill on your cardiac medications before your next appointment, please call your pharmacy*   Lab Work: None If you have labs (blood work) drawn today and your tests are completely normal, you will receive your results only by: MyChart Message (if you have MyChart) OR A paper copy in the mail If you have any lab test that is abnormal or we need to change your treatment, we will call you to review the results.   Testing/Procedures: None   Follow-Up: At [facility], you and your health needs are our priority.  As part of our continuing mission to provide you with exceptional heart care, we have created designated Provider Care Teams.  These Care Teams include your primary Cardiologist (physician) and Advanced Practice Providers (APPs -  Physician Assistants and Nurse Practitioners) who all work together to provide you with the care you need, when you need it.  We recommend signing up for the patient portal called "MyChart".  Sign up information is provided on this After Visit Summary.  MyChart is used to connect with patients for Virtual Visits (Telemedicine).  Patients are able to view lab/test results, encounter notes, upcoming appointments, etc.  Non-urgent messages can be sent to your provider as well.   To learn more about what you can do with MyChart, go to [email or web address].    Your next appointment:   9 month(s)  Provider:   Rajan Revankar, MD    Other Instructions None  

## 2022-11-18 NOTE — Progress Notes (Signed)
Cardiology Office Note:    Date:  11/18/2022   ID:  Michael Mejia, DOB 03/30/1940, MRN [id number]  PCP:  Michael Else, MD (Inactive)  Cardiologist:  Michael Brothers, MD   Referring MD: No ref. provider found    ASSESSMENT:    1. Essential hypertension   2. Permanent atrial fibrillation ([facility])   3. Diabetes 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 ([facility])   4. Hypercholesteremia   5. Mixed hyperlipidemia    PLAN:    In order of problems listed above:  Primary prevention stressed with the patient.  Importance of compliance with diet medication stressed and patient verbalized standing. Permanent atrial fibrillation:I discussed with the patient atrial fibrillation, disease process. Management and therapy including rate and rhythm control, anticoagulation benefits and potential risks were discussed extensively with the patient. Patient had multiple questions which were answered to patient's satisfaction. Diabetes mellitus: Elevated hemoglobin A1c.  I counseled diet and exercise.  He vocalized understanding.  He is going get blood work in the next few days with primary care. Mixed dyslipidemia: On lipid-lowering medications.  Last LDL was 129.  I told him to get in touch with his primary care after he is gets his blood work.  I told him that his goal LDL was less than 60 and give it to him in writing. Patient will be seen in follow-up appointment in 6 months or earlier if the patient has any concerns.    Medication Adjustments/Labs and Tests Ordered: Current medicines are reviewed at length with the patient today.  Concerns regarding medicines are outlined above.  No orders of the defined types were placed in this encounter.  No orders of the defined types were placed in this encounter.    No chief complaint on file.    History of Present Illness:    Michael Mejia is a 82 y.o. male.  Patient has past medical history of permanent atrial fibrillation,  diabetes mellitus, essential hypertension and mixed dyslipidemia.  He denies any problems at this time and takes care of activities of daily living.  No chest pain orthopnea or PND.  At the time of my evaluation, the patient is alert awake oriented and in no distress.  Overall he leads a sedentary lifestyle.  Past Medical History:  Diagnosis Date   Acquired bilateral pes cavus 09/03/2013   Acquired thrombophilia ([facility]) 12/29/2020   Acquired trigger finger 12/29/2020   Afib ([facility])    Allergic rhinitis 07/24/2013   Allergic rhinitis due to animal dander 07/24/2013   Allergic rhinitis due to pollen 07/24/2013   Arthritis    Benign prostatic hyperplasia 12/29/2020   Body mass index (BMI) 30.0-30.9, adult 12/29/2020   Chronic kidney disease due to hypertension 12/29/2020   Chronic kidney disease, stage 3a ([facility]) 12/29/2020   Complaining of cough 07/24/2013   Deformity of metatarsal bone of left foot 11/08/2013   Diabetes 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 ([facility]) 05/28/2019   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   Epigastric pain 12/29/2020   Essential hypertension 01/12/2022   Fatty liver 12/29/2020   Hypercholesteremia    Lap chole Jan 2015 02/17/2013   Limitation of joint movement 12/29/2020   Metabolic syndrome 12/29/2020   Mixed dyslipidemia 12/08/2017   Mixed hyperlipidemia 12/29/2020   Neoplasm of uncertain behavior of skin 12/29/2020   Osteoarthritis of first carpometacarpal joint 12/29/2020   Perforated viscus 10/23/2011   Perforated jejunal diverticulum    Permanent atrial fibrillation ([facility]) 12/08/2017  Persistent atrial fibrillation ([facility]) 12/29/2020   Plantar fasciitis of left foot 09/03/2013   Senile purpura ([facility]) 12/29/2020   Tension-type headache, unspecified, not intractable 12/29/2020   Tinnitus of right ear 12/29/2020    Past Surgical History:  Procedure Laterality Date   BOWEL RESECTION  10/24/2011   Procedure: SMALL BOWEL  RESECTION;  Surgeon: Valarie Merino, MD;  Location: [facility];  Service: General;;  for small bowel perforation    CHOLECYSTECTOMY N/A 02/17/2013   Procedure: LAPAROSCOPIC CHOLECYSTECTOMY WITH INTRAOPERATIVE CHOLANGIOGRAM;  Surgeon: Valarie Merino, MD;  Location: [facility];  Service: General;  Laterality: N/A;   ERCP N/A 02/15/2013   Procedure: ENDOSCOPIC RETROGRADE CHOLANGIOPANCREATOGRAPHY (ERCP);  Surgeon: Petra Kuba, MD;  Location: [facility];  Service: Endoscopy;  Laterality: N/A;   LAPAROTOMY  10/24/2011   Procedure: EXPLORATORY LAPAROTOMY;  Surgeon: Valarie Merino, MD;  Location: [facility];  Service: General;  Laterality: N/A;   ROTATOR CUFF REPAIR      Current Medications: Current Meds  Medication Sig   atorvastatin (LIPITOR) 10 MG tablet Take 1 tablet (10 mg total) by mouth 2 (two) times a week.   B Complex-C-Folic Acid (SM B SUPER VITAMIN COMPLEX PO) Take 1 capsule by mouth daily.   losartan (COZAAR) 25 MG tablet Take 1 tablet (25 mg total) by mouth every morning.   metFORMIN (GLUCOPHAGE) 500 MG tablet Take 1 tablet (500 mg total) by mouth 2 (two) times daily with a meal.   rivaroxaban (XARELTO) 20 MG TABS tablet Take 1 tablet (20 mg total) by mouth daily with supper.   tamsulosin (FLOMAX) 0.4 MG CAPS capsule Take 1 capsule (0.4 mg total) by mouth every evening, 30 minutes after the same meal each day.     Allergies:   Codeine, Guaifenesin, and Lactose intolerance (gi)   Social History   Socioeconomic History   Marital status: Married    Spouse name: Not on file   Number of children: Not on file   Years of education: Not on file   Highest education level: Not on file  Occupational History   Not on file  Tobacco Use   Smoking status: Former   Smokeless tobacco: Never  Substance and Sexual Activity   Alcohol use: No   Drug use: No   Sexual activity: Never  Other Topics Concern   Not on file  Social History Narrative   Not on file   Social Determinants of Health    Financial Resource Strain: Not on file  Food Insecurity: Not on file  Transportation Needs: Not on file  Physical Activity: Not on file  Stress: Not on file  Social Connections: Not on file     Family History: The patient's family history includes Pancreatic cancer in his brother and mother.  ROS:   Please see the history of present illness.    All other systems reviewed and are negative.  EKGs/Labs/Other Studies Reviewed:    The following studies were reviewed today: I discussed my findings with the patient at length.   Recent Labs: No results found for requested labs within last 365 days.  Recent Lipid Panel    Component Value Date/Time   CHOL 158 12/04/2019 0943   TRIG 123 12/04/2019 0943   HDL 41 12/04/2019 0943   CHOLHDL 3.9 12/04/2019 0943   LDLCALC 95 12/04/2019 0943    Physical Exam:    VS:  BP 134/80   Pulse 68   Ht 5\' 8"  (1.727 m)   Wt 191  lb 12.8 oz (87 kg)   SpO2 95%   BMI 29.16 kg/m     Wt Readings from Last 3 Encounters:  11/18/22 191 lb 12.8 oz (87 kg)  01/12/22 188 lb 9.6 oz (85.5 kg)  01/04/21 177 lb 3.2 oz (80.4 kg)     GEN: Patient is in no acute distress HEENT: Normal NECK: No JVD; No carotid bruits LYMPHATICS: No lymphadenopathy CARDIAC: Hear sounds regular, 2/6 systolic murmur at the apex. RESPIRATORY:  Clear to auscultation without rales, wheezing or rhonchi  ABDOMEN: Soft, non-tender, non-distended MUSCULOSKELETAL:  No edema; No deformity  SKIN: Warm and dry NEUROLOGIC:  Alert and oriented x 3 PSYCHIATRIC:  Normal affect   Signed, Michael Brothers, MD  11/18/2022 12:54 PM    [facility]

## 2022-11-21 ENCOUNTER — Other Ambulatory Visit (HOSPITAL_COMMUNITY): Payer: Self-pay

## 2022-11-21 DIAGNOSIS — D6869 Other thrombophilia: Secondary | ICD-10-CM | POA: Diagnosis not present

## 2022-11-21 DIAGNOSIS — E1122 Type 2 diabetes mellitus with diabetic chronic kidney disease: Secondary | ICD-10-CM | POA: Diagnosis not present

## 2022-11-21 DIAGNOSIS — N1831 Chronic kidney disease, stage 3a: Secondary | ICD-10-CM | POA: Diagnosis not present

## 2022-11-21 DIAGNOSIS — I129 Hypertensive chronic kidney disease with stage 1 through stage 4 chronic kidney disease, or unspecified chronic kidney disease: Secondary | ICD-10-CM | POA: Diagnosis not present

## 2022-11-21 DIAGNOSIS — N400 Enlarged prostate without lower urinary tract symptoms: Secondary | ICD-10-CM | POA: Diagnosis not present

## 2022-11-21 DIAGNOSIS — B356 Tinea cruris: Secondary | ICD-10-CM | POA: Diagnosis not present

## 2022-11-21 DIAGNOSIS — E1129 Type 2 diabetes mellitus with other diabetic kidney complication: Secondary | ICD-10-CM | POA: Diagnosis not present

## 2022-11-21 DIAGNOSIS — I4819 Other persistent atrial fibrillation: Secondary | ICD-10-CM | POA: Diagnosis not present

## 2022-11-21 DIAGNOSIS — E782 Mixed hyperlipidemia: Secondary | ICD-10-CM | POA: Diagnosis not present

## 2022-11-21 DIAGNOSIS — Z23 Encounter for immunization: Secondary | ICD-10-CM | POA: Diagnosis not present

## 2022-11-21 LAB — LAB REPORT - SCANNED
A1c: 6.7
EGFR: 62

## 2022-11-21 MED ORDER — TAMSULOSIN HCL 0.4 MG PO CAPS
0.4000 mg | ORAL_CAPSULE | Freq: Every evening | ORAL | 1 refills | Status: AC
  Filled 2022-12-13: qty 90, 90d supply, fill #0
  Filled 2023-09-15: qty 90, 90d supply, fill #1

## 2022-11-21 MED ORDER — CLOTRIMAZOLE-BETAMETHASONE 1-0.05 % EX CREA
TOPICAL_CREAM | Freq: Two times a day (BID) | CUTANEOUS | 0 refills | Status: DC
  Filled 2022-11-21: qty 45, 14d supply, fill #0

## 2022-11-21 MED ORDER — ATORVASTATIN CALCIUM 10 MG PO TABS
10.0000 mg | ORAL_TABLET | ORAL | 1 refills | Status: DC
  Filled 2023-01-19: qty 26, 91d supply, fill #0
  Filled 2023-04-20: qty 26, 91d supply, fill #1

## 2022-11-21 MED ORDER — LOSARTAN POTASSIUM 25 MG PO TABS
25.0000 mg | ORAL_TABLET | Freq: Every morning | ORAL | 1 refills | Status: DC
  Filled 2023-01-05 – 2023-02-19 (×5): qty 90, 90d supply, fill #0

## 2022-11-21 MED ORDER — METFORMIN HCL 500 MG PO TABS
500.0000 mg | ORAL_TABLET | Freq: Two times a day (BID) | ORAL | 1 refills | Status: DC
  Filled 2023-02-15: qty 180, 90d supply, fill #0
  Filled 2023-05-12: qty 180, 90d supply, fill #1

## 2022-11-22 ENCOUNTER — Other Ambulatory Visit (HOSPITAL_COMMUNITY): Payer: Self-pay

## 2022-11-22 ENCOUNTER — Other Ambulatory Visit: Payer: Self-pay

## 2022-12-07 ENCOUNTER — Other Ambulatory Visit (HOSPITAL_COMMUNITY): Payer: Self-pay

## 2022-12-07 MED FILL — Rivaroxaban Tab 20 MG: ORAL | 30 days supply | Qty: 30 | Fill #1 | Status: AC

## 2022-12-08 ENCOUNTER — Other Ambulatory Visit: Payer: Self-pay

## 2022-12-13 ENCOUNTER — Other Ambulatory Visit (HOSPITAL_COMMUNITY): Payer: Self-pay

## 2022-12-14 ENCOUNTER — Other Ambulatory Visit (HOSPITAL_COMMUNITY): Payer: Self-pay

## 2023-01-05 MED FILL — Rivaroxaban Tab 20 MG: ORAL | 30 days supply | Qty: 30 | Fill #2 | Status: AC

## 2023-01-06 ENCOUNTER — Other Ambulatory Visit (HOSPITAL_COMMUNITY): Payer: Self-pay

## 2023-01-06 ENCOUNTER — Other Ambulatory Visit: Payer: Self-pay

## 2023-01-19 ENCOUNTER — Other Ambulatory Visit (HOSPITAL_COMMUNITY): Payer: Self-pay

## 2023-01-20 ENCOUNTER — Other Ambulatory Visit (HOSPITAL_COMMUNITY): Payer: Self-pay

## 2023-02-10 ENCOUNTER — Other Ambulatory Visit (HOSPITAL_COMMUNITY): Payer: Self-pay

## 2023-02-10 MED FILL — Rivaroxaban Tab 20 MG: ORAL | 30 days supply | Qty: 30 | Fill #3 | Status: AC

## 2023-02-16 ENCOUNTER — Other Ambulatory Visit: Payer: Self-pay

## 2023-02-16 ENCOUNTER — Other Ambulatory Visit (HOSPITAL_COMMUNITY): Payer: Self-pay

## 2023-02-20 ENCOUNTER — Other Ambulatory Visit: Payer: Self-pay

## 2023-03-08 MED FILL — Rivaroxaban Tab 20 MG: ORAL | 30 days supply | Qty: 30 | Fill #4 | Status: AC

## 2023-03-09 ENCOUNTER — Other Ambulatory Visit: Payer: Self-pay

## 2023-03-22 ENCOUNTER — Other Ambulatory Visit (HOSPITAL_COMMUNITY): Payer: Self-pay

## 2023-04-20 MED FILL — Rivaroxaban Tab 20 MG: ORAL | 30 days supply | Qty: 30 | Fill #5 | Status: AC

## 2023-04-21 ENCOUNTER — Other Ambulatory Visit (HOSPITAL_COMMUNITY): Payer: Self-pay

## 2023-05-12 ENCOUNTER — Other Ambulatory Visit (HOSPITAL_COMMUNITY): Payer: Self-pay

## 2023-05-14 ENCOUNTER — Other Ambulatory Visit: Payer: Self-pay | Admitting: Cardiology

## 2023-05-15 ENCOUNTER — Other Ambulatory Visit (HOSPITAL_COMMUNITY): Payer: Self-pay

## 2023-05-15 DIAGNOSIS — Z1331 Encounter for screening for depression: Secondary | ICD-10-CM | POA: Diagnosis not present

## 2023-05-15 DIAGNOSIS — Z0000 Encounter for general adult medical examination without abnormal findings: Secondary | ICD-10-CM | POA: Diagnosis not present

## 2023-05-15 DIAGNOSIS — N400 Enlarged prostate without lower urinary tract symptoms: Secondary | ICD-10-CM | POA: Diagnosis not present

## 2023-05-15 DIAGNOSIS — I4819 Other persistent atrial fibrillation: Secondary | ICD-10-CM | POA: Diagnosis not present

## 2023-05-15 DIAGNOSIS — I129 Hypertensive chronic kidney disease with stage 1 through stage 4 chronic kidney disease, or unspecified chronic kidney disease: Secondary | ICD-10-CM | POA: Diagnosis not present

## 2023-05-15 DIAGNOSIS — E782 Mixed hyperlipidemia: Secondary | ICD-10-CM | POA: Diagnosis not present

## 2023-05-15 DIAGNOSIS — N1831 Chronic kidney disease, stage 3a: Secondary | ICD-10-CM | POA: Diagnosis not present

## 2023-05-15 DIAGNOSIS — E1129 Type 2 diabetes mellitus with other diabetic kidney complication: Secondary | ICD-10-CM | POA: Diagnosis not present

## 2023-05-15 LAB — LAB REPORT - SCANNED
A1c: 6.7
Albumin, Urine POC: 0.92
Creatinine, POC: 42 mg/dL
EGFR: 70
Microalb Creat Ratio: 21.9
TSH: 4.13 (ref 0.41–5.90)

## 2023-05-15 MED ORDER — METFORMIN HCL 500 MG PO TABS
500.0000 mg | ORAL_TABLET | Freq: Two times a day (BID) | ORAL | 1 refills | Status: AC
  Filled 2023-05-15 – 2023-09-19 (×2): qty 180, 90d supply, fill #0
  Filled 2023-12-14 – 2023-12-15 (×2): qty 180, 90d supply, fill #1

## 2023-05-15 MED ORDER — ATORVASTATIN CALCIUM 10 MG PO TABS
10.0000 mg | ORAL_TABLET | ORAL | 1 refills | Status: AC
  Filled 2023-05-15 – 2023-07-16 (×2): qty 26, 91d supply, fill #0
  Filled 2023-10-15: qty 26, 91d supply, fill #1

## 2023-05-15 MED ORDER — LOSARTAN POTASSIUM 25 MG PO TABS
25.0000 mg | ORAL_TABLET | Freq: Every morning | ORAL | 1 refills | Status: DC
  Filled 2023-05-15: qty 90, 90d supply, fill #0
  Filled 2023-08-20: qty 90, 90d supply, fill #1

## 2023-05-15 MED ORDER — RIVAROXABAN 20 MG PO TABS
20.0000 mg | ORAL_TABLET | Freq: Every day | ORAL | 1 refills | Status: DC
  Filled 2023-05-15: qty 90, 90d supply, fill #0
  Filled 2023-08-10: qty 90, 90d supply, fill #1

## 2023-05-15 MED ORDER — TAMSULOSIN HCL 0.4 MG PO CAPS
0.4000 mg | ORAL_CAPSULE | Freq: Every evening | ORAL | 1 refills | Status: AC
  Filled 2023-05-15 – 2023-06-22 (×2): qty 90, 90d supply, fill #0
  Filled 2023-12-14 – 2023-12-15 (×2): qty 90, 90d supply, fill #1

## 2023-05-15 NOTE — Telephone Encounter (Signed)
 Prescription refill request for Xarelto received.  Indication: Afib  Last office visit: 11/18/22 (Revankar)  Weight: 87kg Age: 83 Scr: 1.17 (11/21/22)  CrCl: 59.78ml/min  Appropriate dose. Refill sent.

## 2023-05-16 ENCOUNTER — Other Ambulatory Visit: Payer: Self-pay

## 2023-05-16 ENCOUNTER — Encounter: Payer: Self-pay | Admitting: Pharmacist

## 2023-05-16 ENCOUNTER — Other Ambulatory Visit (HOSPITAL_COMMUNITY): Payer: Self-pay

## 2023-06-22 ENCOUNTER — Other Ambulatory Visit (HOSPITAL_COMMUNITY): Payer: Self-pay

## 2023-06-22 ENCOUNTER — Other Ambulatory Visit: Payer: Self-pay

## 2023-07-17 ENCOUNTER — Other Ambulatory Visit: Payer: Self-pay

## 2023-07-17 ENCOUNTER — Other Ambulatory Visit (HOSPITAL_COMMUNITY): Payer: Self-pay

## 2023-08-12 ENCOUNTER — Other Ambulatory Visit (HOSPITAL_COMMUNITY): Payer: Self-pay

## 2023-08-14 ENCOUNTER — Other Ambulatory Visit: Payer: Self-pay

## 2023-08-14 ENCOUNTER — Other Ambulatory Visit (HOSPITAL_COMMUNITY): Payer: Self-pay

## 2023-08-21 ENCOUNTER — Other Ambulatory Visit (HOSPITAL_COMMUNITY): Payer: Self-pay

## 2023-08-23 ENCOUNTER — Other Ambulatory Visit: Payer: Self-pay

## 2023-08-24 ENCOUNTER — Ambulatory Visit: Attending: Cardiology | Admitting: Cardiology

## 2023-08-24 ENCOUNTER — Encounter: Payer: Self-pay | Admitting: Cardiology

## 2023-08-24 VITALS — BP 122/68 | HR 70 | Ht 69.0 in | Wt 186.8 lb

## 2023-08-24 DIAGNOSIS — E088 Diabetes 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: Secondary | ICD-10-CM | POA: Diagnosis not present

## 2023-08-24 DIAGNOSIS — E782 Mixed hyperlipidemia: Secondary | ICD-10-CM

## 2023-08-24 DIAGNOSIS — I10 Essential (primary) hypertension: Secondary | ICD-10-CM | POA: Diagnosis not present

## 2023-08-24 DIAGNOSIS — I4821 Permanent atrial fibrillation: Secondary | ICD-10-CM | POA: Diagnosis not present

## 2023-08-24 NOTE — Addendum Note (Signed)
 Addended by: ONEITA BERLINER on: 08/24/2023 09:49 AM   Modules accepted: Orders

## 2023-08-24 NOTE — Patient Instructions (Signed)
 Medication Instructions:  Your physician recommends that you continue on your current medications as directed. Please refer to the Current Medication list given to you today.  *If you need a refill on your cardiac medications before your next appointment, please call your pharmacy*   Lab Work: None ordered If you have labs (blood work) drawn today and your tests are completely normal, you will receive your results only by: MyChart Message (if you have MyChart) OR A paper copy in the mail If you have any lab test that is abnormal or we need to change your treatment, we will call you to review the results.  Testing/Procedures: Your physician has requested that you have an echocardiogram. Echocardiography is a painless test that uses sound waves to create images of your heart. It provides your doctor with information about the size and shape of your heart and how well your heart's chambers and valves are working. This procedure takes approximately one hour. There are no restrictions for this procedure. Please do NOT wear cologne, perfume, aftershave, or lotions (deodorant is allowed). Please arrive 15 minutes prior to your appointment time.  Please note: We ask at that you not bring children with you during ultrasound (echo/ vascular) testing. Due to room size and safety concerns, children are not allowed in the ultrasound rooms during exams. Our front office staff cannot provide observation of children in our lobby area while testing is being conducted. An adult accompanying a patient to their appointment will only be allowed in the ultrasound room at the discretion of the ultrasound technician under special circumstances. We apologize for any inconvenience.  Follow-Up: At [facility], you and your health needs are our priority.  As part of our continuing mission to provide you with exceptional heart care, we have created designated Provider Care Teams.  These Care Teams include your primary  Cardiologist (physician) and Advanced Practice Providers (APPs -  Physician Assistants and Nurse Practitioners) who all work together to provide you with the care you need, when you need it.  We recommend signing up for the patient portal called MyChart.  Sign up information is provided on this After Visit Summary.  MyChart is used to connect with patients for Virtual Visits (Telemedicine).  Patients are able to view lab/test results, encounter notes, upcoming appointments, etc.  Non-urgent messages can be sent to your provider as well.   To learn more about what you can do with MyChart, go to [email or web address].    Your next appointment:   9 month(s)  The format for your next appointment:   In Person  Provider:   Jennifer Crape, MD   Other Instructions Echocardiogram An echocardiogram is a test that uses sound waves (ultrasound) to produce images of the heart. Images from an echocardiogram can provide important information about: Heart size and shape. The size and thickness and movement of your heart's walls. Heart muscle function and strength. Heart valve function or if you have stenosis. Stenosis is when the heart valves are too narrow. If blood is flowing backward through the heart valves (regurgitation). A tumor or infectious growth around the heart valves. Areas of heart muscle that are not working well because of poor blood flow or injury from a heart attack. Aneurysm detection. An aneurysm is a weak or damaged part of an artery wall. The wall bulges out from the normal force of blood pumping through the body. Tell a health care provider about: Any allergies you have. All medicines you are taking, including vitamins, herbs,  eye drops, creams, and over-the-counter medicines. Any blood disorders you have. Any surgeries you have had. Any medical conditions you have. Whether you are pregnant or may be pregnant. What are the risks? Generally, this is a safe test. However,  problems may occur, including an allergic reaction to dye (contrast) that may be used during the test. What happens before the test? No specific preparation is needed. You may eat and drink normally. What happens during the test? You will take off your clothes from the waist up and put on a hospital gown. Electrodes or electrocardiogram (ECG)patches may be placed on your chest. The electrodes or patches are then connected to a device that monitors your heart rate and rhythm. You will lie down on a table for an ultrasound exam. A gel will be applied to your chest to help sound waves pass through your skin. A handheld device, called a transducer, will be pressed against your chest and moved over your heart. The transducer produces sound waves that travel to your heart and bounce back (or echo back) to the transducer. These sound waves will be captured in real-time and changed into images of your heart that can be viewed on a video monitor. The images will be recorded on a computer and reviewed by your health care provider. You may be asked to change positions or hold your breath for a short time. This makes it easier to get different views or better views of your heart. In some cases, you may receive contrast through an IV in one of your veins. This can improve the quality of the pictures from your heart. The procedure may vary among health care providers and hospitals.   What can I expect after the test? You may return to your normal, everyday life, including diet, activities, and medicines, unless your health care provider tells you not to do that. Follow these instructions at home: It is up to you to get the results of your test. Ask your health care provider, or the department that is doing the test, when your results will be ready. Keep all follow-up visits. This is important. Summary An echocardiogram is a test that uses sound waves (ultrasound) to produce images of the heart. Images from an  echocardiogram can provide important information about the size and shape of your heart, heart muscle function, heart valve function, and other possible heart problems. You do not need to do anything to prepare before this test. You may eat and drink normally. After the echocardiogram is completed, you may return to your normal, everyday life, unless your health care provider tells you not to do that. This information is not intended to replace advice given to you by your health care provider. Make sure you discuss any questions you have with your health care provider. Document Revised: 09/17/2019 Document Reviewed: 09/17/2019 Elsevier Patient Education  2021 Elsevier Inc.   Important Information About Sugar

## 2023-08-24 NOTE — Progress Notes (Signed)
 Cardiology Office Note:    Date:  08/24/2023   ID:  Michael Mejia, DOB 11/26/1940, MRN [id number]  PCP:  Gib Charleston, MD  Cardiologist:  Jennifer JONELLE Crape, MD   Referring MD: Gib Charleston, MD    ASSESSMENT:    1. Mixed hyperlipidemia   2. Permanent atrial fibrillation (HCC)   3. Essential hypertension   4. Diabetes 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 (HCC)   5. Mixed dyslipidemia    PLAN:    In order of problems listed above:  Primary prevention stressed with the patient.  Importance of compliance with diet medication stressed and patient verbalized standing. Permanent atrial fibrillation:I discussed with the patient atrial fibrillation, disease process. Management and therapy including rate and rhythm control, anticoagulation benefits and potential risks were discussed extensively with the patient. Patient had multiple questions which were answered to patient's satisfaction. Essential hypertension: Blood pressure stable and diet was emphasized Mixed dyslipidemia: On lipid-lowering medications followed by primary care.  Lipids are elevated and discussed this with him. Diabetes mellitus: Managed by primary care.  Diet emphasized.  Goal LDL is less than 60 in this gentleman. Patient will be seen in follow-up appointment in 6 months or earlier if the patient has any concerns.    Medication Adjustments/Labs and Tests Ordered: Current medicines are reviewed at length with the patient today.  Concerns regarding medicines are outlined above.  Orders Placed This Encounter  Procedures   EKG 12-Lead   No orders of the defined types were placed in this encounter.    No chief complaint on file.    History of Present Illness:    Michael Mejia is a 83 y.o. male.  Patient has past medical history of permanent atrial fibrillation, essential hypertension, mixed dyslipidemia and diabetes mellitus.  He denies any problems at this time and takes care of  activities of daily living.  He is recovering from RSV infection and therefore living a sedentary state.  He plans to get back to walking.  At the time of my evaluation, the patient is alert awake oriented and in no distress.  Past Medical History:  Diagnosis Date   Acquired bilateral pes cavus 09/03/2013   Acquired thrombophilia (HCC) 12/29/2020   Acquired trigger finger 12/29/2020   Afib (HCC)    Allergic rhinitis 07/24/2013   Allergic rhinitis due to animal dander 07/24/2013   Allergic rhinitis due to pollen 07/24/2013   Arthritis    Benign prostatic hyperplasia 12/29/2020   Body mass index (BMI) 30.0-30.9, adult 12/29/2020   Chronic kidney disease due to hypertension 12/29/2020   Chronic kidney disease, stage 3a (HCC) 12/29/2020   Complaining of cough 07/24/2013   Deformity of metatarsal bone of left foot 11/08/2013   Diabetes 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 (HCC) 05/28/2019   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   Epigastric pain 12/29/2020   Essential hypertension 01/12/2022   Fatty liver 12/29/2020   Hypercholesteremia    Lap chole Jan 2015 02/17/2013   Limitation of joint movement 12/29/2020   Metabolic syndrome 12/29/2020   Mixed dyslipidemia 12/08/2017   Mixed hyperlipidemia 12/29/2020   Neoplasm of uncertain behavior of skin 12/29/2020   Osteoarthritis of first carpometacarpal joint 12/29/2020   Perforated viscus 10/23/2011   Perforated jejunal diverticulum    Permanent atrial fibrillation (HCC) 12/08/2017   Persistent atrial fibrillation (HCC) 12/29/2020   Plantar fasciitis of left foot 09/03/2013   Senile purpura (HCC) 12/29/2020   Tension-type headache, unspecified, not intractable  12/29/2020   Tinnitus of right ear 12/29/2020    Past Surgical History:  Procedure Laterality Date   BOWEL RESECTION  10/24/2011   Procedure: SMALL BOWEL RESECTION;  Surgeon: Donnice KATHEE Lunger, MD;  Location: [facility];  Service: General;;  for  small bowel perforation    CHOLECYSTECTOMY N/A 02/17/2013   Procedure: LAPAROSCOPIC CHOLECYSTECTOMY WITH INTRAOPERATIVE CHOLANGIOGRAM;  Surgeon: Donnice KATHEE Lunger, MD;  Location: [facility];  Service: General;  Laterality: N/A;   ERCP N/A 02/15/2013   Procedure: ENDOSCOPIC RETROGRADE CHOLANGIOPANCREATOGRAPHY (ERCP);  Surgeon: Oliva FORBES Boots, MD;  Location: [facility];  Service: Endoscopy;  Laterality: N/A;   LAPAROTOMY  10/24/2011   Procedure: EXPLORATORY LAPAROTOMY;  Surgeon: Donnice KATHEE Lunger, MD;  Location: [facility];  Service: General;  Laterality: N/A;   ROTATOR CUFF REPAIR      Current Medications: Current Meds  Medication Sig   atorvastatin  (LIPITOR) 10 MG tablet Take 1 tablet (10 mg total) by mouth 2 (two) times a week.   losartan  (COZAAR ) 25 MG tablet Take 1 tablet (25 mg total) by mouth every morning.   metFORMIN  (GLUCOPHAGE ) 500 MG tablet Take 1 tablet (500 mg total) by mouth 2 (two) times daily with a meal.   rivaroxaban  (XARELTO ) 20 MG TABS tablet Take 1 tablet (20 mg total) by mouth daily with supper.   tamsulosin  (FLOMAX ) 0.4 MG CAPS capsule Take 1 capsule (0.4 mg total) by mouth every evening, 30 minutes after the same meal each day.   tamsulosin  (FLOMAX ) 0.4 MG CAPS capsule Take 1 capsule (0.4 mg total) by mouth every evening 30 minutes after the same meal each day.     Allergies:   Codeine, Guaifenesin, and Lactose intolerance (gi)   Social History   Socioeconomic History   Marital status: Married    Spouse name: Not on file   Number of children: Not on file   Years of education: Not on file   Highest education level: Not on file  Occupational History   Not on file  Tobacco Use   Smoking status: Former   Smokeless tobacco: Never  Substance and Sexual Activity   Alcohol use: No   Drug use: No   Sexual activity: Never  Other Topics Concern   Not on file  Social History Narrative   Not on file   Social Drivers of Health   Financial Resource Strain: Not on file  Food  Insecurity: Not on file  Transportation Needs: Not on file  Physical Activity: Not on file  Stress: Not on file  Social Connections: Not on file     Family History: The patient's family history includes Pancreatic cancer in his brother and mother.  ROS:   Please see the history of present illness.    All other systems reviewed and are negative.  EKGs/Labs/Other Studies Reviewed:    The following studies were reviewed today: I discussed my findings with the patient at length .SABRAEKG Interpretation Date/Time:  Thursday August 24 2023 09:05:17 EDT Ventricular Rate:  70 PR Interval:    QRS Duration:  136 QT Interval:  414 QTC Calculation: 447 R Axis:   125  Text Interpretation: Atrial fibrillation with premature ventricular or aberrantly conducted complexes Right axis deviation Non-specific intra-ventricular conduction block Cannot rule out Anteroseptal infarct , age undetermined Abnormal ECG When compared with ECG of 05-Jul-2014 15:47, PREVIOUS ECG IS PRESENT Confirmed by Edwyna Backers [phone]) on 08/24/2023 9:38:02 AM     Recent Labs: No results found for requested labs within last  365 days.  Recent Lipid Panel    Component Value Date/Time   CHOL 158 12/04/2019 0943   TRIG 123 12/04/2019 0943   HDL 41 12/04/2019 0943   CHOLHDL 3.9 12/04/2019 0943   LDLCALC 95 12/04/2019 0943    Physical Exam:    VS:  BP 122/68   Pulse 70   Ht 5' 9 (1.753 m)   Wt 186 lb 12.8 oz (84.7 kg)   SpO2 96%   BMI 27.59 kg/m     Wt Readings from Last 3 Encounters:  08/24/23 186 lb 12.8 oz (84.7 kg)  11/18/22 191 lb 12.8 oz (87 kg)  01/12/22 188 lb 9.6 oz (85.5 kg)     GEN: Patient is in no acute distress HEENT: Normal NECK: No JVD; No carotid bruits LYMPHATICS: No lymphadenopathy CARDIAC: Hear sounds regular, 2/6 systolic murmur at the apex. RESPIRATORY:  Clear to auscultation without rales, wheezing or rhonchi  ABDOMEN: Soft, non-tender, non-distended MUSCULOSKELETAL:  No edema; No  deformity  SKIN: Warm and dry NEUROLOGIC:  Alert and oriented x 3 PSYCHIATRIC:  Normal affect   Signed, Jennifer JONELLE Crape, MD  08/24/2023 9:44 AM    [facility]

## 2023-09-13 ENCOUNTER — Other Ambulatory Visit

## 2023-09-15 ENCOUNTER — Other Ambulatory Visit: Payer: Self-pay

## 2023-09-20 ENCOUNTER — Other Ambulatory Visit (HOSPITAL_COMMUNITY): Payer: Self-pay

## 2023-09-20 ENCOUNTER — Other Ambulatory Visit: Payer: Self-pay

## 2023-09-26 ENCOUNTER — Ambulatory Visit: Attending: Cardiology

## 2023-09-26 DIAGNOSIS — I4821 Permanent atrial fibrillation: Secondary | ICD-10-CM | POA: Diagnosis not present

## 2023-09-26 LAB — ECHOCARDIOGRAM COMPLETE
AR max vel: 1.37 cm2
AV Area VTI: 1.53 cm2
AV Area mean vel: 1.33 cm2
AV Mean grad: 4.5 mmHg
AV Peak grad: 8.4 mmHg
Ao pk vel: 1.45 m/s
Area-P 1/2: 3.06 cm2
MV VTI: 0.99 cm2
S' Lateral: 2.7 cm

## 2023-09-27 ENCOUNTER — Ambulatory Visit: Payer: Self-pay | Admitting: Cardiology

## 2023-10-16 ENCOUNTER — Other Ambulatory Visit: Payer: Self-pay

## 2023-11-10 ENCOUNTER — Other Ambulatory Visit: Payer: Self-pay | Admitting: Cardiology

## 2023-11-13 ENCOUNTER — Other Ambulatory Visit (HOSPITAL_COMMUNITY): Payer: Self-pay

## 2023-11-13 ENCOUNTER — Other Ambulatory Visit: Payer: Self-pay

## 2023-11-13 MED ORDER — RIVAROXABAN 20 MG PO TABS
20.0000 mg | ORAL_TABLET | Freq: Every day | ORAL | 1 refills | Status: AC
  Filled 2023-11-13: qty 90, 90d supply, fill #0
  Filled 2024-02-15: qty 90, 90d supply, fill #1

## 2023-11-13 NOTE — Telephone Encounter (Signed)
 Prescription refill request for Xarelto  received.  Indication: AF Last office visit: 08/24/23  R Revankar MD Weight: 84.7kg Age: 83 Scr: 1.17 on 11/21/22  Epic CrCl: 58.32  Based on above findings Xarelto  20mg  daily is the appropriate dose.  Refill approved.

## 2023-11-14 ENCOUNTER — Other Ambulatory Visit: Payer: Self-pay

## 2023-11-21 ENCOUNTER — Other Ambulatory Visit (HOSPITAL_COMMUNITY): Payer: Self-pay

## 2023-11-21 ENCOUNTER — Other Ambulatory Visit: Payer: Self-pay

## 2023-11-21 DIAGNOSIS — I4819 Other persistent atrial fibrillation: Secondary | ICD-10-CM | POA: Diagnosis not present

## 2023-11-21 DIAGNOSIS — N1831 Chronic kidney disease, stage 3a: Secondary | ICD-10-CM | POA: Diagnosis not present

## 2023-11-21 DIAGNOSIS — Z23 Encounter for immunization: Secondary | ICD-10-CM | POA: Diagnosis not present

## 2023-11-21 DIAGNOSIS — L309 Dermatitis, unspecified: Secondary | ICD-10-CM | POA: Diagnosis not present

## 2023-11-21 DIAGNOSIS — E782 Mixed hyperlipidemia: Secondary | ICD-10-CM | POA: Diagnosis not present

## 2023-11-21 DIAGNOSIS — I129 Hypertensive chronic kidney disease with stage 1 through stage 4 chronic kidney disease, or unspecified chronic kidney disease: Secondary | ICD-10-CM | POA: Diagnosis not present

## 2023-11-21 DIAGNOSIS — N400 Enlarged prostate without lower urinary tract symptoms: Secondary | ICD-10-CM | POA: Diagnosis not present

## 2023-11-21 DIAGNOSIS — E1122 Type 2 diabetes mellitus with diabetic chronic kidney disease: Secondary | ICD-10-CM | POA: Diagnosis not present

## 2023-11-21 MED ORDER — METFORMIN HCL 500 MG PO TABS: 500.0000 mg | ORAL_TABLET | Freq: Two times a day (BID) | ORAL | 1 refills | Status: AC

## 2023-11-21 MED ORDER — TAMSULOSIN HCL 0.4 MG PO CAPS: 0.4000 mg | ORAL_CAPSULE | Freq: Every evening | ORAL | 1 refills | Status: AC

## 2023-11-21 MED ORDER — LOSARTAN POTASSIUM 25 MG PO TABS
25.0000 mg | ORAL_TABLET | Freq: Every morning | ORAL | 1 refills | Status: AC
  Filled 2023-11-21: qty 90, 90d supply, fill #0
  Filled 2024-02-15: qty 90, 90d supply, fill #1

## 2023-11-21 MED ORDER — ATORVASTATIN CALCIUM 10 MG PO TABS
10.0000 mg | ORAL_TABLET | ORAL | 1 refills | Status: AC
  Filled 2023-12-14 – 2024-02-15 (×4): qty 26, 91d supply, fill #0
  Filled 2024-03-13: qty 26, 91d supply, fill #1

## 2023-12-14 ENCOUNTER — Other Ambulatory Visit (HOSPITAL_COMMUNITY): Payer: Self-pay

## 2023-12-15 ENCOUNTER — Other Ambulatory Visit (HOSPITAL_COMMUNITY): Payer: Self-pay

## 2023-12-15 ENCOUNTER — Other Ambulatory Visit: Payer: Self-pay

## 2023-12-18 ENCOUNTER — Other Ambulatory Visit: Payer: Self-pay

## 2023-12-25 ENCOUNTER — Other Ambulatory Visit (HOSPITAL_COMMUNITY): Payer: Self-pay

## 2023-12-25 ENCOUNTER — Telehealth (HOSPITAL_COMMUNITY): Payer: Self-pay | Admitting: Pharmacist

## 2023-12-25 ENCOUNTER — Ambulatory Visit: Admitting: Podiatry

## 2023-12-25 ENCOUNTER — Other Ambulatory Visit: Payer: Self-pay

## 2023-12-25 ENCOUNTER — Telehealth (HOSPITAL_COMMUNITY): Payer: Self-pay

## 2023-12-25 ENCOUNTER — Encounter: Payer: Self-pay | Admitting: Pharmacist

## 2023-12-25 VITALS — Ht 69.0 in | Wt 186.0 lb

## 2023-12-25 DIAGNOSIS — R21 Rash and other nonspecific skin eruption: Secondary | ICD-10-CM

## 2023-12-25 DIAGNOSIS — L603 Nail dystrophy: Secondary | ICD-10-CM

## 2023-12-25 MED ORDER — TRIAMCINOLONE ACETONIDE 0.1 % EX CREA
1.0000 | TOPICAL_CREAM | Freq: Two times a day (BID) | CUTANEOUS | 1 refills | Status: AC
  Filled 2023-12-25: qty 30, 15d supply, fill #0

## 2023-12-25 MED ORDER — CICLOPIROX 8 % EX SOLN
Freq: Every day | CUTANEOUS | 2 refills | Status: AC
  Filled 2023-12-25: qty 6.6, 30d supply, fill #0

## 2023-12-25 NOTE — Telephone Encounter (Signed)
 Pharmacy Patient Advocate Encounter   Received notification from Pt Calls Messages that prior authorization for Ciclodan 8% solution  is required/requested.   Insurance verification completed.   The patient is insured through [facility] ADVANTAGE/RX ADVANCE.   Per test claim: PA required; PA submitted to above mentioned insurance via Latent Key/confirmation #/[id number] Status is pending

## 2023-12-25 NOTE — Patient Instructions (Addendum)
 Ciclopirox Topical Solution What is this medication? CICLOPIROX (sye kloe PEER ox) treats fungal infections of the nails. It belongs to a group of medications called antifungals. It will not treat infections caused by bacteria or viruses. This medicine may be used for other purposes; ask your health care provider or pharmacist if you have questions. COMMON BRAND NAME(S): Ciclodan Nail Solution, CNL8, Penlac What should I tell my care team before I take this medication? They need to know if you have any of these conditions: Diabetes (high blood sugar) Immune system problems Organ transplant Receiving steroid inhalers, cream, or lotion Seizures Tingling of the fingers or toes or other nerve disorder An unusual or allergic reaction to ciclopirox, other medications, foods, dyes, or preservatives Pregnant or trying to get pregnant Breast-feeding How should I use this medication? This medication is for external use only. Do not take by mouth. Wash your hands before and after use. If you are treating your hands, only wash your hands before use. Do not get it in your eyes. If you do, rinse your eyes with plenty of cool tap water. Use it as directed on the prescription label at the same time every day. Do not use it more often than directed. Use the medication for the full course as directed by your care team, even if you think you are better. Do not stop using it unless your care team tells you to stop it early. Apply a thin film of the medication to the affected area. Talk to your care team about the use of this medication in children. While it may be prescribed for children as young as 12 years for selected conditions, precautions do apply. Overdosage: If you think you have taken too much of this medicine contact a poison control center or emergency room at once. NOTE: This medicine is only for you. Do not share this medicine with others. What if I miss a dose? If you miss a dose, use it as soon as  you can. If it is almost time for your next dose, use only that dose. Do not use double or extra doses. What may interact with this medication? Interactions are not expected. Do not use any other skin products without telling your care team. This list may not describe all possible interactions. Give your health care provider a list of all the medicines, herbs, non-prescription drugs, or dietary supplements you use. Also tell them if you smoke, drink alcohol, or use illegal drugs. Some items may interact with your medicine. What should I watch for while using this medication? Visit your care team for regular checks on your progress. It may be some time before you see the benefit from this medication. Do not use nail polish or other nail cosmetic products on the treated nails. Removal of the unattached, infected nail by your care team is needed with use of this medication. If you have diabetes or numbness in your fingers or toes, talk to your care team about proper nail care. What side effects may I notice from receiving this medication? Side effects that you should report to your care team as soon as possible: Allergic reactions--skin rash, itching, hives, swelling of the face, lips, tongue, or throat Burning, itching, crusting, or peeling of treated skin Side effects that usually do not require medical attention (report to your care team if they continue or are bothersome): Change in nail shape, thickness, or color Mild skin irritation, redness, or dryness This list may not describe all possible side  effects. Call your doctor for medical advice about side effects. You may report side effects to FDA at 1-800-FDA-1088. Where should I keep my medication? Keep out of the reach of children and pets. Store at room temperature between 20 and 25 degrees C (68 and 77 degrees F). This medication is flammable. Avoid exposure to heat, fire, flame, and smoking. Get rid of medications that are no longer needed  or have expired: Take the medication to a medication take-back program. Check with your pharmacy or law enforcement to find a location. If you cannot return the medication, check the label or package insert to see if the medication should be thrown out in the garbage or flushed down the toilet. If you are not sure, ask your care team. If it is safe to put in the trash, take the medication out of the container. Mix the medication with cat litter, dirt, coffee grounds, or other unwanted substance. Seal the mixture in a bag or container. Put it in the trash. NOTE: This sheet is a summary. It may not cover all possible information. If you have questions about this medicine, talk to your doctor, pharmacist, or health care provider.  2024 Elsevier/Gold Standard (2021-05-24 00:00:00)  --  Diabetes Mellitus and Foot Care Diabetes, also called diabetes mellitus, may cause problems with your feet and legs because of poor blood flow (circulation). Poor circulation may make your skin: Become thinner and drier. Break more easily. Heal more slowly. Peel and crack. You may also have nerve damage (neuropathy). This can cause decreased feeling in your legs and feet. This means that you may not notice minor injuries to your feet that could lead to more serious problems. Finding and treating problems early is the best way to prevent future foot problems. How to care for your feet Foot hygiene  Wash your feet daily with warm water and mild soap. Do not use hot water. Then, pat your feet and the areas between your toes until they are fully dry. Do not soak your feet. This can dry your skin. Trim your toenails straight across. Do not dig under them or around the cuticle. File the edges of your nails with an emery board or nail file. Apply a moisturizing lotion or petroleum jelly to the skin on your feet and to dry, brittle toenails. Use lotion that does not contain alcohol and is unscented. Do not apply lotion  between your toes. Shoes and socks Wear clean socks or stockings every day. Make sure they are not too tight. Do not wear knee-high stockings. These may decrease blood flow to your legs. Wear shoes that fit well and have enough cushioning. Always look in your shoes before you put them on to be sure there are no objects inside. To break in new shoes, wear them for just a few hours a day. This prevents injuries on your feet. Wounds, scrapes, corns, and calluses  Check your feet daily for blisters, cuts, bruises, sores, and redness. If you cannot see the bottom of your feet, use a mirror or ask someone for help. Do not cut off corns or calluses or try to remove them with medicine. If you find a minor scrape, cut, or break in the skin on your feet, keep it and the skin around it clean and dry. You may clean these areas with mild soap and water. Do not clean the area with peroxide, alcohol, or iodine. If you have a wound, scrape, corn, or callus on your foot, look at  it several times a day to make sure it is healing and not infected. Check for: Redness, swelling, or pain. Fluid or blood. Warmth. Pus or a bad smell. General tips Do not cross your legs. This may decrease blood flow to your feet. Do not use heating pads or hot water bottles on your feet. They may burn your skin. If you have lost feeling in your feet or legs, you may not know this is happening until it is too late. Protect your feet from hot and cold by wearing shoes, such as at the beach or on hot pavement. Schedule a complete foot exam at least once a year or more often if you have foot problems. Report any cuts, sores, or bruises to your health care provider right away. Where to find more information American Diabetes Association: diabetes.org Association of Diabetes Care & Education Specialists: diabeteseducator.org Contact a health care provider if: You have a condition that increases your risk of infection, and you have any  cuts, sores, or bruises on your feet. You have an injury that is not healing. You have redness on your legs or feet. You feel burning or tingling in your legs or feet. You have pain or cramps in your legs and feet. Your legs or feet are numb. Your feet always feel cold. You have pain around any toenails. Get help right away if: You have a wound, scrape, corn, or callus on your foot and: You have signs of infection. You have a fever. You have a red line going up your leg. This information is not intended to replace advice given to you by your health care provider. Make sure you discuss any questions you have with your health care provider. Document Revised: 07/28/2021 Document Reviewed: 07/28/2021 Elsevier Patient Education  2024 [employer].

## 2023-12-25 NOTE — Telephone Encounter (Signed)
 PA request has been Received. New Encounter has been or will be created for follow up. For additional info see Pharmacy Prior Auth telephone encounter from 12/25/23.

## 2023-12-25 NOTE — Progress Notes (Signed)
 Subjective:   Patient ID: Michael Mejia, male   DOB: 83 y.o.   MRN: [id number]   HPI Chief Complaint  Patient presents with   Nail Problem    RM 12 Patient is here for thickening of the right 3rd toe and a split nail in the right 2nd toe. Pt states nail thickening occurred within the last 4-6 months. Pt state dry peeling skin on the bottom of the left foot.   83 year old male presents the office with concerns of thickening to his right fourth toenail he also notes his second third toes have been splitting in his second toenails then.  He also has a rash on the bottom of his foot on the left side but will occasionally on the right side.  He does have a history of psoriasis.  No other dermatological issues that he reports.  Does not itch.  No open lesions or any drainage.  No pustules he reports.   Review of Systems  All other systems reviewed and are negative.       Objective:  Physical Exam  General: AAO x3, NAD  Dermatological: The right fourth toenail is hypertrophic, dystrophic with yellow, brown discoloration.  There is also dystrophy present to the 2nd and 3rd toenails on the right foot.  There is no pain to the nails there is no swelling redness or drainage.  As pictured below there is a skin rash on the plantar aspect of the foot on the arch as well as the lateral aspect of the foot.  No open lesions or any drainage.  No pustules.       Vascular: Dorsalis Pedis artery and Posterior Tibial artery pedal pulses are palpable bilateral with immedate capillary fill time. There is no pain with calf compression, swelling, warmth, erythema.   Neruologic: Grossly intact via light touch bilateral.   Musculoskeletal: No pain on exam today.       Assessment:   Skin rash; onychomycosis     Plan:  -Treatment options discussed including all alternatives, risks, and complications -Etiology of symptoms were discussed - Prescribed triamcinolone cream for the skin.  If no improvement  consider biopsy -As a courtesy has noted the toenails on the right foot any complications or bleeding.  Discussed different treatment options including oral, topical as well as alternative treatments.  Prescribed ciclopirox topically.  Discussed side effects, duration of use.  Return if symptoms worsen or fail to improve.  Donnice JONELLE Fees DPM

## 2023-12-26 NOTE — Telephone Encounter (Signed)
 Pharmacy Patient Advocate Encounter  Received notification from [facility] [employer] that Prior Authorization for Ciclodan 8% solution has been DENIED.  Full denial letter will be uploaded to the media tab. See denial reason below.  The reason we denied your request is because the following prior authorization criteria were not met:   Diagnosis of onychomycosis has been confirmed by one of the following:  Positive potassium hydroxide preparation Culture Histology AND Trial and failure (12 week minimum), contraindication or intolerance to oral terbinafine  Additionally, the requested drug must meet the following therapeutic criteria:   Patient does not have lunula (matrix) involvement AND One of the following: Diagnosis of onychomycosis of the toenails OR  Diagnosis of onychomycosis of the fingernails OR Diagnosis of onychomycosis has been confirmed by one of the following: Positive potassium hydroxide preparation OR Culture OR Histology AND If toenail onychomycosis, patient has mild to moderate disease involving at least 1 target toenail, AND One of the following:  For onychomycosis of fingernails, trial and failure (minimum 6 week trial), contraindication, or intolerance to oral terbinafine OR For onychomycosis of toenails, trial and failure (12 week minimum), contraindication or intolerance to oral terbinafine

## 2023-12-30 DIAGNOSIS — T63481A Toxic effect of venom of other arthropod, accidental (unintentional), initial encounter: Secondary | ICD-10-CM | POA: Diagnosis not present

## 2023-12-30 DIAGNOSIS — H5712 Ocular pain, left eye: Secondary | ICD-10-CM | POA: Diagnosis not present

## 2024-01-02 ENCOUNTER — Other Ambulatory Visit: Payer: Self-pay | Admitting: Podiatry

## 2024-01-02 MED ORDER — CICLOPIROX OLAMINE 0.77 % EX CREA: TOPICAL_CREAM | Freq: Two times a day (BID) | CUTANEOUS | 1 refills | Status: AC

## 2024-02-16 ENCOUNTER — Other Ambulatory Visit: Payer: Self-pay

## 2024-02-16 ENCOUNTER — Other Ambulatory Visit (HOSPITAL_BASED_OUTPATIENT_CLINIC_OR_DEPARTMENT_OTHER): Payer: Self-pay

## 2024-03-14 ENCOUNTER — Other Ambulatory Visit (HOSPITAL_BASED_OUTPATIENT_CLINIC_OR_DEPARTMENT_OTHER): Payer: Self-pay
